# Patient Record
Sex: Male | Born: 1975 | Race: White | Hispanic: No | Marital: Married | Smoking: Current every day smoker
Health system: Southern US, Community
[De-identification: ages and names within clinical notes are randomized; demographics above are authoritative.]

## PROBLEM LIST (undated history)

## (undated) DIAGNOSIS — M7041 Prepatellar bursitis, right knee: Secondary | ICD-10-CM

## (undated) DIAGNOSIS — E119 Type 2 diabetes mellitus without complications: Secondary | ICD-10-CM

## (undated) DIAGNOSIS — D751 Secondary polycythemia: Secondary | ICD-10-CM

## (undated) DIAGNOSIS — K7011 Alcoholic hepatitis with ascites: Secondary | ICD-10-CM

## (undated) DIAGNOSIS — F1020 Alcohol dependence, uncomplicated: Secondary | ICD-10-CM

## (undated) HISTORY — PX: WISDOM TOOTH EXTRACTION: SHX21

## (undated) HISTORY — PX: TONSILLECTOMY AND ADENOIDECTOMY: SHX28

---

## 2001-06-09 ENCOUNTER — Encounter: Payer: Self-pay | Admitting: Emergency Medicine

## 2001-06-09 ENCOUNTER — Emergency Department (HOSPITAL_COMMUNITY): Admission: EM | Admit: 2001-06-09 | Discharge: 2001-06-09 | Payer: Self-pay | Admitting: Emergency Medicine

## 2010-02-20 ENCOUNTER — Encounter (INDEPENDENT_AMBULATORY_CARE_PROVIDER_SITE_OTHER): Payer: Self-pay | Admitting: *Deleted

## 2010-02-20 ENCOUNTER — Emergency Department (HOSPITAL_COMMUNITY)
Admission: EM | Admit: 2010-02-20 | Discharge: 2010-02-21 | Payer: Self-pay | Source: Home / Self Care | Admitting: Emergency Medicine

## 2010-02-22 ENCOUNTER — Encounter (INDEPENDENT_AMBULATORY_CARE_PROVIDER_SITE_OTHER): Payer: Self-pay | Admitting: *Deleted

## 2010-03-28 ENCOUNTER — Encounter (INDEPENDENT_AMBULATORY_CARE_PROVIDER_SITE_OTHER): Payer: Self-pay | Admitting: *Deleted

## 2010-03-31 ENCOUNTER — Encounter: Payer: Self-pay | Admitting: Gastroenterology

## 2010-03-31 ENCOUNTER — Ambulatory Visit
Admission: RE | Admit: 2010-03-31 | Discharge: 2010-03-31 | Payer: Self-pay | Source: Home / Self Care | Attending: Gastroenterology | Admitting: Gastroenterology

## 2010-03-31 DIAGNOSIS — E119 Type 2 diabetes mellitus without complications: Secondary | ICD-10-CM | POA: Insufficient documentation

## 2010-03-31 DIAGNOSIS — K219 Gastro-esophageal reflux disease without esophagitis: Secondary | ICD-10-CM | POA: Insufficient documentation

## 2010-03-31 DIAGNOSIS — R1013 Epigastric pain: Secondary | ICD-10-CM | POA: Insufficient documentation

## 2010-03-31 DIAGNOSIS — Z87442 Personal history of urinary calculi: Secondary | ICD-10-CM | POA: Insufficient documentation

## 2010-04-07 NOTE — Letter (Signed)
Summary: New Patient letter  [facility]  [address]   [place], [place] [address]   Phone: [phone]  Fax: [phone]       02/22/2010 MRN: [id number]  Tyler Clarke [address] [place], [place]  [address]  Dear Tyler Clarke,  Welcome to the Gastroenterology Division at [facility].    You are scheduled to see Dr.  Arlyce Dice on 03/31/2010 at 3:00 on the 3rd floor at [facility], [address]  We ask that you try to arrive at our office 15 minutes prior to your appointment time to allow for check-in.  We would like you to complete the enclosed self-administered evaluation form prior to your visit and bring it with you on the day of your appointment.  We will review it with you.  Also, please bring a complete list of all your medications or, if you prefer, bring the medication bottles and we will list them.  Please bring your insurance card so that we may make a copy of it.  If your insurance requires a referral to see a specialist, please bring your referral form from your primary care physician.  Co-payments are due at the time of your visit and may be paid by cash, check or credit card.     Your office visit will consist of a consult with your physician (includes a physical exam), any laboratory testing he/she may order, scheduling of any necessary diagnostic testing (e.g. x-ray, ultrasound, CT-scan), and scheduling of a procedure (e.g. Endoscopy, Colonoscopy) if required.  Please allow enough time on your schedule to allow for any/all of these possibilities.    If you cannot keep your appointment, please call [phone] to cancel or reschedule prior to your appointment date.  This allows [place] the opportunity to schedule an appointment for another patient in need of care.  If you do not cancel or reschedule by 5 p.m. the business day prior to your appointment date, you will be charged a $50.00 late cancellation/no-show fee.    Thank you for choosing  [facility] for your medical needs.  We appreciate the opportunity to care for you.  Please visit [place] at our website  to learn more about our practice.                     Sincerely,                                                             The Gastroenterology Division

## 2010-04-07 NOTE — Letter (Signed)
Summary: [facility]: Immunoassay Fecal Occult Blood (iFOB) Order [facility]  [address] [place], [place] [address]   Phone: [phone]  Fax: [phone]      [facility]: Immunoassay Fecal Occult Blood (iFOB) Order Form   March 31, 2010 MRN: [id number]   Tyler Clarke 09-Sep-1975   Physicican Name:Draken Farrior Diagnosis Code:789.06     Merri Ray CMA (AAMA)

## 2010-04-07 NOTE — Letter (Signed)
Summary: Results Letter  [facility]  [address] [place], [place] [address]   Phone: [phone]  Fax: [phone]        March 31, 2010 MRN: [id number]    Tyler Clarke [address] [place], [place]  [address]    Dear [name],  It is my pleasure to have treated you recently as a new patient in my office. I appreciate your confidence and the opportunity to participate in your care.  Since I do have a busy inpatient endoscopy schedule and office schedule, my office hours vary weekly. I am, however, available for emergency calls everyday through my office. If I am not available for an urgent office appointment, another one of our gastroenterologist will be able to assist you.  My well-trained staff are prepared to help you at all times. For emergencies after office hours, a physician from our Gastroenterology section is always available through my 24 hour answering service  Once again I welcome you as a new patient and I look forward to a happy and healthy relationship             Sincerely,  Tyler Meckel MD  This letter has been electronically signed by your physician.  Appended Document: Results Letter letter mailed

## 2010-04-13 NOTE — Assessment & Plan Note (Signed)
Summary: ABD PAIN ?ULCER.Tyler Clarke.   History of Present Illness Visit Type: new patient  Primary GI MD: Melvia Heaps MD [facility] Primary Provider: Blair Heys, MD  Requesting Provider: na Chief Complaint: Pt c/o epigastric abd pain, GERD, and vomiting blood  History of Present Illness:   Mr. Tyler Clarke is a pleasant 35 year old white male referred at the request of Dr. Manus Gunning for evaluation of abdominal pain and hematemesis.  Approximately a month ago he was seen at the ER  for moderately severe intermittent upper bowel pain.  Pain was  associated with nausea and vomiting.  He says that he was vomiting blood.  At that time had been taking Aleve at least twice a day.  Aleve was discontinued and he was placed on Prilosec.  Pain and vomiting have subsided.  He is now symptom-free.  He was having pyrosis as well.  Stools never changed in color.  There is no history of melena or hematochezia.   GI Review of Systems    Reports abdominal pain, acid reflux, heartburn, and  vomiting blood.     Location of  Abdominal pain: epigastric area.    Denies belching, bloating, chest pain, dysphagia with liquids, dysphagia with solids, loss of appetite, nausea, vomiting, weight loss, and  weight gain.        Denies anal fissure, black tarry stools, change in bowel habit, constipation, diarrhea, diverticulosis, fecal incontinence, heme positive stool, hemorrhoids, irritable bowel syndrome, jaundice, light color stool, liver problems, rectal bleeding, and  rectal pain.    Current Medications (verified): 1)  Prilosec Otc 20 Mg Tbec (Omeprazole Magnesium) .... 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  Alcohol Use - yes: 6 beers daily  Illicit Drug Use - no Smoking Status:  current Drug Use:  no  Review of Systems       The patient complains of coughing up blood.  The patient denies allergy/sinus, anemia, anxiety-new, arthritis/joint pain, back pain, blood in urine, breast changes/lumps, change in vision, confusion, cough, depression-new, fainting, fatigue, fever, headaches-new, hearing problems, heart murmur, heart rhythm changes, itching, menstrual pain, muscle pains/cramps, night sweats, nosebleeds, pregnancy symptoms, shortness of breath, skin rash, sleeping problems, sore throat, swelling of feet/legs, swollen lymph glands, thirst - excessive , urination - excessive , urination changes/pain, urine leakage, vision changes, and voice change.         All other systems were reviewed and were negative   Vital Signs:  Patient profile:   34 year old male Height:      74 inches Weight:      221 pounds BMI:     28.48 BSA:     2.27 Pulse rate:   92 / minute Pulse rhythm:   regular BP sitting:   136 / 84  (left arm) Cuff size:   regular  Vitals Entered By: Ok Anis CMA (March 31, 2010 3:15 PM)  Physical Exam  Additional Exam:  On physical exam he is a well-developed well-nourished male  skin: anicteric HEENT: normocephalic; PEERLA; no nasal or pharyngeal abnormalities neck: supple nodes: no cervical lymphadenopathy chest: clear to ausculatation and percussion heart: no murmurs, gallops, or rubs abd: soft, nontender; BS normoactive; no abdominal masses, organomegaly; there is minimal tenderness  in the upper midepigastrium rectal: deferred ext: no cynanosis, clubbing, edema skeletal: no deformities neuro: oriented x 3; no focal abnormalities    Impression & Recommendations:  Problem # 1:  EPIGASTRIC PAIN (ICD-789.06) Assessment Improved  Symptoms certainly may have been due to active peptic disease,  especially in view of his NSAID use.  Recommendations #1 complete 6 week  course of Prilosec #2 check stool for H. pylori antigen #3 check stool Hemoccults; if positive I will schedule upper endoscopy  Orders: T-Stool for Helicobacter Pylori ([id number])  Problem # 2:  GERD (ICD-530.81) He is symptom-free  on Prilosec.  After 6 weeks of therapy he will attempt to discontinue this.  Patient Instructions: 1)  Copy sent to : Blair Heys, MD  2)  You will go to the basement for labs 3)  The medication list was reviewed and reconciled.  All changed / newly prescribed medications were explained.  A complete medication list was provided to the patient / caregiver.

## 2010-05-16 LAB — CBC
HCT: 53.8 % — ABNORMAL HIGH (ref 39.0–52.0)
Hemoglobin: 19.6 g/dL — ABNORMAL HIGH (ref 13.0–17.0)
MCH: 36.5 pg — ABNORMAL HIGH (ref 26.0–34.0)
MCHC: 36.4 g/dL — ABNORMAL HIGH (ref 30.0–36.0)
MCV: 100.2 fL — ABNORMAL HIGH (ref 78.0–100.0)
Platelets: 183 10*3/uL (ref 150–400)
RBC: 5.37 MIL/uL (ref 4.22–5.81)
RDW: 11.7 % (ref 11.5–15.5)
WBC: 9.6 10*3/uL (ref 4.0–10.5)

## 2010-05-16 LAB — LIPASE, BLOOD: Lipase: 35 U/L (ref 11–59)

## 2010-05-16 LAB — COMPREHENSIVE METABOLIC PANEL
ALT: 48 U/L (ref 0–53)
AST: 53 U/L — ABNORMAL HIGH (ref 0–37)
Albumin: 3.7 g/dL (ref 3.5–5.2)
Chloride: 108 mEq/L (ref 96–112)
Creatinine, Ser: 0.79 mg/dL (ref 0.4–1.5)
GFR calc Af Amer: >60 mL/min (ref >60)
Sodium: 140 mEq/L (ref 135–145)
Total Bilirubin: 0.7 mg/dL (ref 0.3–1.2)

## 2010-05-16 LAB — DIFFERENTIAL
Basophils Absolute: 0 K/uL (ref 0.0–0.1)
Basophils Relative: 0 % (ref 0–1)
Eosinophils Absolute: 0.1 K/uL (ref 0.0–0.7)
Eosinophils Relative: 1 % (ref 0–5)
Lymphocytes Relative: 26 % (ref 12–46)
Lymphs Abs: 2.4 K/uL (ref 0.7–4.0)
Monocytes Absolute: 0.8 K/uL (ref 0.1–1.0)
Monocytes Relative: 8 % (ref 3–12)
Neutro Abs: 6.2 K/uL (ref 1.7–7.7)
Neutrophils Relative %: 65 % (ref 43–77)

## 2010-05-16 LAB — URINALYSIS, ROUTINE W REFLEX MICROSCOPIC
Bilirubin Urine: NEGATIVE
Glucose, UA: NEGATIVE mg/dL
Hgb urine dipstick: NEGATIVE
Ketones, ur: NEGATIVE mg/dL
Nitrite: NEGATIVE
Protein, ur: NEGATIVE mg/dL
Specific Gravity, Urine: 1.001 — ABNORMAL LOW (ref 1.005–1.030)
Urobilinogen, UA: 0.2 mg/dL (ref 0.0–1.0)
pH: 6.5 (ref 5.0–8.0)

## 2010-05-16 LAB — HEMOCCULT GUIAC POC 1CARD (OFFICE): Fecal Occult Bld: NEGATIVE

## 2010-06-03 ENCOUNTER — Other Ambulatory Visit: Payer: Self-pay

## 2010-06-03 DIAGNOSIS — R1013 Epigastric pain: Secondary | ICD-10-CM

## 2011-11-06 ENCOUNTER — Encounter (HOSPITAL_COMMUNITY): Payer: Self-pay | Admitting: Emergency Medicine

## 2011-11-06 ENCOUNTER — Emergency Department (HOSPITAL_COMMUNITY)
Admission: EM | Admit: 2011-11-06 | Discharge: 2011-11-06 | Disposition: A | Discharge status: Home / Self Care | Payer: Self-pay | Attending: Emergency Medicine | Admitting: Emergency Medicine

## 2011-11-06 ENCOUNTER — Emergency Department (HOSPITAL_COMMUNITY): Payer: Self-pay

## 2011-11-06 DIAGNOSIS — F17200 Nicotine dependence, unspecified, uncomplicated: Secondary | ICD-10-CM | POA: Insufficient documentation

## 2011-11-06 DIAGNOSIS — E119 Type 2 diabetes mellitus without complications: Secondary | ICD-10-CM | POA: Insufficient documentation

## 2011-11-06 DIAGNOSIS — M549 Dorsalgia, unspecified: Secondary | ICD-10-CM | POA: Insufficient documentation

## 2011-11-06 MED ORDER — CYCLOBENZAPRINE HCL 10 MG PO TABS: 10.0000 mg | ORAL_TABLET | Freq: Two times a day (BID) | ORAL | Status: DC | PRN

## 2011-11-06 MED ORDER — OXYCODONE-ACETAMINOPHEN 5-325 MG PO TABS
2.0000 | ORAL_TABLET | Freq: Once | ORAL | Status: AC
  Administered 2011-11-06: 2 via ORAL
  Filled 2011-11-06: qty 2

## 2011-11-06 MED ORDER — IBUPROFEN 400 MG PO TABS
800.0000 mg | ORAL_TABLET | Freq: Once | ORAL | Status: AC
  Administered 2011-11-06: 800 mg via ORAL
  Filled 2011-11-06: qty 2

## 2011-11-06 MED ORDER — OXYCODONE-ACETAMINOPHEN 5-325 MG PO TABS: 2.0000 | ORAL_TABLET | ORAL | Status: DC | PRN

## 2011-11-06 NOTE — ED Notes (Signed)
PT. REPORTS LOW BACK PAIN ONSET TODAY WHILE WORKING AND TWISTED HIS BACK , ALSO REPORTS CHRONIC LOW BACK PAIN DUE TO A FALL IN THE PAST.  STATES PAIN WORSE WITH MOVEMENT AND CERTAIN POSITIONS/ LEGS NUMB.

## 2011-11-06 NOTE — ED Provider Notes (Addendum)
History   This chart was scribed for Tyler Quarry, MD by Gerlean Ren. This patient was seen in room TR05C/TR05C and the patient's care was started at 10:02PM.   CSN: [id number]  Arrival date & time 11/06/11  2006   First MD Initiated Contact with Patient 11/06/11 2129      Chief Complaint  Patient presents with  . Back Pain    (Consider location/radiation/quality/duration/timing/severity/associated sxs/prior treatment) HPI Tyler Clarke is a 36 y.o. male who presents to the Emergency Department complaining of lower back pain that has worsened over past month.  Pt fell down flight of stairs one year ago, but pain has been manageable until past month.  Pain radiates down legs at times, but usually with movement.  Pt reports associated leg numbness associated with radiating pain.  Pain worsened by movement.  Pt takes ibuprofen with mild improvements.  Past Medical History  Diagnosis Date  . Diabetes mellitus     Past Surgical History  Procedure Date  . Tonsillectomy     No family history on file.  History  Substance Use Topics  . Smoking status: Current Everyday Smoker  . Smokeless tobacco: Not on file  . Alcohol Use: Yes      Review of Systems  Allergies  Review of patient's allergies indicates no known allergies.  Home Medications   Current Outpatient Rx  Name Route Sig Dispense Refill  . CYCLOBENZAPRINE HCL 10 MG PO TABS Oral Take 1 tablet (10 mg total) by mouth 2 (two) times daily as needed for muscle spasms. 20 tablet 0  . OXYCODONE-ACETAMINOPHEN 5-325 MG PO TABS Oral Take 2 tablets by mouth every 4 (four) hours as needed for pain. 15 tablet 0    BP 137/92  Pulse 91  Temp 98.3 F (36.8 C) (Oral)  Resp 16  SpO2 95%  Physical Exam  Nursing note and vitals reviewed. Constitutional: He is oriented to person, place, and time. He appears well-developed and well-nourished. No distress.  HENT:  Head: Normocephalic and atraumatic.  Eyes: EOM are normal.    Neck: Neck supple. No tracheal deviation present.  Cardiovascular: Normal rate.   Pulmonary/Chest: Effort normal. No respiratory distress.  Musculoskeletal: Normal range of motion.       Diffuse tenderness in lower lumbar spine.  No redness.  No swelling.  No deformity.  Neurological: He is alert and oriented to person, place, and time. He has normal reflexes. He exhibits normal muscle tone.       Extremity strength 5/5.    Skin: Skin is warm and dry.  Psychiatric: He has a normal mood and affect. His behavior is normal.    ED Course  Procedures (including critical care time) DIAGNOSTIC STUDIES: Oxygen Saturation is 95% on room air, adequate by my interpretation.    COORDINATION OF CARE:    Labs Reviewed - No data to display Dg Lumbar Spine Complete  11/06/2011  *RADIOLOGY REPORT*  Clinical Data: 37 year old male status post fall with back pain. Lower extremity numbness.  LUMBAR SPINE - COMPLETE 4+ VIEW  Comparison: CT abdomen and pelvis 02/20/2010.  Findings: Normal lumbar segmentation.  Stable vertebral height and alignment.  Chronic disc space narrowing at L1-L2 and anterior endplate spurring.  Other disc spaces are relatively preserved.  No pars fracture.  SI joints and sacral ala within normal limits. Bone mineralization is within normal limits.  IMPRESSION: Chronic L1-L2 disc degeneration. No acute osseous abnormality in the lumbar spine.   Original Report Authenticated By: H.LEE  HALL III, M.D.      1. Back pain     I personally performed the services described in this documentation, which was scribed in my presence. The recorded information has been reviewed and considered.   MDM      Patient with history of back injury one year ago with worsening symptoms today.  Patient without any acute focal neuro findings and plan pain management with skeletal muscle relaxants and pain meds.  Patient with history of diet controlled dm and will not add prednisone.  Patient referred to  Dr. Pearletha Forge for further management.   Patient with back pain.  No neurological deficits and normal neuro exam.  Patient can walk but states is painful.  No loss of bowel or bladder control.  No concern for cauda equina.  No fever, night sweats, weight loss, h/o cancer, IVDU.  RICE protocol and pain medicine indicated and discussed with patient.      Tyler Quarry, MD 11/06/11 1610  Tyler Quarry, MD 11/06/11 2257

## 2011-11-08 ENCOUNTER — Encounter: Payer: Self-pay | Admitting: Family Medicine

## 2011-11-08 ENCOUNTER — Ambulatory Visit (INDEPENDENT_AMBULATORY_CARE_PROVIDER_SITE_OTHER): Payer: Self-pay | Admitting: Family Medicine

## 2011-11-08 VITALS — BP 148/96 | HR 103 | Ht 74.0 in | Wt 200.0 lb

## 2011-11-08 DIAGNOSIS — M545 Low back pain: Secondary | ICD-10-CM

## 2011-11-08 MED ORDER — MELOXICAM 15 MG PO TABS: 15.0000 mg | ORAL_TABLET | Freq: Every day | ORAL | Status: DC

## 2011-11-08 MED ORDER — CYCLOBENZAPRINE HCL 10 MG PO TABS: 10.0000 mg | ORAL_TABLET | Freq: Three times a day (TID) | ORAL | Status: DC | PRN

## 2011-11-08 MED ORDER — PREDNISONE (PAK) 10 MG PO TABS: ORAL_TABLET | ORAL | Status: DC

## 2011-11-08 NOTE — Patient Instructions (Addendum)
You have lumbar radiculopathy (a pinched nerve in your low back). A prednisone dose pack is the best option for immediate relief and may be prescribed with transition to an anti-inflammatory like meloxicam daily with food. Percocet as needed for severe pain (no driving on this medicine). Flexeril as needed for muscle spasms (no driving on this medicine). Stay as active as possible. Physical therapy has been shown to be helpful as well. Strengthening of low back muscles, abdominal musculature are key for long term pain relief. If not improving, will consider further imaging (MRI).

## 2011-11-13 ENCOUNTER — Encounter: Payer: Self-pay | Admitting: Family Medicine

## 2011-11-13 DIAGNOSIS — M545 Low back pain, unspecified: Secondary | ICD-10-CM | POA: Insufficient documentation

## 2011-11-13 MED ORDER — CYCLOBENZAPRINE HCL 10 MG PO TABS: 10.0000 mg | ORAL_TABLET | Freq: Three times a day (TID) | ORAL | Status: DC | PRN

## 2011-11-13 NOTE — Assessment & Plan Note (Signed)
2/2 lumbar strain vs lumbar radiculopathy.  Trial prednisone dose pack with transition to mobic.  Flexeril as needed for spasms.  Percocet 7.5 #60 q6h prn severe pain.  PT, home exercise program.  F/u in 4-6 weeks for reevaluation, sooner if not improving as expected.

## 2011-11-13 NOTE — Progress Notes (Addendum)
  Subjective:    Patient ID: Tyler Clarke, male    DOB: 06/18/1975, 36 y.o.   MRN: [id number]  PCP: None listed  HPI 36 yo M here for low back pain.  Patient reports about 1 year ago he fell on wooden steps - hit one very hard with mid-low back. No prior back problems. Has had coming and going pain low back since then and more constant last week. Sometimes feels a knot in low back. Associated numbness in low back as well. Tried flexeril and percocet from ED. Icing. Not tried PT before. No bowel/bladder dysfunction. Some radiation into legs as well.  Past Medical History  Diagnosis Date  . Diabetes mellitus     No current outpatient prescriptions on file prior to visit.    Past Surgical History  Procedure Date  . Tonsillectomy     No Known Allergies  History   Social History  . Marital Status: Single    Spouse Name: N/A    Number of Children: N/A  . Years of Education: N/A   Occupational History  . Not on file.   Social History Main Topics  . Smoking status: Current Everyday Smoker -- 0.8 packs/day    Types: Cigarettes  . Smokeless tobacco: Not on file  . Alcohol Use: Yes  . Drug Use: No  . Sexually Active: Not on file   Other Topics Concern  . Not on file   Social History Narrative  . No narrative on file    Family History  Problem Relation Age of Onset  . Adopted: Yes  . Family history unknown: Yes    BP 148/96  Pulse 103  Ht 6\' 2"  (1.88 m)  Wt 200 lb (90.719 kg)  BMI 25.68 kg/m2  Review of Systems See HPI above.    Objective:   Physical Exam Gen: NAD  Back: No gross deformity, scoliosis. TTP bilateral lumbar paraspinal region.  No midline or bony TTP. FROM with pain on flexion. Strength LEs 5/5 all muscle groups.   2+ MSRs in patellar and achilles tendons, equal bilaterally. Pain in back with SLRs. Sensation intact to light touch bilaterally. Negative logroll bilateral hips Negative fabers and piriformis stretches.      Assessment & Plan:  1. Low back pain - 2/2 lumbar strain vs lumbar radiculopathy.  Trial prednisone dose pack with transition to mobic.  Flexeril as needed for spasms.  Percocet 7.5 #60 q6h prn severe pain.  PT, home exercise program.  F/u in 4-6 weeks for reevaluation, sooner if not improving as expected.  Addendum 10/4:  Called for refill on percocet and flexeril.  Advised to make appointment for follow-up.  Will not refill percocet - tramadol sent instead.  Flexeril as needed also sent.

## 2011-11-13 NOTE — Addendum Note (Signed)
Addended by: Lenda Kelp on: 11/13/2011 03:02 PM   Modules accepted: Orders

## 2011-12-08 MED ORDER — TRAMADOL HCL 50 MG PO TABS: 50.0000 mg | ORAL_TABLET | Freq: Three times a day (TID) | ORAL | Status: DC | PRN

## 2011-12-08 MED ORDER — CYCLOBENZAPRINE HCL 10 MG PO TABS: 10.0000 mg | ORAL_TABLET | Freq: Three times a day (TID) | ORAL | Status: DC | PRN

## 2011-12-08 NOTE — Addendum Note (Signed)
Addended by: Lenda Kelp on: 12/08/2011 12:32 PM   Modules accepted: Orders

## 2011-12-19 ENCOUNTER — Ambulatory Visit: Payer: Self-pay | Admitting: Family Medicine

## 2012-01-04 ENCOUNTER — Ambulatory Visit: Payer: Self-pay | Admitting: Family Medicine

## 2012-08-06 ENCOUNTER — Ambulatory Visit (INDEPENDENT_AMBULATORY_CARE_PROVIDER_SITE_OTHER): Payer: Self-pay | Admitting: Family Medicine

## 2012-08-06 ENCOUNTER — Encounter: Payer: Self-pay | Admitting: Family Medicine

## 2012-08-06 VITALS — BP 144/102 | HR 89 | Ht 75.0 in | Wt 230.0 lb

## 2012-08-06 DIAGNOSIS — M545 Low back pain, unspecified: Secondary | ICD-10-CM

## 2012-08-06 MED ORDER — PREDNISONE (PAK) 10 MG PO TABS: ORAL_TABLET | ORAL | Status: DC

## 2012-08-06 MED ORDER — CYCLOBENZAPRINE HCL 10 MG PO TABS: 10.0000 mg | ORAL_TABLET | Freq: Three times a day (TID) | ORAL | Status: DC | PRN

## 2012-08-06 MED ORDER — MELOXICAM 15 MG PO TABS: 15.0000 mg | ORAL_TABLET | Freq: Every day | ORAL | Status: DC

## 2012-08-06 MED ORDER — OXYCODONE-ACETAMINOPHEN 5-325 MG PO TABS: 1.0000 | ORAL_TABLET | Freq: Four times a day (QID) | ORAL | Status: DC | PRN

## 2012-08-06 NOTE — Patient Instructions (Addendum)
You have lumbar radiculopathy (a pinched nerve in your low back). A prednisone dose pack is the best option for immediate relief and may be prescribed with transition to an anti-inflammatory like meloxicam daily with food. Percocet as needed for severe pain (no driving on this medicine). Flexeril as needed for muscle spasms (no driving on this medicine if it makes you sleepy). Stay as active as possible. Strengthening of low back muscles, abdominal musculature are key for long term pain relief. Do home exercises daily. If not improving, will consider further imaging (MRI) and/or physical therapy - call me if this is the case a couple weeks from now.

## 2012-08-08 ENCOUNTER — Encounter: Payer: Self-pay | Admitting: Family Medicine

## 2012-08-08 NOTE — Progress Notes (Signed)
  Subjective:    Patient ID: Tyler Clarke, male    DOB: [birth year], 37 y.o.   MRN: [id number]  PCP: None listed  HPI  37 yo M here for f/u low back pain.  11/08/11: Patient reports about 1 year ago he fell on wooden steps - hit one very hard with mid-low back. No prior back problems. Has had coming and going pain low back since then and more constant last week. Sometimes feels a knot in low back. Associated numbness in low back as well. Tried flexeril and percocet from ED. Icing. Not tried PT before. No bowel/bladder dysfunction. Some radiation into legs as well.  08/06/12: Patient reports he has overall done well since last visit - took about 1 1/2 months but completely improved. Then over past 3-4 weeks has had worsening low back pain. 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 No actue injury. Has pain in center of low back that goes into right leg with associated numbness. Tried icyhot, ibuprofen, tramadol. No bowel/bladder dysfunction.  Past Medical History  Diagnosis Date  . Diabetes mellitus     Current Outpatient Prescriptions on File Prior to Visit  Medication Sig Dispense Refill  . traMADol (ULTRAM) 50 MG tablet Take 1 tablet (50 mg total) by mouth every 8 (eight) hours as needed for pain.  90 tablet  0   No current facility-administered medications on file prior to visit.    Past Surgical History  Procedure Laterality Date  . Tonsillectomy      No Known Allergies  History   Social History  . Marital Status: Single    Spouse Name: N/A    Number of Children: N/A  . Years of Education: N/A   Occupational History  . Not on file.   Social History Main Topics  . Smoking status: Current Every Day Smoker -- 0.80 packs/day    Types: Cigarettes  . Smokeless tobacco: Not on file  . Alcohol Use: Yes  . Drug Use: No  . Sexually Active: Not on file   Other Topics Concern  . Not on file   Social History Narrative  . No narrative  on file    Family History  Problem Relation Age of Onset  . Adopted: Yes    BP 144/102  Pulse 89  Ht 6\' 3"  (1.905 m)  Wt 230 lb (104.327 kg)  BMI 28.75 kg/m2  Review of Systems  See HPI above.    Objective:   Physical Exam  Gen: NAD  Back: No gross deformity, scoliosis. TTP bilateral lumbar paraspinal region R > L and in midline but no bony TTP. FROM with pain on flexion. Strength LEs 5/5 all muscle groups.   2+ MSRs in patellar and achilles tendons, equal bilaterally. Mild + SLR right, negative left. Sensation intact to light touch bilaterally. Negative logroll bilateral hips Negative fabers and piriformis stretches.    Assessment & Plan:  1. Low back pain - 2/2 lumbar strain vs lumbar radiculopathy.  Now having radiation into right leg with numbness.  Last visit's plan worked well for him so will again rx prednisone with transition to mobic.  Flexeril as needed for spasms.  Percocet 7.5 #60 q6h prn severe pain.  PT, home exercise program.  F/u in 4-6 weeks.

## 2012-08-08 NOTE — Assessment & Plan Note (Signed)
2/2 lumbar strain vs lumbar radiculopathy.  Now having radiation into right leg with numbness.  Last visit's plan worked well for him so will again rx prednisone with transition to mobic.  Flexeril as needed for spasms.  Percocet 7.5 #60 q6h prn severe pain.  PT, home exercise program.  F/u in 4-6 weeks.

## 2012-08-23 ENCOUNTER — Ambulatory Visit (INDEPENDENT_AMBULATORY_CARE_PROVIDER_SITE_OTHER): Payer: Self-pay | Admitting: Family Medicine

## 2012-08-23 ENCOUNTER — Encounter: Payer: Self-pay | Admitting: Family Medicine

## 2012-08-23 VITALS — BP 150/104 | HR 101 | Ht 75.0 in | Wt 220.0 lb

## 2012-08-23 DIAGNOSIS — M545 Low back pain, unspecified: Secondary | ICD-10-CM

## 2012-08-23 MED ORDER — OXYCODONE-ACETAMINOPHEN 5-325 MG PO TABS: 1.0000 | ORAL_TABLET | Freq: Four times a day (QID) | ORAL | Status: DC | PRN

## 2012-08-23 MED ORDER — PREDNISONE (PAK) 10 MG PO TABS: ORAL_TABLET | ORAL | Status: DC

## 2012-08-23 NOTE — Patient Instructions (Signed)
Take a longer course of prednisone for 12 days. Take percocet as needed for severe pain. We will call you the business day following the MRI to go over results and next steps.

## 2012-08-24 ENCOUNTER — Ambulatory Visit (HOSPITAL_BASED_OUTPATIENT_CLINIC_OR_DEPARTMENT_OTHER)
Admission: RE | Admit: 2012-08-24 | Discharge: 2012-08-24 | Disposition: A | Discharge status: Home / Self Care | Payer: Self-pay | Source: Ambulatory Visit | Attending: Family Medicine | Admitting: Family Medicine

## 2012-08-24 DIAGNOSIS — M79609 Pain in unspecified limb: Secondary | ICD-10-CM | POA: Insufficient documentation

## 2012-08-24 DIAGNOSIS — M545 Low back pain, unspecified: Secondary | ICD-10-CM | POA: Insufficient documentation

## 2012-08-24 DIAGNOSIS — M5126 Other intervertebral disc displacement, lumbar region: Secondary | ICD-10-CM | POA: Insufficient documentation

## 2012-08-24 DIAGNOSIS — M51379 Other intervertebral disc degeneration, lumbosacral region without mention of lumbar back pain or lower extremity pain: Secondary | ICD-10-CM | POA: Insufficient documentation

## 2012-08-24 DIAGNOSIS — M5137 Other intervertebral disc degeneration, lumbosacral region: Secondary | ICD-10-CM | POA: Insufficient documentation

## 2012-08-24 DIAGNOSIS — M129 Arthropathy, unspecified: Secondary | ICD-10-CM | POA: Insufficient documentation

## 2012-08-26 ENCOUNTER — Encounter: Payer: Self-pay | Admitting: Family Medicine

## 2012-08-26 NOTE — Addendum Note (Signed)
Addended by: Lenda Kelp on: 08/26/2012 02:38 PM   Modules accepted: Orders

## 2012-08-26 NOTE — Progress Notes (Addendum)
Subjective:    Patient ID: Tyler Clarke, male    DOB: March 21, 1975, 37 y.o.   MRN: [id number]  PCP: None listed  HPI  37 yo M here for f/u low back pain.  11/08/11: Patient reports about 1 year ago he fell on wooden steps - hit one very hard with mid-low back. No prior back problems. Has had coming and going pain low back since then and more constant last week. Sometimes feels a knot in low back. Associated numbness in low back as well. Tried flexeril and percocet from ED. Icing. Not tried PT before. No bowel/bladder dysfunction. Some radiation into legs as well.  08/06/12: Patient reports he has overall done well since last visit - took about 1 1/2 months but completely improved. Then over past 3-4 weeks has had worsening low back pain. 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 No actue injury. Has pain in center of low back that goes into right leg with associated numbness. Tried icyhot, ibuprofen, tramadol. No bowel/bladder dysfunction.  6/20: Patient reports despite medications his pain has persisted. States prednisone did help with his symptoms but they came back just as bad. Doing home exercises. Still taking percocet, meloxicam. Icing and heat. Has numbness going into right leg. No bowel/bladder dysfunction.  Past Medical History  Diagnosis Date  . Diabetes mellitus     Current Outpatient Prescriptions on File Prior to Visit  Medication Sig Dispense Refill  . meloxicam (MOBIC) 15 MG tablet Take 1 tablet (15 mg total) by mouth daily. With food.  Start AFTER finishing prednisone.  30 tablet  1  . traMADol (ULTRAM) 50 MG tablet Take 1 tablet (50 mg total) by mouth every 8 (eight) hours as needed for pain.  90 tablet  0   No current facility-administered medications on file prior to visit.    Past Surgical History  Procedure Laterality Date  . Tonsillectomy      No Known Allergies  History   Social History  . Marital Status:  Single    Spouse Name: N/A    Number of Children: N/A  . Years of Education: N/A   Occupational History  . Not on file.   Social History Main Topics  . Smoking status: Current Every Day Smoker -- 0.80 packs/day    Types: Cigarettes  . Smokeless tobacco: Not on file  . Alcohol Use: Yes  . Drug Use: No  . Sexually Active: Not on file   Other Topics Concern  . Not on file   Social History Narrative  . No narrative on file    Family History  Problem Relation Age of Onset  . Adopted: Yes    BP 150/104  Pulse 101  Ht 6\' 3"  (1.905 m)  Wt 220 lb (99.791 kg)  BMI 27.5 kg/m2  Review of Systems  See HPI above.    Objective:   Physical Exam  Gen: NAD  Back: No gross deformity, scoliosis. Mild TTP bilateral lumbar paraspinal region R > L and in midline but no bony TTP. FROM with pain on flexion and extension. Strength LEs 5/5 all muscle groups.   2+ MSRs in patellar and achilles tendons, equal bilaterally. Mild + SLR right, negative left. Sensation intact to light touch bilaterally. Negative logroll bilateral hips     Assessment & Plan:  1. Low back pain - consistent with lumbar radiculopathy.  Will burst longer course of prednisone.  Order MRI and consider ESIs if not improving  with longer prednisone course.  See instructions for further.  Addendum 6/23:  MRI results reviewed and discussed with patient - he does have a disc protrusion at L4-5 that appears to displace the right L4 nerve root - based on his exam feel this is most likely cause of his pain.  Will move forward with ESIs at this level.

## 2012-08-26 NOTE — Assessment & Plan Note (Signed)
consistent with lumbar radiculopathy.  Will burst longer course of prednisone.  Order MRI and consider ESIs if not improving with longer prednisone course.  See instructions for further.

## 2012-08-27 ENCOUNTER — Other Ambulatory Visit: Payer: Self-pay | Admitting: Family Medicine

## 2012-08-27 DIAGNOSIS — M545 Low back pain: Secondary | ICD-10-CM

## 2012-08-29 ENCOUNTER — Ambulatory Visit
Admission: RE | Admit: 2012-08-29 | Discharge: 2012-08-29 | Disposition: A | Discharge status: Home / Self Care | Payer: No Typology Code available for payment source | Source: Ambulatory Visit | Attending: Family Medicine | Admitting: Family Medicine

## 2012-08-29 VITALS — BP 143/87 | HR 90

## 2012-08-29 DIAGNOSIS — M545 Low back pain: Secondary | ICD-10-CM

## 2012-08-29 MED ORDER — IOHEXOL 180 MG/ML  SOLN
1.0000 mL | Freq: Once | INTRAMUSCULAR | Status: AC | PRN
  Administered 2012-08-29: 1 mL via EPIDURAL

## 2012-08-29 MED ORDER — METHYLPREDNISOLONE ACETATE 40 MG/ML INJ SUSP (RADIOLOG
120.0000 mg | Freq: Once | INTRAMUSCULAR | Status: AC
  Administered 2012-08-29: 120 mg via EPIDURAL

## 2012-09-16 ENCOUNTER — Ambulatory Visit (INDEPENDENT_AMBULATORY_CARE_PROVIDER_SITE_OTHER): Payer: Self-pay | Admitting: Family Medicine

## 2012-09-16 ENCOUNTER — Encounter: Payer: Self-pay | Admitting: Family Medicine

## 2012-09-16 VITALS — BP 159/112 | HR 114 | Ht 75.0 in | Wt 225.0 lb

## 2012-09-16 DIAGNOSIS — M545 Low back pain, unspecified: Secondary | ICD-10-CM

## 2012-09-16 MED ORDER — OXYCODONE-ACETAMINOPHEN 5-325 MG PO TABS: 1.0000 | ORAL_TABLET | Freq: Four times a day (QID) | ORAL | Status: AC | PRN

## 2012-09-16 MED ORDER — CYCLOBENZAPRINE HCL 10 MG PO TABS: 10.0000 mg | ORAL_TABLET | Freq: Three times a day (TID) | ORAL | Status: DC | PRN

## 2012-09-16 NOTE — Patient Instructions (Addendum)
Call us when you know you're coming back and we will set up the shot to be done the day afterwards. You can also ask them about the third and final shot of the series while you're there (you may not need [place] to send a referral again for a third shot). Flexeril and oxycodone as needed. Follow up will depend on how you do with the shots.

## 2012-09-16 NOTE — Assessment & Plan Note (Signed)
consistent with lumbar radiculopathy, confirmed by MRI at L4-5 level displacing right L4 nerve root.  Did well with ESI but did not last more than a week.  Will repeat this when he returns to town - advised to call us when he knows a return date and we'll set this up.  Refilled flexeril and percocet.

## 2012-09-16 NOTE — Progress Notes (Signed)
Subjective:    Patient ID: Tyler Clarke, male    DOB: 02-07-1976, 37 y.o.   MRN: [id number]  PCP: None listed  HPI  37 yo M here for f/u low back pain.  11/08/11: Patient reports about 1 year ago he fell on wooden steps - hit one very hard with mid-low back. No prior back problems. Has had coming and going pain low back since then and more constant last week. Sometimes feels a knot in low back. Associated numbness in low back as well. Tried flexeril and percocet from ED. Icing. Not tried PT before. No bowel/bladder dysfunction. Some radiation into legs as well.  08/06/12: Patient reports he has overall done well since last visit - took about 1 1/2 months but completely improved. Then over past 3-4 weeks has had worsening low back pain. 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 No actue injury. Has pain in center of low back that goes into right leg with associated numbness. Tried icyhot, ibuprofen, tramadol. No bowel/bladder dysfunction.  6/20: Patient reports despite medications his pain has persisted. States prednisone did help with his symptoms but they came back just as bad. Doing home exercises. Still taking percocet, meloxicam. Icing and heat. Has numbness going into right leg. No bowel/bladder dysfunction.  7/14: Patient reports ESI helped him significantly but only for 1 week. Feels like he's back to where he was initially. Numbness and radiation into right leg. Taking percocet, meloxicam.  Not much lasting relief with the two prednisone dose packs. Is going out of town for 2 weeks to work in [place] but will then be back. Has applied for and gotten [employer] that will kick in within a couple weeks. No bowel/bladder dysfunction.  Past Medical History  Diagnosis Date  . Diabetes mellitus     Current Outpatient Prescriptions on File Prior to Visit  Medication Sig Dispense Refill  . meloxicam (MOBIC) 15 MG tablet Take 1 tablet  (15 mg total) by mouth daily. With food.  Start AFTER finishing prednisone.  30 tablet  1  . predniSONE (STERAPRED UNI-PAK) 10 MG tablet 6 tabs po day 1-2, 5 tabs po day 3-4, 4 tabs po day 5-6, 3 tabs po day 7-8, 2 tabs po day 9-10, 1 tab po day 11-12  42 tablet  0   No current facility-administered medications on file prior to visit.    Past Surgical History  Procedure Laterality Date  . Tonsillectomy      No Known Allergies  History   Social History  . Marital Status: Single    Spouse Name: N/A    Number of Children: N/A  . Years of Education: N/A   Occupational History  . Not on file.   Social History Main Topics  . Smoking status: Current Every Day Smoker -- 0.50 packs/day    Types: Cigarettes  . Smokeless tobacco: Not on file  . Alcohol Use: Yes  . Drug Use: No  . Sexually Active: Not on file   Other Topics Concern  . Not on file   Social History Narrative  . No narrative on file    Family History  Problem Relation Age of Onset  . Adopted: Yes    BP 159/112  Pulse 114  Ht 6\' 3"  (1.905 m)  Wt 225 lb (102.059 kg)  BMI 28.12 kg/m2  Review of Systems  See HPI above.    Objective:   Physical Exam  Gen: NAD  Back: No  gross deformity, scoliosis. Mild TTP bilateral lumbar paraspinal region R > L and in midline but no bony TTP. FROM with pain on flexion and extension. Strength LEs 5/5 all muscle groups.   2+ MSRs in patellar and achilles tendons, equal bilaterally. Mild + SLR right, negative left. Sensation intact to light touch bilaterally. Negative logroll bilateral hips    Assessment & Plan:  1. Low back pain - consistent with lumbar radiculopathy, confirmed by MRI at L4-5 level displacing right L4 nerve root.  Did well with ESI but did not last more than a week.  Will repeat this when he returns to town - advised to call us when he knows a return date and we'll set this up.  Refilled flexeril and percocet.

## 2012-10-18 ENCOUNTER — Other Ambulatory Visit: Payer: Self-pay | Admitting: *Deleted

## 2012-10-18 ENCOUNTER — Telehealth: Payer: Self-pay | Admitting: Family Medicine

## 2012-10-18 ENCOUNTER — Other Ambulatory Visit: Payer: Self-pay | Admitting: Family Medicine

## 2012-10-18 DIAGNOSIS — M549 Dorsalgia, unspecified: Secondary | ICD-10-CM

## 2012-10-18 DIAGNOSIS — M545 Low back pain: Secondary | ICD-10-CM

## 2012-10-18 MED ORDER — HYDROCODONE-ACETAMINOPHEN 5-325 MG PO TABS: 1.0000 | ORAL_TABLET | Freq: Four times a day (QID) | ORAL | Status: DC | PRN

## 2012-10-18 NOTE — Progress Notes (Signed)
Pt referred for repeat ESI, vicodin sent to [facility] pharmacy per Dr. Pearletha Forge.  Pt notified that [facility] will call to set him up for ESI.

## 2012-10-25 ENCOUNTER — Inpatient Hospital Stay: Admission: RE | Admit: 2012-10-25 | Payer: Self-pay | Source: Ambulatory Visit

## 2012-10-31 ENCOUNTER — Ambulatory Visit
Admission: RE | Admit: 2012-10-31 | Discharge: 2012-10-31 | Disposition: A | Discharge status: Home / Self Care | Payer: No Typology Code available for payment source | Source: Ambulatory Visit | Attending: Family Medicine | Admitting: Family Medicine

## 2012-10-31 ENCOUNTER — Other Ambulatory Visit: Payer: Self-pay | Admitting: Family Medicine

## 2012-10-31 VITALS — BP 133/84 | HR 92

## 2012-10-31 DIAGNOSIS — M549 Dorsalgia, unspecified: Secondary | ICD-10-CM

## 2012-10-31 MED ORDER — METHYLPREDNISOLONE ACETATE 40 MG/ML INJ SUSP (RADIOLOG
120.0000 mg | Freq: Once | INTRAMUSCULAR | Status: AC
  Administered 2012-10-31: 120 mg via EPIDURAL

## 2012-10-31 MED ORDER — IOHEXOL 180 MG/ML  SOLN
1.0000 mL | Freq: Once | INTRAMUSCULAR | Status: AC | PRN
  Administered 2012-10-31: 1 mL via EPIDURAL

## 2012-11-14 ENCOUNTER — Other Ambulatory Visit: Payer: Self-pay | Admitting: *Deleted

## 2012-11-14 ENCOUNTER — Telehealth: Payer: Self-pay | Admitting: Family Medicine

## 2012-11-14 MED ORDER — TRAMADOL HCL 50 MG PO TABS: 50.0000 mg | ORAL_TABLET | Freq: Three times a day (TID) | ORAL | Status: DC | PRN

## 2012-11-14 NOTE — Telephone Encounter (Signed)
Would have to step down to tramadol from this - 1 tab every 8 hours as needed for severe pain, #90 with no refills.  Cannot refill after this one.

## 2013-02-04 ENCOUNTER — Ambulatory Visit (INDEPENDENT_AMBULATORY_CARE_PROVIDER_SITE_OTHER): Payer: 59 | Admitting: Family Medicine

## 2013-02-04 ENCOUNTER — Encounter: Payer: Self-pay | Admitting: Family Medicine

## 2013-02-04 VITALS — BP 144/81 | HR 108 | Ht 74.0 in | Wt 240.0 lb

## 2013-02-04 DIAGNOSIS — M545 Low back pain: Secondary | ICD-10-CM

## 2013-02-04 MED ORDER — CYCLOBENZAPRINE HCL 10 MG PO TABS: 10.0000 mg | ORAL_TABLET | Freq: Three times a day (TID) | ORAL | Status: AC | PRN

## 2013-02-04 MED ORDER — HYDROCODONE-ACETAMINOPHEN 5-325 MG PO TABS: 1.0000 | ORAL_TABLET | Freq: Four times a day (QID) | ORAL | Status: DC | PRN

## 2013-02-04 NOTE — Patient Instructions (Signed)
We will refer you to neurosurgery at this point.

## 2013-02-06 ENCOUNTER — Encounter: Payer: Self-pay | Admitting: Family Medicine

## 2013-02-06 NOTE — Assessment & Plan Note (Signed)
consistent with lumbar radiculopathy, confirmed by MRI at L4-5 level displacing right L4 nerve root.  Unfortunately continues to struggle with pain despite conservative measures - prednisone, exercise program, ESIs, tramadol, norco.  Advised at this point we go ahead with referral to neurosurgery to discuss possible microdiscectomy.  Refilled norco and flexeril.

## 2013-02-06 NOTE — Progress Notes (Signed)
Patient ID: RAYSHUN KANDLER, male   DOB: 02/01/1976, 37 y.o.   MRN: [id number]  Subjective:    Patient ID: MACEO HERNAN, male    DOB: [birth year], 37 y.o.   MRN: [id number]  PCP: None listed  Back Pain   37 yo M here for f/u low back pain.  11/08/11: Patient reports about 1 year ago he fell on wooden steps - hit one very hard with mid-low back. No prior back problems. Has had coming and going pain low back since then and more constant last week. Sometimes feels a knot in low back. Associated numbness in low back as well. Tried flexeril and percocet from ED. Icing. Not tried PT before. No bowel/bladder dysfunction. Some radiation into legs as well.  08/06/12: Patient reports he has overall done well since last visit - took about 1 1/2 months but completely improved. Then over past 3-4 weeks has had worsening low back pain. 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 No actue injury. Has pain in center of low back that goes into right leg with associated numbness. Tried icyhot, ibuprofen, tramadol. No bowel/bladder dysfunction.  6/20: Patient reports despite medications his pain has persisted. States prednisone did help with his symptoms but they came back just as bad. Doing home exercises. Still taking percocet, meloxicam. Icing and heat. Has numbness going into right leg. No bowel/bladder dysfunction.  7/14: Patient reports ESI helped him significantly but only for 1 week. Feels like he's back to where he was initially. Numbness and radiation into right leg. Taking percocet, meloxicam.  Not much lasting relief with the two prednisone dose packs. Is going out of town for 2 weeks to work in [place] but will then be back. Has applied for and gotten [employer] that will kick in within a couple weeks. No bowel/bladder dysfunction.  12/2: Patient reports second ESI also only worked for about 1 week. Pain worsening with the cold weather. Less  radiation into right leg than before however. Pain level is an 8/10. Taking ibuprofen and still doing HEP. No bowel/bladder dysfunction. Pain only in lower back currently.  Past Medical History  Diagnosis Date  . Diabetes mellitus     Current Outpatient Prescriptions on File Prior to Visit  Medication Sig Dispense Refill  . meloxicam (MOBIC) 15 MG tablet Take 1 tablet (15 mg total) by mouth daily. With food.  Start AFTER finishing prednisone.  30 tablet  1   No current facility-administered medications on file prior to visit.    Past Surgical History  Procedure Laterality Date  . Tonsillectomy      No Known Allergies  History   Social History  . Marital Status: Single    Spouse Name: N/A    Number of Children: N/A  . Years of Education: N/A   Occupational History  . Not on file.   Social History Main Topics  . Smoking status: Current Every Day Smoker -- 0.50 packs/day    Types: Cigarettes  . Smokeless tobacco: Not on file     Comment: 3-4 cigarettes per day  . Alcohol Use: Yes  . Drug Use: No  . Sexual Activity: Not on file   Other Topics Concern  . Not on file   Social History Narrative  . No narrative on file    Family History  Problem Relation Age of Onset  . Adopted: Yes    BP 144/81  Pulse 108  Ht 6\' 2"  (1.88 m)  Wt 240 lb (108.863 kg)  BMI 30.80 kg/m2  Review of Systems  Musculoskeletal: Positive for back pain.   See HPI above.    Objective:   Physical Exam Gen: NAD  Back: No gross deformity, scoliosis. Minimal TTP bilateral lumbar paraspinal region R > L and in midline but no bony TTP. FROM with pain on flexion and extension. Strength LEs 5/5 all muscle groups.   2+ MSRs in patellar and achilles tendons, equal bilaterally. Negative SLRs. Sensation intact to light touch bilaterally. Negative logroll bilateral hips    Assessment & Plan:  1. Low back pain - consistent with lumbar radiculopathy, confirmed by MRI at L4-5 level  displacing right L4 nerve root.  Unfortunately continues to struggle with pain despite conservative measures - prednisone, exercise program, ESIs, tramadol, norco.  Advised at this point we go ahead with referral to neurosurgery to discuss possible microdiscectomy.  Refilled norco and flexeril.

## 2013-03-17 ENCOUNTER — Ambulatory Visit: Payer: 59

## 2013-03-17 ENCOUNTER — Ambulatory Visit (INDEPENDENT_AMBULATORY_CARE_PROVIDER_SITE_OTHER): Payer: 59 | Admitting: Emergency Medicine

## 2013-03-17 VITALS — BP 130/90 | HR 92 | Temp 99.7°F | Resp 16 | Ht 75.0 in | Wt 229.0 lb

## 2013-03-17 DIAGNOSIS — S20212A Contusion of left front wall of thorax, initial encounter: Secondary | ICD-10-CM

## 2013-03-17 DIAGNOSIS — Z23 Encounter for immunization: Secondary | ICD-10-CM

## 2013-03-17 DIAGNOSIS — R0781 Pleurodynia: Secondary | ICD-10-CM

## 2013-03-17 DIAGNOSIS — S20219A Contusion of unspecified front wall of thorax, initial encounter: Secondary | ICD-10-CM

## 2013-03-17 DIAGNOSIS — R079 Chest pain, unspecified: Secondary | ICD-10-CM

## 2013-03-17 MED ORDER — HYDROCODONE-ACETAMINOPHEN 5-325 MG PO TABS: 1.0000 | ORAL_TABLET | Freq: Four times a day (QID) | ORAL | Status: DC | PRN

## 2013-03-17 NOTE — Patient Instructions (Signed)
Rib Contusion A rib contusion (bruise) can occur by a blow to the chest or by a fall against a hard object. Usually these will be much better in a couple weeks. If X-rays were taken today and there are no broken bones (fractures), the diagnosis of bruising is made. However, broken ribs may not show up for several days, or may be discovered later on a routine X-ray when signs of healing show up. If this happens to you, it does not mean that something was missed on the X-ray, but simply that it did not show up on the first X-rays. Earlier diagnosis will not usually change the treatment. HOME CARE INSTRUCTIONS   Avoid strenuous activity. Be careful during activities and avoid bumping the injured ribs. Activities that pull on the injured ribs and cause pain should be avoided, if possible.  For the first day or two, an ice pack used every 20 minutes while awake may be helpful. Put ice in a plastic bag and put a towel between the bag and the skin.  Eat a normal, well-balanced diet. Drink plenty of fluids to avoid constipation.  Take deep breaths several times a day to keep lungs free of infection. Try to cough several times a day. Splint the injured area with a pillow while coughing to ease pain. Coughing can help prevent pneumonia.  Wear a rib belt or binder only if told to do so by your caregiver. If you are wearing a rib belt or binder, you must do the breathing exercises as directed by your caregiver. If not used properly, rib belts or binders restrict breathing which can lead to pneumonia.  Only take over-the-counter or prescription medicines for pain, discomfort, or fever as directed by your caregiver. SEEK MEDICAL CARE IF:   You or your child has an oral temperature above 102 F (38.9 C).  Your baby is older than 3 months with a rectal temperature of 100.5 F (38.1 C) or higher for more than 1 day.  You develop a cough, with thick or bloody sputum. SEEK IMMEDIATE MEDICAL CARE IF:   You  have difficulty breathing.  You feel sick to your stomach (nausea), have vomiting or belly (abdominal) pain.  You have worsening pain, not controlled with medications, or there is a change in the location of the pain.  You develop sweating or radiation of the pain into the arms, jaw or shoulders, or become light headed or faint.  You or your child has an oral temperature above 102 F (38.9 C), not controlled by medicine.  Your or your baby is older than 3 months with a rectal temperature of 102 F (38.9 C) or higher.  Your baby is 3 months old or younger with a rectal temperature of 100.4 F (38 C) or higher. MAKE SURE YOU:   Understand these instructions.  Will watch your condition.  Will get help right away if you are not doing well or get worse. Document Released: 11/15/2000 Document Revised: 06/17/2012 Document Reviewed: 10/09/2007 [employer] Patient Information 2014 [employer].  

## 2013-03-17 NOTE — Progress Notes (Signed)
   Subjective:    Patient ID: Tyler Clarke, male    DOB: [birth year], 38 y.o.   MRN: [id number]  HPI Slipped and fell on stairs last night. Landed on LEFT side. Hurts worse when lying down flat, breathing in deeply, coughing and laughing. No pain when sitting still.  Any kind of movement (bending over, turning to side) causes pain.  Unable to sleep last night, could not get comfortable.  Has not noticed any bruising or swelling.  Taken ibuprofen 4-200 mg last night and 4-200 mg this morning with no improvement in pain.    Review of Systems     Objective:   Physical Exam  Constitutional: He is oriented to person, place, and time. Vital signs are normal. He appears well-developed and well-nourished.  Cardiovascular: Normal rate, regular rhythm and normal heart sounds.   Pulmonary/Chest: Effort normal and breath sounds normal. He exhibits tenderness. He exhibits no edema, no deformity and no swelling.    Neurological: He is alert and oriented to person, place, and time.  Psychiatric: He has a normal mood and affect.    Rib Films:  [facility] (Primary) Reading by Dr. Cleta Albertsaub: normal chest film. No pneumothorax. No pulmonary contusion or pleural effusion. No rib fractures.      Assessment & Plan:  1. Rib pain on left side - DG Ribs Unilateral W/Chest Left; Future - HYDROcodone-acetaminophen (NORCO) 5-325 MG per tablet; Take 1 tablet by mouth every 6 (six) hours as needed.  Dispense: 30 tablet; Refill: 0  2. Rib contusion, left, initial encounter  3. Need for Tdap vaccination - Tdap vaccine greater than or equal to 7yo IM

## 2013-03-17 NOTE — Progress Notes (Signed)
I have examined this patient along with the student and agree. Medications, allergies, past medical history, surgical history, family history, social history and problem list reviewed and updated.

## 2013-07-18 ENCOUNTER — Encounter (INDEPENDENT_AMBULATORY_CARE_PROVIDER_SITE_OTHER): Payer: Self-pay

## 2013-07-18 ENCOUNTER — Ambulatory Visit (INDEPENDENT_AMBULATORY_CARE_PROVIDER_SITE_OTHER): Payer: 59 | Admitting: Family Medicine

## 2013-07-18 ENCOUNTER — Encounter: Payer: Self-pay | Admitting: Family Medicine

## 2013-07-18 VITALS — BP 145/102 | HR 103 | Ht 74.0 in | Wt 230.0 lb

## 2013-07-18 DIAGNOSIS — M545 Low back pain, unspecified: Secondary | ICD-10-CM

## 2013-07-18 MED ORDER — TRAMADOL HCL 50 MG PO TABS: 50.0000 mg | ORAL_TABLET | Freq: Three times a day (TID) | ORAL | Status: DC | PRN

## 2013-07-18 MED ORDER — CYCLOBENZAPRINE HCL 10 MG PO TABS: 10.0000 mg | ORAL_TABLET | Freq: Three times a day (TID) | ORAL | Status: DC | PRN

## 2013-07-18 NOTE — Patient Instructions (Signed)
Take tramadol and flexeril as needed. Start physical therapy and home exercises. We will get the records from the surgeon to review. Call me if you haven't heard anything from us in a week.

## 2013-07-21 ENCOUNTER — Encounter: Payer: Self-pay | Admitting: Family Medicine

## 2013-07-21 DIAGNOSIS — M545 Low back pain, unspecified: Secondary | ICD-10-CM | POA: Insufficient documentation

## 2013-07-21 NOTE — Assessment & Plan Note (Signed)
consistent with lumbar radiculopathy though advised by neurosurgery pain unlikely from the L4-5 protrusion to the right.  Will obtain their records for review.  Tramadol, flexeril as needed.  Start physical therapy in light of their recommendations.

## 2013-07-21 NOTE — Progress Notes (Signed)
Patient ID: Tyler MtDavid E Clarke, male   DOB: [birth year], 38 y.o.   MRN: [id number]  Subjective:    Patient ID: Tyler Clarke, male    DOB: [birth year], 38 y.o.   MRN: [id number]  PCP: None listed  Back Pain   38 yo M here for f/u low back pain.  11/08/11: Patient reports about 1 year ago he fell on wooden steps - hit one very hard with mid-low back. No prior back problems. Has had coming and going pain low back since then and more constant last week. Sometimes feels a knot in low back. Associated numbness in low back as well. Tried flexeril and percocet from ED. Icing. Not tried PT before. No bowel/bladder dysfunction. Some radiation into legs as well.  08/06/12: Patient reports he has overall done well since last visit - took about 1 1/2 months but completely improved. Then over past 3-4 weeks has had worsening low back pain. 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 No actue injury. Has pain in center of low back that goes into right leg with associated numbness. Tried icyhot, ibuprofen, tramadol. No bowel/bladder dysfunction.  6/20: Patient reports despite medications his pain has persisted. States prednisone did help with his symptoms but they came back just as bad. Doing home exercises. Still taking percocet, meloxicam. Icing and heat. Has numbness going into right leg. No bowel/bladder dysfunction.  7/14: Patient reports ESI helped him significantly but only for 1 week. Feels like he's back to where he was initially. Numbness and radiation into right leg. Taking percocet, meloxicam.  Not much lasting relief with the two prednisone dose packs. Is going out of town for 2 weeks to work in [place] but will then be back. Has applied for and gotten [employer] insurance that will kick in within a couple weeks. No bowel/bladder dysfunction.  12/2: Patient reports second ESI also only worked for about 1 week. Pain worsening with the cold weather. Less  radiation into right leg than before however. Pain level is an 8/10. Taking ibuprofen and still doing HEP. No bowel/bladder dysfunction. Pain only in lower back currently.  07/18/13: Patient reports he has seen neurosurgery and advised surgery would not be beneficial in his case - they do not feel the L4-5 pathology is the cause of his symptoms. He had SI joint and ESI injections without improvement. No new injury or trauma. Had some mild improvement just in general but worse up to 9/10 past few weeks again. Radiation into right leg. No bowel/bladder dysfunction.  Past Medical History  Diagnosis Date  . Diabetes mellitus     Has not taken medications in 7-8 years   . Low back pain 11/13/2011  . GERD 03/31/2010    Qualifier: Diagnosis of  By: Katrinka BlazingSmith CMA, Tresa EndoKelly      No current outpatient prescriptions on file prior to visit.   No current facility-administered medications on file prior to visit.    Past Surgical History  Procedure Laterality Date  . Tonsillectomy      No Known Allergies  History   Social History  . Marital Status: Single    Spouse Name: N/A    Number of Children: N/A  . Years of Education: N/A   Occupational History  . Not on file.   Social History Main Topics  . Smoking status: Current Every Day Smoker -- 0.25 packs/day    Types: Cigarettes  . Smokeless tobacco: Never Used  . Alcohol Use: 4.8 oz/week  8 Cans of beer per week  . Drug Use: No  . Sexual Activity: Not on file   Other Topics Concern  . Not on file   Social History Narrative  . No narrative on file    Family History  Problem Relation Age of Onset  . Adopted: Yes    BP 145/102  Pulse 103  Ht 6\' 2"  (1.88 m)  Wt 230 lb (104.327 kg)  BMI 29.52 kg/m2  Review of Systems  Musculoskeletal: Positive for back pain.   See HPI above.    Objective:   Physical Exam Gen: NAD  Back: No gross deformity, scoliosis. Minimal TTP bilateral lumbar paraspinal region R > L and in  midline but no bony TTP. FROM with pain on flexion and extension. Strength LEs 5/5 all muscle groups.   2+ MSRs in patellar and achilles tendons, equal bilaterally. Negative SLRs. Sensation intact to light touch bilaterally. Negative logroll bilateral hips    Assessment & Plan:  1. Low back pain - consistent with lumbar radiculopathy though advised by neurosurgery pain unlikely from the L4-5 protrusion to the right.  Will obtain their records for review.  Tramadol, flexeril as needed.  Start physical therapy in light of their recommendations.

## 2013-07-22 ENCOUNTER — Other Ambulatory Visit: Payer: Self-pay | Admitting: *Deleted

## 2013-07-22 DIAGNOSIS — M545 Low back pain, unspecified: Secondary | ICD-10-CM

## 2013-07-23 ENCOUNTER — Encounter: Payer: Self-pay | Admitting: Family Medicine

## 2013-08-06 ENCOUNTER — Ambulatory Visit: Payer: 59 | Attending: Family Medicine

## 2013-12-27 ENCOUNTER — Emergency Department (HOSPITAL_COMMUNITY)
Admission: EM | Admit: 2013-12-27 | Discharge: 2013-12-28 | Disposition: A | Discharge status: Home / Self Care | Payer: 59 | Attending: Emergency Medicine | Admitting: Emergency Medicine

## 2013-12-27 DIAGNOSIS — Z720 Tobacco use: Secondary | ICD-10-CM | POA: Diagnosis not present

## 2013-12-27 DIAGNOSIS — Z8719 Personal history of other diseases of the digestive system: Secondary | ICD-10-CM | POA: Insufficient documentation

## 2013-12-27 DIAGNOSIS — R079 Chest pain, unspecified: Secondary | ICD-10-CM | POA: Diagnosis not present

## 2013-12-27 DIAGNOSIS — E119 Type 2 diabetes mellitus without complications: Secondary | ICD-10-CM | POA: Insufficient documentation

## 2013-12-27 DIAGNOSIS — R110 Nausea: Secondary | ICD-10-CM | POA: Insufficient documentation

## 2013-12-28 ENCOUNTER — Emergency Department (HOSPITAL_COMMUNITY): Payer: 59

## 2013-12-28 ENCOUNTER — Encounter (HOSPITAL_COMMUNITY): Payer: Self-pay | Admitting: Emergency Medicine

## 2013-12-28 LAB — COMPREHENSIVE METABOLIC PANEL
ALT: 46 U/L (ref 0–53)
AST: 39 U/L — ABNORMAL HIGH (ref 0–37)
Albumin: 3.5 g/dL (ref 3.5–5.2)
Alkaline Phosphatase: 63 U/L (ref 39–117)
Anion gap: 17 — ABNORMAL HIGH (ref 5–15)
BUN: 4 mg/dL — ABNORMAL LOW (ref 6–23)
CALCIUM: 9.1 mg/dL (ref 8.4–10.5)
CO2: 21 mEq/L (ref 19–32)
Chloride: 98 mEq/L (ref 96–112)
Creatinine, Ser: 0.55 mg/dL (ref 0.50–1.35)
GFR calc non Af Amer: >90 mL/min (ref >90)
GLUCOSE: 163 mg/dL — AB (ref 70–99)
Potassium: 3.6 mEq/L — ABNORMAL LOW (ref 3.7–5.3)
Sodium: 136 mEq/L — ABNORMAL LOW (ref 137–147)
TOTAL PROTEIN: 6.7 g/dL (ref 6.0–8.3)
Total Bilirubin: 0.6 mg/dL (ref 0.3–1.2)

## 2013-12-28 LAB — CBC
HCT: 52.2 % — ABNORMAL HIGH (ref 39.0–52.0)
HEMOGLOBIN: 19.1 g/dL — AB (ref 13.0–17.0)
MCH: 36 pg — AB (ref 26.0–34.0)
MCHC: 36.6 g/dL — AB (ref 30.0–36.0)
MCV: 98.3 fL (ref 78.0–100.0)
PLATELETS: 134 10*3/uL — AB (ref 150–400)
RBC: 5.31 MIL/uL (ref 4.22–5.81)
RDW: 11.7 % (ref 11.5–15.5)
WBC: 8.2 10*3/uL (ref 4.0–10.5)

## 2013-12-28 LAB — MAGNESIUM: MAGNESIUM: 2 mg/dL (ref 1.5–2.5)

## 2013-12-28 LAB — PROTIME-INR
INR: 1.05 (ref 0.00–1.49)
Prothrombin Time: 13.9 seconds (ref 11.6–15.2)

## 2013-12-28 LAB — TROPONIN I: Troponin I: <0.3 ng/mL (ref <0.30)

## 2013-12-28 LAB — PRO B NATRIURETIC PEPTIDE: PRO B NATRI PEPTIDE: 26 pg/mL (ref 0–125)

## 2013-12-28 MED ORDER — HYDROCODONE-ACETAMINOPHEN 5-325 MG PO TABS
1.0000 | ORAL_TABLET | Freq: Once | ORAL | Status: AC
  Administered 2013-12-28: 1 via ORAL
  Filled 2013-12-28: qty 1

## 2013-12-28 MED ORDER — OMEPRAZOLE 20 MG PO CPDR: 20.0000 mg | DELAYED_RELEASE_CAPSULE | Freq: Every day | ORAL | Status: DC

## 2013-12-28 MED ORDER — GI COCKTAIL ~~LOC~~
30.0000 mL | Freq: Once | ORAL | Status: AC
  Administered 2013-12-28: 30 mL via ORAL
  Filled 2013-12-28: qty 30

## 2013-12-28 MED ORDER — SUCRALFATE 1 G PO TABS: 1.0000 g | ORAL_TABLET | Freq: Four times a day (QID) | ORAL | Status: DC

## 2013-12-28 MED ORDER — MORPHINE SULFATE 4 MG/ML IJ SOLN
4.0000 mg | Freq: Once | INTRAMUSCULAR | Status: AC
  Administered 2013-12-28: 4 mg via INTRAVENOUS
  Filled 2013-12-28: qty 1

## 2013-12-28 MED ORDER — ASPIRIN 81 MG PO CHEW: 324.0000 mg | CHEWABLE_TABLET | Freq: Once | ORAL | Status: DC

## 2013-12-28 NOTE — ED Provider Notes (Signed)
CSN: [id number]     Arrival date & time 12/27/13  2359 History   First MD Initiated Contact with Patient 12/28/13 0015     Chief Complaint  Patient presents with  . Chest Pain     (Consider location/radiation/quality/duration/timing/severity/associated sxs/prior Treatment) HPI  Patient presents with acute onset left-sided chest pain.  The pain began approximately 1.5 hours ago.  Patient was eating when pain began.  There was associated nausea, which has improved with Zofran.  However, the pain has not changed substantially in spite of aspirin, nitroglycerin.  Pain is focally in the left lower chest, epigastrium, nonradiating.  Pain is not pleuritic or exertional.  There is no lightheadedness, syncope, other abdominal pain, other chest pain. Patient smokes, drinks.   Patient was counseled on smoking cessation.   Past Medical History  Diagnosis Date  . Diabetes mellitus     Has not taken medications in 7-8 years   . Low back pain 11/13/2011  . GERD 03/31/2010    Qualifier: Diagnosis of  By: Katrinka BlazingSmith CMA, Tresa EndoKelly     Past Surgical History  Procedure Laterality Date  . Tonsillectomy    . Tonsillectomy    . Adenoidectomy     Family History  Problem Relation Age of Onset  . Adopted: Yes   History  Substance Use Topics  . Smoking status: Current Every Day Smoker -- 0.25 packs/day    Types: Cigarettes  . Smokeless tobacco: Never Used  . Alcohol Use: 4.8 oz/week    8 Cans of beer per week    Review of Systems  Constitutional:       Per HPI, otherwise negative  HENT:       Per HPI, otherwise negative  Respiratory:       Per HPI, otherwise negative  Cardiovascular:       Per HPI, otherwise negative  Gastrointestinal: Negative for vomiting.  Endocrine:       Negative aside from HPI  Genitourinary:       Neg aside from HPI   Musculoskeletal:       Per HPI, otherwise negative  Skin: Negative.   Neurological: Negative for syncope.      Allergies  Review of patient's  allergies indicates no known allergies.  Home Medications   Prior to Admission medications   Medication Sig Start Date End Date Taking? Authorizing Provider  cyclobenzaprine (FLEXERIL) 10 MG tablet Take 1 tablet (10 mg total) by mouth every 8 (eight) hours as needed for muscle spasms. 07/18/13   Lenda KelpShane R Hudnall, MD  traMADol (ULTRAM) 50 MG tablet Take 1 tablet (50 mg total) by mouth every 8 (eight) hours as needed. 07/18/13   Lenda KelpShane R Hudnall, MD   There were no vitals taken for this visit. Physical Exam  Nursing note and vitals reviewed. Constitutional: He is oriented to person, place, and time. He appears well-developed. No distress.  HENT:  Head: Normocephalic and atraumatic.  Eyes: Conjunctivae and EOM are normal.  Cardiovascular: Normal rate and regular rhythm.   Pulmonary/Chest: Effort normal. No stridor. No respiratory distress.  Abdominal: He exhibits no distension.  Musculoskeletal: He exhibits no edema.  Neurological: He is alert and oriented to person, place, and time.  Skin: Skin is warm and dry.  Psychiatric: He has a normal mood and affect.    ED Course  Procedures (including critical care time) Labs Review Labs Reviewed  CBC - Abnormal; Notable for the following:    Hemoglobin 19.1 (*)    HCT 52.2 (*)  MCH 36.0 (*)    MCHC 36.6 (*)    Platelets 134 (*)    All other components within normal limits  COMPREHENSIVE METABOLIC PANEL - Abnormal; Notable for the following:    Sodium 136 (*)    Potassium 3.6 (*)    Glucose, Bld 163 (*)    BUN 4 (*)    AST 39 (*)    Anion gap 17 (*)    All other components within normal limits  PRO B NATRIURETIC PEPTIDE  MAGNESIUM  PROTIME-INR  TROPONIN I    Imaging Review Dg Chest 2 View  12/28/2013   CLINICAL DATA:  Epigastric pain and pressure radiating to left chest. No early with antacids and nitroglycerin. Diaphoretic, tachycardic, hypertensive.  EXAM: CHEST  2 VIEW  COMPARISON:  03/17/2013  FINDINGS: The heart size and  mediastinal contours are within normal limits. Both lungs are clear. The visualized skeletal structures are unremarkable.  IMPRESSION: No active cardiopulmonary disease.   Electronically Signed   By: Charlett NoseKevin  Dover M.D.   On: 12/28/2013 01:28     EKG Interpretation   Date/Time:  Sunday December 28 2013 00:17:25 EDT Ventricular Rate:  107 PR Interval:  163 QRS Duration: 90 QT Interval:  356 QTC Calculation: 475 R Axis:   62 Text Interpretation:  Sinus tachycardia Probable lateral infarct, old  Sinus tachycardia Artifact Abnormal ekg Confirmed by Gerhard MunchLOCKWOOD, Isabellah Sobocinski  MD  ([id number]) on 12/28/2013 12:19:40 AM     1:53 AM Patient comfortable on re-check. Pain diminished.   Trop #2 in one hour  4:36 AM Patient states that the pain has improved substantially. Second troponin is normal. On monitor the patient has a sinus rhythm, rate 70, unremarkable Pulse ox symmetry is 100% with room air normal  Patient has primary care visit scheduled in less than 36 hours MDM  Patient presents after an episode of left-sided abdominal and epigastric pain. Patient is awake, alert, in no distress.  Patient has a reassuring evaluation, with serial negative troponins, nonischemic EKG, and improved substantially here. Given his youth, his low risk profile and his reassuring findings, he was discharged in stable condition to follow-up with primary care in less than 2 days, as scheduled.    Gerhard Munchobert Westlee Devita, MD 12/28/13 [phone]

## 2013-12-28 NOTE — ED Notes (Signed)
Pt to xray

## 2013-12-28 NOTE — Discharge Instructions (Signed)
As discussed, your evaluation was largely reassuring tonight.  It is very important to follow up with your primary care provider in 2 days.  Your pain is likely due to inflammation of the stomach and/or esophagus.  However, it is important that you complete the evaluation for possible heart effects.  He should discuss this with your physician.  Return here for concerning changes in her condition.   Chest Pain (Nonspecific) It is often hard to give a specific diagnosis for the cause of chest pain. There is always a chance that your pain could be related to something serious, such as a heart attack or a blood clot in the lungs. You need to follow up with your health care provider for further evaluation. CAUSES   Heartburn.  Pneumonia or bronchitis.  Anxiety or stress.  Inflammation around your heart (pericarditis) or lung (pleuritis or pleurisy).  A blood clot in the lung.  A collapsed lung (pneumothorax). It can develop suddenly on its own (spontaneous pneumothorax) or from trauma to the chest.  Shingles infection (herpes zoster virus). The chest wall is composed of bones, muscles, and cartilage. Any of these can be the source of the pain.  The bones can be bruised by injury.  The muscles or cartilage can be strained by coughing or overwork.  The cartilage can be affected by inflammation and become sore (costochondritis). DIAGNOSIS  Lab tests or other studies may be needed to find the cause of your pain. Your health care provider may have you take a test called an ambulatory electrocardiogram (ECG). An ECG records your heartbeat patterns over a 24-hour period. You may also have other tests, such as:  Transthoracic echocardiogram (TTE). During echocardiography, sound waves are used to evaluate how blood flows through your heart.  Transesophageal echocardiogram (TEE).  Cardiac monitoring. This allows your health care provider to monitor your heart rate and rhythm in real  time.  Holter monitor. This is a portable device that records your heartbeat and can help diagnose heart arrhythmias. It allows your health care provider to track your heart activity for several days, if needed.  Stress tests by exercise or by giving medicine that makes the heart beat faster. TREATMENT   Treatment depends on what may be causing your chest pain. Treatment may include:  Acid blockers for heartburn.  Anti-inflammatory medicine.  Pain medicine for inflammatory conditions.  Antibiotics if an infection is present.  You may be advised to change lifestyle habits. This includes stopping smoking and avoiding alcohol, caffeine, and chocolate.  You may be advised to keep your head raised (elevated) when sleeping. This reduces the chance of acid going backward from your stomach into your esophagus. Most of the time, nonspecific chest pain will improve within 2-3 days with rest and mild pain medicine.  HOME CARE INSTRUCTIONS   If antibiotics were prescribed, take them as directed. Finish them even if you start to feel better.  For the next few days, avoid physical activities that bring on chest pain. Continue physical activities as directed.  Do not use any tobacco products, including cigarettes, chewing tobacco, or electronic cigarettes.  Avoid drinking alcohol.  Only take medicine as directed by your health care provider.  Follow your health care provider's suggestions for further testing if your chest pain does not go away.  Keep any follow-up appointments you made. If you do not go to an appointment, you could develop lasting (chronic) problems with pain. If there is any problem keeping an appointment, call to reschedule.  SEEK MEDICAL CARE IF:   Your chest pain does not go away, even after treatment.  You have a rash with blisters on your chest.  You have a fever. SEEK IMMEDIATE MEDICAL CARE IF:   You have increased chest pain or pain that spreads to your arm,  neck, jaw, back, or abdomen.  You have shortness of breath.  You have an increasing cough, or you cough up blood.  You have severe back or abdominal pain.  You feel nauseous or vomit.  You have severe weakness.  You faint.  You have chills. This is an emergency. Do not wait to see if the pain will go away. Get medical help at once. Call your local emergency services (911 in [place] Do not drive yourself to the hospital. MAKE SURE YOU:   Understand these instructions.  Will watch your condition.  Will get help right away if you are not doing well or get worse. Document Released: 11/30/2004 Document Revised: 02/25/2013 Document Reviewed: 09/26/2007 [facility] Patient Information 2015 [place], [place]. This information is not intended to replace advice given to you by your health care provider. Make sure you discuss any questions you have with your health care provider.

## 2013-12-28 NOTE — ED Notes (Signed)
Pt is from home. Pt states he was eating around 2300, when he started having epigastric pressure that radiated to his left chest, rating the pain 10/10. Pt took Tums with no relief. Pt received 3 NTG with no relief, and 4mg  of Zofran with relief of nausea. Pt also 324 ASA en route. Pt was diaphoretic, tachycardic and hypertensive upon EMS arrival.

## 2014-01-21 NOTE — Telephone Encounter (Signed)
Completed.

## 2014-02-06 ENCOUNTER — Telehealth: Payer: Self-pay | Admitting: Hematology and Oncology

## 2014-02-06 NOTE — Telephone Encounter (Signed)
S/W PATIENT AND GAVE NP APPT FOR 12/08 @ 10:30 W/DR. SHADAD.  REFERRING DR. Jeannetta NapELKINS DX- POLYCYTHEMIA

## 2014-02-10 ENCOUNTER — Encounter: Payer: Self-pay | Admitting: Oncology

## 2014-02-10 ENCOUNTER — Ambulatory Visit (HOSPITAL_BASED_OUTPATIENT_CLINIC_OR_DEPARTMENT_OTHER): Payer: 59 | Admitting: Oncology

## 2014-02-10 ENCOUNTER — Telehealth: Payer: Self-pay | Admitting: Oncology

## 2014-02-10 ENCOUNTER — Other Ambulatory Visit: Payer: 59

## 2014-02-10 ENCOUNTER — Ambulatory Visit: Payer: 59

## 2014-02-10 VITALS — BP 137/92 | HR 83 | Temp 98.2°F | Resp 20 | Ht 75.0 in | Wt 203.4 lb

## 2014-02-10 DIAGNOSIS — D751 Secondary polycythemia: Secondary | ICD-10-CM | POA: Diagnosis not present

## 2014-02-10 NOTE — Telephone Encounter (Signed)
Gave avs & cal for May. °

## 2014-02-10 NOTE — Consult Note (Signed)
Reason for Referral: Polycythemia.   HPI: 38 year old gentleman native of [place] where he lived the majority of his life. He is a gentleman with few comorbid conditions include obesity and diabetes. He also had low back pain with workup has been unrevealing. He was evaluated in the emergency department on 12/28/2013 for symptoms of chest pain. It was felt this probably related to GERD and reflux symptoms. His laboratory testing revealed that his hemoglobin was 19.1 with a normal white cell count of 8.2 and platelet count of 134. He was evaluated by his primary care physician and repeat testing confirmed elevated hemoglobin with low erythropoietin level. He was referred to me for evaluation of polycythemia. He does not report any similar episodes of elevated hemoglobin in the past. His hemoglobin on December 2011 was 19.6. He does report a history of obesity and of lost weight intentionally. He does report significant snoring but never been diagnosed with sleep apnea. 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 He is not taking any diuretics at this time. He does smoke and drink heavily at this time.  Clinically, he is currently asymptomatic. He does not report any headaches, blurry vision, double vision, syncope or seizures. He does not report any fevers, chills, sweats, weight loss or any constitutional symptoms. He is no longer reporting any chest pain and does not report any palpitation, orthopnea or leg edema. He does not report any cough or hemoptysis or hematemesis. He does not report any wheezing or difficulty breathing. He does not report any nausea, vomiting, constipation or diarrhea. He does not report any hematochezia or melena. He does not report any frequency urgency or hesitancy. He does report intermittent back pain but no skeletal complaints. He does not report any arthralgias or myalgias. Rest of his review of systems unremarkable.   Past Medical History  Diagnosis Date  .  Diabetes mellitus     Has not taken medications in 7-8 years   . Low back pain 11/13/2011  . GERD 03/31/2010    Qualifier: Diagnosis of  By: Katrinka BlazingSmith CMA, Tresa EndoKelly    :  Past Surgical History  Procedure Laterality Date  . Tonsillectomy    . Tonsillectomy    . Adenoidectomy    :  Current outpatient prescriptions: omeprazole (PRILOSEC) 20 MG capsule, Take 1 capsule (20 mg total) by mouth daily., Disp: 20 capsule, Rfl: 0:  No Known Allergies:  Family History  Problem Relation Age of Onset  . Adopted: Yes  :  History   Social History  . Marital Status: Single    Spouse Name: N/A    Number of Children: N/A  . Years of Education: N/A   Occupational History  . Not on file.   Social History Main Topics  . Smoking status: Current Every Day Smoker -- 0.25 packs/day    Types: Cigarettes  . Smokeless tobacco: Never Used  . Alcohol Use: 4.8 oz/week    8 Cans of beer per week  . Drug Use: No  . Sexual Activity: Not on file   Other Topics Concern  . Not on file   Social History Narrative  . No narrative on file  :  Pertinent items are noted in HPI.  Exam: Blood pressure 137/92, pulse 83, temperature 98.2 F (36.8 C), temperature source Oral, resp. rate 20, height 6\' 3"  (1.905 m), weight 203 lb 6.4 oz (92.262 kg). General appearance: alert and cooperative Head: Normocephalic, without obvious abnormality Throat: lips, mucosa, and tongue normal; teeth and  gums normal Neck: no adenopathy Back: negative Resp: clear to auscultation bilaterally Chest wall: no tenderness Cardio: regular rate and rhythm, S1, S2 normal, no murmur, click, rub or gallop GI: soft, non-tender; bowel sounds normal; no masses,  no organomegaly and The tip of his spleen was palpable today. Extremities: extremities normal, atraumatic, no cyanosis or edema Pulses: 2+ and symmetric Skin: Skin color, texture, turgor normal. No rashes or lesions Lymph nodes: Cervical, supraclavicular, and axillary nodes  normal.  CBC    Component Value Date/Time   WBC 8.2 12/28/2013 0021   RBC 5.31 12/28/2013 0021   HGB 19.1* 12/28/2013 0021   HCT 52.2* 12/28/2013 0021   PLT 134* 12/28/2013 0021   MCV 98.3 12/28/2013 0021   MCH 36.0* 12/28/2013 0021   MCHC 36.6* 12/28/2013 0021   RDW 11.7 12/28/2013 0021   LYMPHSABS 2.4 02/20/2010 1850   MONOABS 0.8 02/20/2010 1850   EOSABS 0.1 02/20/2010 1850   BASOSABS 0.0 02/20/2010 1850      Chemistry      Component Value Date/Time   NA 136* 12/28/2013 0021   K 3.6* 12/28/2013 0021   CL 98 12/28/2013 0021   CO2 21 12/28/2013 0021   BUN 4* 12/28/2013 0021   CREATININE 0.55 12/28/2013 0021      Component Value Date/Time   CALCIUM 9.1 12/28/2013 0021   ALKPHOS 63 12/28/2013 0021   AST 39* 12/28/2013 0021   ALT 46 12/28/2013 0021   BILITOT 0.6 12/28/2013 0021       Assessment and Plan:   38 year old gentleman with the following issues:  1. Polycythemia with elevated hemoglobin up to 19 that have been noted in October 2015 and previously in December 2011. The differential diagnosis was discussed with the patient today. Secondary causes that include smoking, dehydration, sleep apnea among others are the most likely etiologies. He is certainly at risk of polycythemia given these factors. Primary causes such as polycythemia vera should be considered given the chronicity of his symptoms also he might have a palpable spleen on examination. Direct this up completely, I will repeat his CBC and liver function tests. I will also obtain a JAK 2 mutation and ultrasound of the spleen to evaluate for splenomegaly.  From a management standpoint, he is asymptomatic but certainly could consider therapeutic phlebotomy in the future if he develops any symptoms.  2. Thrombosis risk: I think he will be a good candidate for low-dose aspirin 81 mg daily for thrombosis prophylaxis.  3. Polysubstance abuse: I believe a lot of that is contributing to his erythrocytosis and  potentially his liver disease that could be causing splenomegaly. I he counseled him about smoking and alcohol cessation.  Follow-up will be in 5 months after repeat testing as well as an ultrasound.

## 2014-02-10 NOTE — Progress Notes (Signed)
Checked in new pt with no financial concerns at this time.  Pt is here for a hematology concern so financial assistance may not be needed but he has my card for any questions or concerns. ° °

## 2014-02-10 NOTE — Progress Notes (Signed)
Please see consult note.  

## 2014-06-05 DIAGNOSIS — M7041 Prepatellar bursitis, right knee: Secondary | ICD-10-CM

## 2014-06-05 HISTORY — DX: Prepatellar bursitis, right knee: M70.41

## 2014-06-19 ENCOUNTER — Encounter (HOSPITAL_BASED_OUTPATIENT_CLINIC_OR_DEPARTMENT_OTHER): Payer: Self-pay | Admitting: *Deleted

## 2014-06-19 ENCOUNTER — Other Ambulatory Visit: Payer: Self-pay | Admitting: Physician Assistant

## 2014-06-23 NOTE — H&P (Signed)
Tyler Clarke/[facility] [address]   [address] [place], [place] [phone] A Division of [facility]  Loreta Aveaniel F. Azyriah Nevins, M.D.   Robert A. Thurston HoleWainer, M.D.   Burnell BlanksW. Dan Caffrey, M.D.   Eulas PostJoshua P. Landau, M.D.   Lunette StandsAnna Voytek, M.D Jewel Baizeimothy D. Eulah PontMurphy, M.D.  Buford DresserWesley R. Ibazebo, M.D.  Estell HarpinJames S. Kramer, M.D.    Melina Fiddlerebecca S. Bassett, M.D. Janalee DaneBrittney Kelly, PA- C  Mary L. Dub MikesStanbery, PA-C  Kirstin A. Shepperson, PA-C  Josh Twin Rivershadwell, PA-C  [place]andon Parry, [place]A-C   RE: Birdena JubileeWatts, Sharon                                [phone]      DOB: 05/23/1975 INITIAL EVALUATION:  05-15-14 Onalee HuaDavid comes in for an issue with his right knee.  New patient to the office.  He was trying to move a refrigerator awkwardly.  Using his right knee as a lever.  With a flexed twisting position he tried to move the refrigerator.  He felt something tear and pop on the medial side.  With manipulation he was able to pop this back in place.  Swelling, soreness and pain since.  Still limping.  Very uncomfortable.  Marked mechanical event which by his description sounds like a meniscus tear that displaced.  No previous significant issues with this knee.  He comes in for evaluation and treatment recommendation.  He works as a Scientist, physiologicalgolf mechanic.   His history and general exam is outlined and included in the chart.    EXAMINATION: Specifically, healthy appearing 39 year-old.  Height: 6?1.  Weight: 204 pounds.  Markedly antalgic gait on the right.  I can get the right knee through full motion.  It feels like his ACL, MCL, LCL and PCL are intact.  Markedly tender medial joint line.  Positive McMurray's.  Does not like going into flexion or complete extension.  2+ effusion.  Extensor mechanism is intact.  Neurovascularly intact distally.  Opposite left knee has no swelling, full motion, good stability and no tenderness.    X-RAYS: Four view standing x-ray shows no acute fractures.  No degenerative change.     DISPOSITION:  Based on the marked event and mechanical symptoms I think he has a meniscus tear.  In that regard we are going to get an MRI to look at his structures.  He is going to call me when that is complete.  If in fact this shows what we expect we are going to proceed with arthroscopic assessment and debridement.  That procedure, risks, benefits and complications all thoroughly discussed with him.  Anticipated rehab and recovery also thoroughly outlined.  Again, final decision after we see his scan.  Cautious activity in the interim.    Loreta Aveaniel F. Llana Deshazo, M.D.   Electronically verified by Loreta Aveaniel F. Leverne Amrhein, M.D. DFM:jjh Cc: Dr. Windle GuardWilson Elkins Fax: [phone] D 05-16-14 T 05-17-14  Legacie Dillingham/[facility] [address]   [address] [place], [place] [phone] A Division of [facility]  Loreta Aveaniel F. Mandel Seiden, M.D.   Robert A. Thurston HoleWainer, M.D.   Burnell BlanksW. Dan Caffrey, M.D.   Eulas PostJoshua P. Landau, M.D.   Lunette StandsAnna Voytek, M.D Jewel Baizeimothy D. Eulah PontMurphy, M.D.  Buford DresserWesley R. Ibazebo, M.D.  Estell HarpinJames S. Kramer, M.D.    Melina Fiddlerebecca S. Bassett, M.D. Janalee DaneBrittney Kelly, PA- C  Mary L. Dub MikesStanbery, PA-C  Kirstin A. Shepperson, PA-C  Josh [place],  PA-C  Janace Litten, OPA-C   RE: [place], [place]                                [phone]      DOB: 22-Jun-1975 PROGRESS NOTE: 06-16-14 Tyler Clarke is a pleasant 39 year-old gentleman who presents to our clinic today for continued right knee traumatic prepatellar bursitis.  We initially had obtained an MRI for a probable meniscus tear, however it was only significant for the prepatellar bursitis.  We saw Tyler Clarke last week where this was aspirated and injected.  Since that time Tyler Clarke has had decreased swelling, but continued pain to the prepatellar bursa.  He does a lot of squatting and kneeling on his knees and this has continued to be quite bothersome.  The injection we did last week really only gave him relief with Marcaine in place.   Past medical,  social and family history reviewed in detail on the patient questionnaire and signed.  Review of systems: As detailed in HPI.  All others reviewed and are negative.   EXAMINATION: Well-developed, well-nourished male in no acute distress.  Alert and oriented x 3.  Examination of his right knee reveals range of motion from 0-125 degrees.  No joint line tenderness.  He does have minimal swelling over the prepatellar bursa.  No erythema or signs of infection.    IMPRESSION: Traumatic prepatellar bursitis, right knee.    PLAN: At this point in time we feel it is appropriate to proceed with surgical incision of the prepatellar bursa, right knee.  Risks, benefits and possible complications of surgery have been reviewed.  Rehab and recovery time discussed. All questions answered.  Paperwork complete.  We will see Tyler Clarke at the time of operative intervention.    Loreta Ave, M.D.   Dictated by: Tessa Lerner, PA-C Electronically verified by Loreta Ave, M.D. DFM(LS):jjh D 06-17-14 T 06-18-14

## 2014-06-25 ENCOUNTER — Ambulatory Visit (HOSPITAL_BASED_OUTPATIENT_CLINIC_OR_DEPARTMENT_OTHER)
Admission: RE | Admit: 2014-06-25 | Discharge: 2014-06-25 | Disposition: A | Discharge status: Home / Self Care | Payer: 59 | Source: Ambulatory Visit | Attending: Orthopedic Surgery | Admitting: Orthopedic Surgery

## 2014-06-25 ENCOUNTER — Encounter (HOSPITAL_BASED_OUTPATIENT_CLINIC_OR_DEPARTMENT_OTHER): Payer: Self-pay | Admitting: Anesthesiology

## 2014-06-25 ENCOUNTER — Ambulatory Visit (HOSPITAL_BASED_OUTPATIENT_CLINIC_OR_DEPARTMENT_OTHER): Payer: 59 | Admitting: Anesthesiology

## 2014-06-25 ENCOUNTER — Encounter (HOSPITAL_BASED_OUTPATIENT_CLINIC_OR_DEPARTMENT_OTHER)
Admission: RE | Disposition: A | Discharge status: Home / Self Care | Payer: Self-pay | Source: Ambulatory Visit | Attending: Orthopedic Surgery

## 2014-06-25 DIAGNOSIS — M7041 Prepatellar bursitis, right knee: Secondary | ICD-10-CM | POA: Insufficient documentation

## 2014-06-25 DIAGNOSIS — F17200 Nicotine dependence, unspecified, uncomplicated: Secondary | ICD-10-CM | POA: Insufficient documentation

## 2014-06-25 DIAGNOSIS — E119 Type 2 diabetes mellitus without complications: Secondary | ICD-10-CM | POA: Diagnosis not present

## 2014-06-25 HISTORY — DX: Prepatellar bursitis, right knee: M70.41

## 2014-06-25 HISTORY — DX: Secondary polycythemia: D75.1

## 2014-06-25 HISTORY — PX: KNEE BURSECTOMY: SHX5882

## 2014-06-25 HISTORY — DX: Type 2 diabetes mellitus without complications: E11.9

## 2014-06-25 LAB — POCT HEMOGLOBIN-HEMACUE: Hemoglobin: 17.3 g/dL — ABNORMAL HIGH (ref 13.0–17.0)

## 2014-06-25 SURGERY — BURSECTOMY, KNEE
Anesthesia: General | Site: Knee | Laterality: Right

## 2014-06-25 MED ORDER — METOCLOPRAMIDE HCL 5 MG PO TABS: 5.0000 mg | ORAL_TABLET | Freq: Three times a day (TID) | ORAL | Status: DC | PRN

## 2014-06-25 MED ORDER — ONDANSETRON HCL 4 MG PO TABS: 4.0000 mg | ORAL_TABLET | Freq: Three times a day (TID) | ORAL | Status: DC | PRN

## 2014-06-25 MED ORDER — BUPIVACAINE HCL (PF) 0.5 % IJ SOLN
INTRAMUSCULAR | Status: DC | PRN
  Administered 2014-06-25: 10 mL

## 2014-06-25 MED ORDER — LIDOCAINE HCL (CARDIAC) 20 MG/ML IV SOLN
INTRAVENOUS | Status: DC | PRN
  Administered 2014-06-25: 100 mg via INTRAVENOUS

## 2014-06-25 MED ORDER — MIDAZOLAM HCL 2 MG/2ML IJ SOLN
INTRAMUSCULAR | Status: AC
  Filled 2014-06-25: qty 2

## 2014-06-25 MED ORDER — OXYCODONE-ACETAMINOPHEN 5-325 MG PO TABS: 1.0000 | ORAL_TABLET | ORAL | Status: DC | PRN

## 2014-06-25 MED ORDER — LACTATED RINGERS IV SOLN
INTRAVENOUS | Status: DC
  Administered 2014-06-25 (×2): via INTRAVENOUS

## 2014-06-25 MED ORDER — LACTATED RINGERS IV SOLN: INTRAVENOUS | Status: DC

## 2014-06-25 MED ORDER — PROPOFOL 10 MG/ML IV BOLUS
INTRAVENOUS | Status: AC
  Filled 2014-06-25: qty 20

## 2014-06-25 MED ORDER — HYDROMORPHONE HCL 1 MG/ML IJ SOLN
0.5000 mg | INTRAMUSCULAR | Status: DC | PRN
  Administered 2014-06-25 (×2): 0.5 mg via INTRAVENOUS

## 2014-06-25 MED ORDER — MIDAZOLAM HCL 2 MG/2ML IJ SOLN
1.0000 mg | INTRAMUSCULAR | Status: DC | PRN
  Administered 2014-06-25: 2 mg via INTRAVENOUS

## 2014-06-25 MED ORDER — CHLORHEXIDINE GLUCONATE 4 % EX LIQD: 60.0000 mL | Freq: Once | CUTANEOUS | Status: DC

## 2014-06-25 MED ORDER — FENTANYL CITRATE (PF) 100 MCG/2ML IJ SOLN
INTRAMUSCULAR | Status: AC
  Filled 2014-06-25: qty 6

## 2014-06-25 MED ORDER — FENTANYL CITRATE (PF) 100 MCG/2ML IJ SOLN
INTRAMUSCULAR | Status: DC | PRN
  Administered 2014-06-25: 50 ug via INTRAVENOUS
  Administered 2014-06-25: 100 ug via INTRAVENOUS
  Administered 2014-06-25: 50 ug via INTRAVENOUS

## 2014-06-25 MED ORDER — ONDANSETRON HCL 4 MG/2ML IJ SOLN
INTRAMUSCULAR | Status: DC | PRN
  Administered 2014-06-25: 4 mg via INTRAVENOUS

## 2014-06-25 MED ORDER — ONDANSETRON HCL 4 MG/2ML IJ SOLN: 4.0000 mg | Freq: Four times a day (QID) | INTRAMUSCULAR | Status: DC | PRN

## 2014-06-25 MED ORDER — CEFAZOLIN SODIUM-DEXTROSE 2-3 GM-% IV SOLR
INTRAVENOUS | Status: AC
Start: 2014-06-25 — End: 2014-06-25
  Filled 2014-06-25: qty 50

## 2014-06-25 MED ORDER — METOCLOPRAMIDE HCL 5 MG/ML IJ SOLN: 5.0000 mg | Freq: Three times a day (TID) | INTRAMUSCULAR | Status: DC | PRN

## 2014-06-25 MED ORDER — FENTANYL CITRATE (PF) 100 MCG/2ML IJ SOLN
INTRAMUSCULAR | Status: AC
  Filled 2014-06-25: qty 4

## 2014-06-25 MED ORDER — PROPOFOL 10 MG/ML IV BOLUS
INTRAVENOUS | Status: DC | PRN
  Administered 2014-06-25: 200 mg via INTRAVENOUS

## 2014-06-25 MED ORDER — CEFAZOLIN SODIUM-DEXTROSE 2-3 GM-% IV SOLR
2.0000 g | INTRAVENOUS | Status: AC
  Administered 2014-06-25: 2 g via INTRAVENOUS

## 2014-06-25 MED ORDER — ONDANSETRON HCL 4 MG PO TABS: 4.0000 mg | ORAL_TABLET | Freq: Four times a day (QID) | ORAL | Status: DC | PRN

## 2014-06-25 MED ORDER — DEXAMETHASONE SODIUM PHOSPHATE 10 MG/ML IJ SOLN
INTRAMUSCULAR | Status: DC | PRN
  Administered 2014-06-25: 10 mg via INTRAVENOUS

## 2014-06-25 MED ORDER — HYDROMORPHONE HCL 1 MG/ML IJ SOLN
INTRAMUSCULAR | Status: AC
  Filled 2014-06-25: qty 1

## 2014-06-25 SURGICAL SUPPLY — 59 items
APL SKNCLS STERI-STRIP NONHPOA (GAUZE/BANDAGES/DRESSINGS) ×2
BANDAGE ELASTIC 3 VELCRO ST LF (GAUZE/BANDAGES/DRESSINGS) IMPLANT
BANDAGE ELASTIC 6 VELCRO ST LF (GAUZE/BANDAGES/DRESSINGS) ×3 IMPLANT
BENZOIN TINCTURE PRP APPL 2/3 (GAUZE/BANDAGES/DRESSINGS) ×3 IMPLANT
BLADE MINI RND TIP GREEN BEAV (BLADE) IMPLANT
BLADE SURG 15 STRL LF DISP TIS (BLADE) ×2 IMPLANT
BLADE SURG 15 STRL SS (BLADE) ×4
BNDG CMPR 9X4 STRL LF SNTH (GAUZE/BANDAGES/DRESSINGS)
BNDG CMPR MD 5X2 ELC HKLP STRL (GAUZE/BANDAGES/DRESSINGS)
BNDG COHESIVE 4X5 TAN STRL (GAUZE/BANDAGES/DRESSINGS) ×1 IMPLANT
BNDG ELASTIC 2 VLCR STRL LF (GAUZE/BANDAGES/DRESSINGS) IMPLANT
BNDG ESMARK 4X9 LF (GAUZE/BANDAGES/DRESSINGS) IMPLANT
CLOSURE WOUND 1/2 X4 (GAUZE/BANDAGES/DRESSINGS) ×1
CORDS BIPOLAR (ELECTRODE) ×1 IMPLANT
COVER BACK TABLE 60X90IN (DRAPES) ×4 IMPLANT
COVER MAYO STAND STRL (DRAPES) ×1 IMPLANT
CUFF TOURNIQUET SINGLE 18IN (TOURNIQUET CUFF) IMPLANT
CUFF TOURNIQUET SINGLE 34IN LL (TOURNIQUET CUFF) ×3 IMPLANT
DECANTER SPIKE VIAL GLASS SM (MISCELLANEOUS) ×1 IMPLANT
DRAPE EXTREMITY T 121X128X90 (DRAPE) ×4 IMPLANT
DRSG PAD ABDOMINAL 8X10 ST (GAUZE/BANDAGES/DRESSINGS) IMPLANT
DURAPREP 26ML APPLICATOR (WOUND CARE) ×4 IMPLANT
ELECT REM PT RETURN 9FT ADLT (ELECTROSURGICAL) ×4
ELECTRODE REM PT RTRN 9FT ADLT (ELECTROSURGICAL) ×1 IMPLANT
GAUZE SPONGE 4X4 12PLY STRL (GAUZE/BANDAGES/DRESSINGS) ×4 IMPLANT
GAUZE XEROFORM 1X8 LF (GAUZE/BANDAGES/DRESSINGS) ×4 IMPLANT
GLOVE BIOGEL PI IND STRL 7.0 (GLOVE) ×2 IMPLANT
GLOVE BIOGEL PI INDICATOR 7.0 (GLOVE) ×2
GLOVE ECLIPSE 7.0 STRL STRAW (GLOVE) ×4 IMPLANT
GLOVE ORTHO TXT STRL SZ7.5 (GLOVE) ×5 IMPLANT
GLOVE SURG ORTHO 8.0 STRL STRW (GLOVE) ×4 IMPLANT
GLOVE SURG SS PI 7.0 STRL IVOR (GLOVE) ×3 IMPLANT
GOWN STRL REUS W/ TWL LRG LVL3 (GOWN DISPOSABLE) ×4 IMPLANT
GOWN STRL REUS W/ TWL XL LVL3 (GOWN DISPOSABLE) ×2 IMPLANT
GOWN STRL REUS W/TWL LRG LVL3 (GOWN DISPOSABLE) ×8
GOWN STRL REUS W/TWL XL LVL3 (GOWN DISPOSABLE) ×8 IMPLANT
KNEE WRAP E Z 3 GEL PACK (MISCELLANEOUS) ×3 IMPLANT
NDL HYPO 25X1 1.5 SAFETY (NEEDLE) ×1 IMPLANT
NEEDLE HYPO 25X1 1.5 SAFETY (NEEDLE) ×4 IMPLANT
NS IRRIG 1000ML POUR BTL (IV SOLUTION) ×4 IMPLANT
PACK BASIN DAY SURGERY FS (CUSTOM PROCEDURE TRAY) ×4 IMPLANT
PAD CAST 3X4 CTTN HI CHSV (CAST SUPPLIES) IMPLANT
PADDING CAST ABS 4INX4YD NS (CAST SUPPLIES) ×2
PADDING CAST ABS COTTON 4X4 ST (CAST SUPPLIES) ×2 IMPLANT
PADDING CAST COTTON 3X4 STRL (CAST SUPPLIES)
PADDING UNDERCAST 2 STRL (CAST SUPPLIES)
PADDING UNDERCAST 2X4 STRL (CAST SUPPLIES) IMPLANT
PENCIL BUTTON HOLSTER BLD 10FT (ELECTRODE) ×3 IMPLANT
STOCKINETTE 4X48 STRL (DRAPES) ×1 IMPLANT
STRIP CLOSURE SKIN 1/2X4 (GAUZE/BANDAGES/DRESSINGS) ×2 IMPLANT
SUT ETHILON 3 0 PS 1 (SUTURE) ×4 IMPLANT
SUT VIC AB 0 CT1 27 (SUTURE) ×4
SUT VIC AB 0 CT1 27XBRD ANBCTR (SUTURE) ×1 IMPLANT
SUT VIC AB 3-0 SH 27 (SUTURE)
SUT VIC AB 3-0 SH 27X BRD (SUTURE) IMPLANT
SYR BULB 3OZ (MISCELLANEOUS) ×4 IMPLANT
SYR CONTROL 10ML LL (SYRINGE) ×4 IMPLANT
TOWEL OR 17X24 6PK STRL BLUE (TOWEL DISPOSABLE) ×4 IMPLANT
UNDERPAD 30X30 INCONTINENT (UNDERPADS AND DIAPERS) ×4 IMPLANT

## 2014-06-25 NOTE — Anesthesia Postprocedure Evaluation (Signed)
Anesthesia Post Note  Patient: Tyler Clarke  Procedure(s) Performed: Procedure(s) (LRB): RIGHT KNEE PRE-PATELLA BURSECTOMY (Right)  Anesthesia type: general  Patient location: PACU  Post pain: Pain level controlled  Post assessment: Patient's Cardiovascular Status Stable  Last Vitals:  Filed Vitals:   06/25/14 1300  BP: 127/86  Pulse: 89  Temp:   Resp: 12    Post vital signs: Reviewed and stable  Level of consciousness: sedated  Complications: No apparent anesthesia complications

## 2014-06-25 NOTE — Anesthesia Preprocedure Evaluation (Addendum)
Anesthesia Evaluation  Patient identified by MRN, date of birth, ID band Patient awake    Reviewed: Allergy & Precautions, H&P , NPO status , Patient's Chart, lab work & pertinent test results  Airway Mallampati: II  TM Distance: >3 FB Neck ROM: Full    Dental  (+) Teeth Intact, Dental Advisory Given   Pulmonary Current Smoker,  breath sounds clear to auscultation        Cardiovascular negative cardio ROS  Rhythm:regular Rate:Normal     Neuro/Psych negative neurological ROS  negative psych ROS   GI/Hepatic negative GI ROS, Neg liver ROS,   Endo/Other  diabetes  Renal/GU negative Renal ROS     Musculoskeletal   Abdominal   Peds  Hematology   Anesthesia Other Findings   Reproductive/Obstetrics negative OB ROS                            Anesthesia Physical Anesthesia Plan  ASA: II  Anesthesia Plan: General LMA   Post-op Pain Management:    Induction:   Airway Management Planned: Simple Face Mask  Additional Equipment:   Intra-op Plan:   Post-operative Plan:   Informed Consent: I have reviewed the patients History and Physical, chart, labs and discussed the procedure including the risks, benefits and alternatives for the proposed anesthesia with the patient or authorized representative who has indicated his/her understanding and acceptance.   Dental Advisory Given  Plan Discussed with: Anesthesiologist, CRNA and Surgeon  Anesthesia Plan Comments:        Anesthesia Quick Evaluation

## 2014-06-25 NOTE — Discharge Instructions (Signed)
Weight bearing as tolerated.  Do not remove bandages.  May shower in 3 days, but do not soak incision.  May apply ice for up to 20 minutes at a time for pain and swelling.  Follow up appointment in one week.  SEEK IMMEDIATE MEDICAL CARE IF:   You develop increased redness, swelling, or pain around your incision sites.  You have increased pain with movement of your knee.  You have a marked increase in swelling around your knee.  You have a lot of pain in your leg when you move your foot up and down at your ankle.  There is pus or any unusual drainage coming from your incision sites.  You develop a fever.  You notice a bad smell coming from your incision sites.  Any of your incisions break open (edges do not stay together) after sutures or staples have been removed. Document Released: 09/09/2004 Document Revised: 07/07/2013 Document Reviewed: 07/16/2011 [facility] Patient Information 2015 [employer], [place]. This information is not intended to replace advice given to you by your health care provider. Make sure you discuss any questions you have with your health care provider.    Post Anesthesia Home Care Instructions  Activity: Get plenty of rest for the remainder of the day. A responsible adult should stay with you for 24 hours following the procedure.  For the next 24 hours, DO NOT: -Drive a car -Advertising copywriterperate machinery -Drink alcoholic beverages -Take any medication unless instructed by your physician -Make any legal decisions or sign important papers.  Meals: Start with liquid foods such as gelatin or soup. Progress to regular foods as tolerated. Avoid greasy, spicy, heavy foods. If nausea and/or vomiting occur, drink only clear liquids until the nausea and/or vomiting subsides. Call your physician if vomiting continues.  Special Instructions/Symptoms: Your throat may feel dry or sore from the anesthesia or the breathing tube placed in your throat during surgery. If this causes  discomfort, gargle with warm salt water. The discomfort should disappear within 24 hours.  If you had a scopolamine patch placed behind your ear for the management of post- operative nausea and/or vomiting:  1. The medication in the patch is effective for 72 hours, after which it should be removed.  Wrap patch in a tissue and discard in the trash. Wash hands thoroughly with soap and water. 2. You may remove the patch earlier than 72 hours if you experience unpleasant side effects which may include dry mouth, dizziness or visual disturbances. 3. Avoid touching the patch. Wash your hands with soap and water after contact with the patch.   Call your surgeon if you experience:   1.  Fever over 101.0. 2.  Inability to urinate. 3.  Nausea and/or vomiting. 4.  Extreme swelling or bruising at the surgical site. 5.  Continued bleeding from the incision. 6.  Increased pain, redness or drainage from the incision. 7.  Problems related to your pain medication. 8. Any change in color, movement and/or sensation 9. Any problems and/or concerns

## 2014-06-25 NOTE — Transfer of Care (Signed)
Immediate Anesthesia Transfer of Care Note  Patient: Tyler Clarke  Procedure(s) Performed: Procedure(s): RIGHT KNEE PRE-PATELLA BURSECTOMY (Right)  Patient Location: PACU  Anesthesia Type:General  Level of Consciousness: awake, alert , oriented and patient cooperative  Airway & Oxygen Therapy: Patient Spontanous Breathing and Patient connected to face mask oxygen  Post-op Assessment: Report given to RN, Post -op Vital signs reviewed and stable and Patient moving all extremities  Post vital signs: Reviewed and stable  Last Vitals:  Filed Vitals:   06/25/14 0953  BP: 134/82  Pulse: 104  Temp: 36.9 C  Resp: 20    Complications: No apparent anesthesia complications

## 2014-06-25 NOTE — Interval H&P Note (Signed)
History and Physical Interval Note:  06/25/2014 7:29 AM  Tyler Clarke  has presented today for surgery, with the diagnosis of Prepatellar bursitis, right knee  M70.41  The various methods of treatment have been discussed with the patient and family. After consideration of risks, benefits and other options for treatment, the patient has consented to  Procedure(s): RIGHT KNEE PRE-PATELLA BURSECTOMY (Right) as a surgical intervention .  The patient's history has been reviewed, patient examined, no change in status, stable for surgery.  I have reviewed the patient's chart and labs.  Questions were answered to the patient's satisfaction.     Kaelen Brennan F

## 2014-06-26 ENCOUNTER — Encounter (HOSPITAL_BASED_OUTPATIENT_CLINIC_OR_DEPARTMENT_OTHER): Payer: Self-pay | Admitting: Orthopedic Surgery

## 2014-06-26 NOTE — Op Note (Signed)
NAME:  Tyler Clarke, Tyler Clarke                 ACCOUNT NO.:  [id number]  MEDICAL RECORD NO.:  [id number]  LOCATION:                                FACILITY:  [facility]  PHYSICIAN:  Loreta Aveaniel F. Murphy, M.D. DATE OF BIRTH:  03-16-1975  DATE OF PROCEDURE:  06/25/2014 DATE OF DISCHARGE:  06/25/2014                              OPERATIVE REPORT   PREOPERATIVE DIAGNOSIS:  Posttraumatic prepatellar bursitis, right knee.  POSTOPERATIVE DIAGNOSIS:  Posttraumatic prepatellar bursitis, right knee.  PROCEDURE:  Excision of prepatellar bursa, right knee.  SURGEON:  Loreta Aveaniel F. Murphy, M.D.  ASSISTANT:  Mikey KirschnerLindsey Stanberry, PA.  ANESTHESIA:  General.  BLOOD LOSS:  Minimal.  SPECIMENS:  None.  CULTURES:  None.  COMPLICATIONS:  None.  DRESSINGS:  Soft compressive.  TOURNIQUET TIME:  45 minutes.  DESCRIPTION OF PROCEDURE:  The patient was brought to the operating room and placed on the operating table in supine position.  After adequate anesthesia had been obtained, tourniquet applied, prepped and draped in usual sterile fashion.  Exsanguinated with elevation of Esmarch. Tourniquet was inflated to 350 mmHg.  A longitudinal incision above the patella down to the tibial tubercle just short of that.  Skin and subcutaneous tissue divided.  The markedly thickened prepatellar bursa was excised in its entirety all way down to the fascia below.  All surrounding structures were normal.  Wound was irrigated.  Closed in a layered manner, subcutaneous and subcuticular.  Margins were injected with Marcaine.  Sterile compressive dressing applied.  Tourniquet was deflated and removed.  Anesthesia was reversed.  Brought to the recovery room.  Tolerated the surgery well. No complications.     Loreta Aveaniel F. Murphy, M.D.     DFM/MEDQ  D:  06/25/2014  T:  06/26/2014  Job:  [id number]

## 2014-07-14 ENCOUNTER — Other Ambulatory Visit: Payer: Self-pay | Admitting: *Deleted

## 2014-07-14 ENCOUNTER — Other Ambulatory Visit: Payer: 59

## 2014-07-14 ENCOUNTER — Ambulatory Visit (HOSPITAL_COMMUNITY): Admission: RE | Admit: 2014-07-14 | Payer: 59 | Source: Ambulatory Visit

## 2014-07-15 ENCOUNTER — Telehealth: Payer: Self-pay | Admitting: Oncology

## 2014-07-15 NOTE — Telephone Encounter (Signed)
Left message to confirm lab added for 05/17

## 2014-07-21 ENCOUNTER — Ambulatory Visit: Payer: 59 | Admitting: Oncology

## 2014-07-21 ENCOUNTER — Other Ambulatory Visit: Payer: 59

## 2015-12-21 ENCOUNTER — Other Ambulatory Visit: Payer: Self-pay | Admitting: Family Medicine

## 2015-12-21 DIAGNOSIS — N5089 Other specified disorders of the male genital organs: Secondary | ICD-10-CM

## 2015-12-29 ENCOUNTER — Ambulatory Visit
Admission: RE | Admit: 2015-12-29 | Discharge: 2015-12-29 | Disposition: A | Discharge status: Home / Self Care | Payer: 59 | Source: Ambulatory Visit | Attending: Family Medicine | Admitting: Family Medicine

## 2015-12-29 DIAGNOSIS — N5089 Other specified disorders of the male genital organs: Secondary | ICD-10-CM

## 2016-07-26 ENCOUNTER — Encounter: Payer: Self-pay | Admitting: *Deleted

## 2016-07-27 ENCOUNTER — Emergency Department (HOSPITAL_COMMUNITY)
Admission: EM | Admit: 2016-07-27 | Discharge: 2016-07-27 | Disposition: A | Discharge status: Home / Self Care | Payer: Medicaid Other | Attending: Emergency Medicine | Admitting: Emergency Medicine

## 2016-07-27 ENCOUNTER — Encounter (HOSPITAL_COMMUNITY): Payer: Self-pay | Admitting: Emergency Medicine

## 2016-07-27 ENCOUNTER — Emergency Department (HOSPITAL_COMMUNITY): Payer: Medicaid Other

## 2016-07-27 DIAGNOSIS — Z79891 Long term (current) use of opiate analgesic: Secondary | ICD-10-CM | POA: Insufficient documentation

## 2016-07-27 DIAGNOSIS — F17210 Nicotine dependence, cigarettes, uncomplicated: Secondary | ICD-10-CM | POA: Diagnosis not present

## 2016-07-27 DIAGNOSIS — R945 Abnormal results of liver function studies: Secondary | ICD-10-CM | POA: Diagnosis not present

## 2016-07-27 DIAGNOSIS — E876 Hypokalemia: Secondary | ICD-10-CM | POA: Diagnosis not present

## 2016-07-27 DIAGNOSIS — E1165 Type 2 diabetes mellitus with hyperglycemia: Secondary | ICD-10-CM | POA: Insufficient documentation

## 2016-07-27 DIAGNOSIS — R739 Hyperglycemia, unspecified: Secondary | ICD-10-CM

## 2016-07-27 DIAGNOSIS — R7989 Other specified abnormal findings of blood chemistry: Secondary | ICD-10-CM

## 2016-07-27 LAB — HEPATIC FUNCTION PANEL
ALBUMIN: 3.5 g/dL (ref 3.5–5.0)
ALT: 113 U/L — ABNORMAL HIGH (ref 17–63)
AST: 237 U/L — ABNORMAL HIGH (ref 15–41)
Alkaline Phosphatase: 198 U/L — ABNORMAL HIGH (ref 38–126)
Bilirubin, Direct: 4.5 mg/dL — ABNORMAL HIGH (ref 0.1–0.5)
Indirect Bilirubin: 2.7 mg/dL — ABNORMAL HIGH (ref 0.3–0.9)
Total Bilirubin: 7.2 mg/dL — ABNORMAL HIGH (ref 0.3–1.2)
Total Protein: 6.9 g/dL (ref 6.5–8.1)

## 2016-07-27 LAB — CBG MONITORING, ED: Glucose-Capillary: 367 mg/dL — ABNORMAL HIGH (ref 65–99)

## 2016-07-27 LAB — URINALYSIS, ROUTINE W REFLEX MICROSCOPIC
BACTERIA UA: NONE SEEN
BILIRUBIN URINE: NEGATIVE
Glucose, UA: >=500 mg/dL — AB
Hgb urine dipstick: NEGATIVE
Ketones, ur: NEGATIVE mg/dL
Leukocytes, UA: NEGATIVE
Nitrite: NEGATIVE
Protein, ur: NEGATIVE mg/dL
Specific Gravity, Urine: 1.028 (ref 1.005–1.030)
pH: 6 (ref 5.0–8.0)

## 2016-07-27 LAB — CBC
HCT: 42.1 % (ref 39.0–52.0)
HEMOGLOBIN: 15.5 g/dL (ref 13.0–17.0)
MCH: 38.3 pg — ABNORMAL HIGH (ref 26.0–34.0)
MCHC: 36.8 g/dL — ABNORMAL HIGH (ref 30.0–36.0)
MCV: 104 fL — ABNORMAL HIGH (ref 78.0–100.0)
PLATELETS: 159 10*3/uL (ref 150–400)
RBC: 4.05 MIL/uL — AB (ref 4.22–5.81)
RDW: 12.4 % (ref 11.5–15.5)
WBC: 8.7 10*3/uL (ref 4.0–10.5)

## 2016-07-27 LAB — BASIC METABOLIC PANEL
Anion gap: 17 — ABNORMAL HIGH (ref 5–15)
BUN: <5 mg/dL — ABNORMAL LOW (ref 6–20)
CALCIUM: 8.7 mg/dL — AB (ref 8.9–10.3)
CO2: 24 mmol/L (ref 22–32)
CREATININE: 0.4 mg/dL — AB (ref 0.61–1.24)
Chloride: 94 mmol/L — ABNORMAL LOW (ref 101–111)
GFR calc Af Amer: >60 mL/min (ref >60)
GFR calc non Af Amer: >60 mL/min (ref >60)
Glucose, Bld: 408 mg/dL — ABNORMAL HIGH (ref 65–99)
Potassium: 2.6 mmol/L — CL (ref 3.5–5.1)
SODIUM: 135 mmol/L (ref 135–145)

## 2016-07-27 LAB — MAGNESIUM: Magnesium: 1.5 mg/dL — ABNORMAL LOW (ref 1.7–2.4)

## 2016-07-27 MED ORDER — MAGNESIUM OXIDE 400 MG PO TABS: 400.0000 mg | ORAL_TABLET | Freq: Every day | ORAL | 0 refills | Status: DC

## 2016-07-27 MED ORDER — MAGNESIUM SULFATE 50 % IJ SOLN: 2.0000 g | Freq: Once | INTRAMUSCULAR | Status: DC

## 2016-07-27 MED ORDER — SODIUM CHLORIDE 0.9 % IV BOLUS (SEPSIS)
1000.0000 mL | Freq: Once | INTRAVENOUS | Status: AC
Start: 2016-07-27 — End: 2016-07-27
  Administered 2016-07-27: 1000 mL via INTRAVENOUS

## 2016-07-27 MED ORDER — POTASSIUM CHLORIDE 10 MEQ/100ML IV SOLN
10.0000 meq | INTRAVENOUS | Status: AC
Start: 2016-07-27 — End: 2016-07-27
  Administered 2016-07-27 (×3): 10 meq via INTRAVENOUS
  Filled 2016-07-27 (×3): qty 100

## 2016-07-27 MED ORDER — SODIUM CHLORIDE 0.9 % IV BOLUS (SEPSIS)
1000.0000 mL | Freq: Once | INTRAVENOUS | Status: AC
  Administered 2016-07-27: 1000 mL via INTRAVENOUS

## 2016-07-27 MED ORDER — POTASSIUM CHLORIDE CRYS ER 20 MEQ PO TBCR
40.0000 meq | EXTENDED_RELEASE_TABLET | Freq: Once | ORAL | Status: AC
  Administered 2016-07-27: 40 meq via ORAL
  Filled 2016-07-27: qty 2

## 2016-07-27 MED ORDER — MAGNESIUM SULFATE 2 GM/50ML IV SOLN
2.0000 g | Freq: Once | INTRAVENOUS | Status: AC
Start: 2016-07-27 — End: 2016-07-27
  Administered 2016-07-27: 2 g via INTRAVENOUS
  Filled 2016-07-27: qty 50

## 2016-07-27 MED ORDER — POTASSIUM CHLORIDE CRYS ER 20 MEQ PO TBCR: 20.0000 meq | EXTENDED_RELEASE_TABLET | Freq: Two times a day (BID) | ORAL | 0 refills | Status: DC

## 2016-07-27 NOTE — ED Notes (Signed)
Pt aware a urine sample is needed but is unable to provide one at this time. 

## 2016-07-27 NOTE — ED Triage Notes (Signed)
Pt sent by PMD for abnormal labs: elevated liver function panel, hyperglycemia, hypokalemia. Pt states he has had severe pain in bilateral feet and lower back. Pt started hydrocodone yesterday for pain and patient states relief with pain meds.

## 2016-07-27 NOTE — Discharge Instructions (Signed)
Follow up with your Doctor tomorrow for your abnormal liver tests and abnormal electrolytes (magnesium, potassium)

## 2016-07-27 NOTE — ED Provider Notes (Signed)
[facility]-EMERGENCY DEPT Provider Note   CSN: [id number] Arrival date & time: 07/27/16  1219     History   Chief Complaint Chief Complaint  Patient presents with  . Abnormal Lab  . Hyperglycemia  . Elevated Hepatic Enzymes    HPI Tyler Clarke is a 41 y.o. male.  HPI  41 year old male presents after being called by his doctor to come in for abnormal electrolytes. He was following up with his doctor yesterday because he has been having one month of low midline back pain as well as 2 months of bilateral plantar foot pain. He states it feels like needles on the bottom of his feet. The back pain radiates down both legs on the outside. There is no weakness or numbness besides in the bottom of his feet. No injuries to either. He saw his doctor and was referred here because of low potassium and abnormal liver tests. His glucose was also elevated. He used to have a history of diabetes but states that he was able to get off of medicines by weight loss. However he has not had any new or unexplained weight loss. Patient denies any abdominal pain, vomiting, or diarrhea. He is not on any chronic medicines. He was placed on hydrocodone yesterday for this back pain, that is his only medicine. He endorses drinking alcohol but states he only drinks a couple times a week, however he changes the amount of alcohol in each sitting when I ask him in multiple different ways. Last drink last night.  His PCP, Dr. Hal Hopeichter, spoke to my colleague in the ED and asked for replacement of potassium and referral back to them for further outpatient management.   Past Medical History:  Diagnosis Date  . Diet-controlled diabetes mellitus (HCC)   . Polycythemia   . Prepatellar bursitis of right knee 06/2014    Patient Active Problem List   Diagnosis Date Noted  . Low back pain 07/21/2013  . DIABETES MELLITUS 03/31/2010  . RENAL CALCULUS, HX OF 03/31/2010    Past Surgical History:  Procedure Laterality Date  . KNEE  BURSECTOMY Right 06/25/2014   Procedure: RIGHT KNEE PRE-PATELLA BURSECTOMY;  Surgeon: Mckinley Jewelaniel Murphy, MD;  Location: [facility];  Service: Orthopedics;  Laterality: Right;  . TONSILLECTOMY AND ADENOIDECTOMY    . WISDOM TOOTH EXTRACTION         Home Medications    Prior to Admission medications   Medication Sig Start Date End Date Taking? Authorizing Provider  HYDROcodone-acetaminophen (NORCO/VICODIN) 5-325 MG tablet Take 1 tablet by mouth every 4 (four) hours as needed for moderate pain.   Yes [provider]  magnesium oxide (MAG-OX) 400 MG tablet Take 1 tablet (400 mg total) by mouth daily. 07/27/16   Pricilla LovelessGoldston, Anjali Manzella, MD  potassium chloride SA (K-DUR,KLOR-CON) 20 MEQ tablet Take 1 tablet (20 mEq total) by mouth 2 (two) times daily. 07/27/16   Pricilla LovelessGoldston, Travus Oren, MD    Family History Family History  Problem Relation Age of Onset  . Adopted: Yes    Social History Social History  Substance Use Topics  . Smoking status: Current Every Day Smoker    Packs/day: 0.50    Years: 10.00    Types: Cigarettes  . Smokeless tobacco: Never Used  . Alcohol use Yes     Comment: "few drinks/day"     Allergies   Patient has no known allergies.   Review of Systems Review of Systems  Constitutional: Negative for fever.  Respiratory: Negative for shortness of  breath.   Cardiovascular: Negative for chest pain.  Gastrointestinal: Negative for abdominal pain, diarrhea and vomiting.  Musculoskeletal: Positive for back pain.  Neurological: Positive for numbness. Negative for weakness and headaches.  All other systems reviewed and are negative.    Physical Exam Updated Vital Signs BP (!) 129/95 (BP Location: Left Arm)   Pulse (!) 114   Temp 97.8 F (36.6 C) (Oral)   Resp 14   Ht 6\' 2"  (1.88 m)   Wt 83.9 kg (185 lb)   SpO2 97%   BMI 23.75 kg/m   Physical Exam  Constitutional: He is oriented to person, place, and time. He appears well-developed and  well-nourished.  HENT:  Head: Normocephalic and atraumatic.  Right Ear: External ear normal.  Left Ear: External ear normal.  Nose: Nose normal.  Eyes: EOM are normal. Pupils are equal, round, and reactive to light. Right eye exhibits no discharge. Left eye exhibits no discharge.  Neck: Neck supple.  Cardiovascular: Regular rhythm and normal heart sounds.  Tachycardia present.   Pulmonary/Chest: Effort normal and breath sounds normal.  Abdominal: Soft. There is no tenderness.  Musculoskeletal: He exhibits no edema.  Neurological: He is alert and oriented to person, place, and time.  CN 3-12 grossly intact. 5/5 strength in all 4 extremities. Grossly normal sensation. Normal finger to nose.  Fine tremor in upper extremities noted  Skin: Skin is warm and dry.  Nursing note and vitals reviewed.    ED Treatments / Results  Labs (all labs ordered are listed, but only abnormal results are displayed) Labs Reviewed  BASIC METABOLIC PANEL - Abnormal; Notable for the following:       Result Value   Potassium 2.6 (*)    Chloride 94 (*)    Glucose, Bld 408 (*)    BUN <5 (*)    Creatinine, Ser 0.40 (*)    Calcium 8.7 (*)    Anion gap 17 (*)    All other components within normal limits  CBC - Abnormal; Notable for the following:    RBC 4.05 (*)    MCV 104.0 (*)    MCH 38.3 (*)    MCHC 36.8 (*)    All other components within normal limits  URINALYSIS, ROUTINE W REFLEX MICROSCOPIC - Abnormal; Notable for the following:    Color, Urine AMBER (*)    Glucose, UA >=500 (*)    Squamous Epithelial / LPF 0-5 (*)    All other components within normal limits  HEPATIC FUNCTION PANEL - Abnormal; Notable for the following:    AST 237 (*)    ALT 113 (*)    Alkaline Phosphatase 198 (*)    Total Bilirubin 7.2 (*)    Bilirubin, Direct 4.5 (*)    Indirect Bilirubin 2.7 (*)    All other components within normal limits  MAGNESIUM - Abnormal; Notable for the following:    Magnesium 1.5 (*)    All  other components within normal limits  CBG MONITORING, ED - Abnormal; Notable for the following:    Glucose-Capillary 367 (*)    All other components within normal limits  CBG MONITORING, ED    EKG  EKG Interpretation  Date/Time:  Thursday Jul 27 2016 13:45:43 EDT Ventricular Rate:  113 PR Interval:    QRS Duration: 91 QT Interval:  362 QTC Calculation: 497 R Axis:   17 Text Interpretation:  Sinus tachycardia Abnormal R-wave progression, early transition Borderline ST depression, anterolateral leads Borderline prolonged QT interval no significant  change since 2015 Confirmed by Pricilla Loveless [phone]) on 07/27/2016 1:54:42 PM       Radiology Dg Lumbar Spine Complete  Result Date: 07/27/2016 CLINICAL DATA:  Low back pain for 1 month EXAM: LUMBAR SPINE - COMPLETE 4+ VIEW COMPARISON:  None. FINDINGS: Lumbar alignment within normal limits. Mild degenerative changes at L1-L2, L2-L3 and L4-L5. No fracture or malalignment IMPRESSION: Mild degenerative changes.  No acute osseous abnormality. Electronically Signed   By: Jasmine Pang M.D.   On: 07/27/2016 14:07   US Abdomen Limited Ruq  Result Date: 07/27/2016 CLINICAL DATA:  Abnormal liver function tests. EXAM: US ABDOMEN LIMITED - RIGHT UPPER QUADRANT COMPARISON:  None. FINDINGS: Gallbladder: No gallstones or wall thickening visualized. No sonographic Murphy sign noted by sonographer. Small amount of sludge is seen within the gallbladder. Common bile duct: Diameter: 4.5 mm which is within normal limits. Liver: Increased echogenicity of hepatic parenchyma is noted suggesting fatty infiltration. 1.6 cm left hepatic cyst is noted. IMPRESSION: Gallbladder sludge is noted. Increased echogenicity of hepatic parenchyma suggesting fatty infiltration or other diffuse hepatocellular disease. Small left hepatic cyst is noted. Electronically Signed   By: Lupita Raider, M.D.   On: 07/27/2016 15:14    Procedures Procedures (including critical care  time)  Medications Ordered in ED Medications  potassium chloride 10 mEq in 100 mL IVPB (10 mEq Intravenous New Bag/Given 07/27/16 1642)  sodium chloride 0.9 % bolus 1,000 mL (1,000 mLs Intravenous New Bag/Given 07/27/16 1415)  potassium chloride SA (K-DUR,KLOR-CON) CR tablet 40 mEq (40 mEq Oral Given 07/27/16 1415)  magnesium sulfate IVPB 2 g 50 mL (0 g Intravenous Stopped 07/27/16 1620)     Initial Impression / Assessment and Plan / ED Course  I have reviewed the triage vital signs and the nursing notes.  Pertinent labs & imaging results that were available during my care of the patient were reviewed by me and considered in my medical decision making (see chart for details).     Patient's workup shows low potassium, low magnesium (mild) and hyperglycemia. AG is mildly elevated, but this is probably from low chloride and his Bicarb is normal (24) so this does not appear to represent DKA (no ketones in urine either). Given fluids, started on oral potassium, IV potassium, and IV magnesium. D/c with magnesium and potassium. ETOH abuse is possible cause of electrolyte disturbances from poor nutrition. Tachycardia has improved. Bili is elevated, RUQ u/s without acute emergent pathology. F/u with PCP closely for further workup. Encouraged to cut back on ETOH given these findings. Care to Dr. Clarene Duke with K replacement pending.  Final Clinical Impressions(s) / ED Diagnoses   Final diagnoses:  Abnormal LFTs  Hypokalemia  Hyperglycemia  Hypomagnesemia    New Prescriptions New Prescriptions   MAGNESIUM OXIDE (MAG-OX) 400 MG TABLET    Take 1 tablet (400 mg total) by mouth daily.   POTASSIUM CHLORIDE SA (K-DUR,KLOR-CON) 20 MEQ TABLET    Take 1 tablet (20 mEq total) by mouth 2 (two) times daily.     Pricilla Loveless, MD 07/27/16 [phone]

## 2016-08-03 ENCOUNTER — Encounter: Payer: Self-pay | Admitting: Physician Assistant

## 2016-08-09 ENCOUNTER — Ambulatory Visit: Payer: 59 | Admitting: Physician Assistant

## 2016-08-22 ENCOUNTER — Emergency Department (HOSPITAL_COMMUNITY): Payer: Medicaid Other

## 2016-08-22 ENCOUNTER — Encounter (HOSPITAL_COMMUNITY): Payer: Self-pay

## 2016-08-22 ENCOUNTER — Inpatient Hospital Stay (HOSPITAL_COMMUNITY)
Admission: EM | Admit: 2016-08-22 | Discharge: 2016-09-06 | DRG: 432 | Disposition: A | Discharge status: Hospice | Payer: Medicaid Other | Attending: Internal Medicine | Admitting: Internal Medicine

## 2016-08-22 DIAGNOSIS — I255 Ischemic cardiomyopathy: Secondary | ICD-10-CM | POA: Diagnosis present

## 2016-08-22 DIAGNOSIS — E871 Hypo-osmolality and hyponatremia: Secondary | ICD-10-CM | POA: Diagnosis present

## 2016-08-22 DIAGNOSIS — K767 Hepatorenal syndrome: Secondary | ICD-10-CM | POA: Diagnosis present

## 2016-08-22 DIAGNOSIS — F10121 Alcohol abuse with intoxication delirium: Secondary | ICD-10-CM | POA: Diagnosis not present

## 2016-08-22 DIAGNOSIS — K652 Spontaneous bacterial peritonitis: Secondary | ICD-10-CM | POA: Diagnosis present

## 2016-08-22 DIAGNOSIS — L8990 Pressure ulcer of unspecified site, unspecified stage: Secondary | ICD-10-CM | POA: Diagnosis present

## 2016-08-22 DIAGNOSIS — I4581 Long QT syndrome: Secondary | ICD-10-CM | POA: Diagnosis present

## 2016-08-22 DIAGNOSIS — Z6824 Body mass index (BMI) 24.0-24.9, adult: Secondary | ICD-10-CM

## 2016-08-22 DIAGNOSIS — K7011 Alcoholic hepatitis with ascites: Secondary | ICD-10-CM

## 2016-08-22 DIAGNOSIS — E1165 Type 2 diabetes mellitus with hyperglycemia: Secondary | ICD-10-CM | POA: Diagnosis present

## 2016-08-22 DIAGNOSIS — G9340 Encephalopathy, unspecified: Secondary | ICD-10-CM | POA: Diagnosis not present

## 2016-08-22 DIAGNOSIS — N179 Acute kidney failure, unspecified: Secondary | ICD-10-CM | POA: Diagnosis not present

## 2016-08-22 DIAGNOSIS — E43 Unspecified severe protein-calorie malnutrition: Secondary | ICD-10-CM | POA: Diagnosis not present

## 2016-08-22 DIAGNOSIS — Z6823 Body mass index (BMI) 23.0-23.9, adult: Secondary | ICD-10-CM

## 2016-08-22 DIAGNOSIS — R4781 Slurred speech: Secondary | ICD-10-CM | POA: Diagnosis present

## 2016-08-22 DIAGNOSIS — D684 Acquired coagulation factor deficiency: Secondary | ICD-10-CM | POA: Diagnosis present

## 2016-08-22 DIAGNOSIS — D72828 Other elevated white blood cell count: Secondary | ICD-10-CM | POA: Diagnosis not present

## 2016-08-22 DIAGNOSIS — F1010 Alcohol abuse, uncomplicated: Secondary | ICD-10-CM | POA: Diagnosis not present

## 2016-08-22 DIAGNOSIS — G2581 Restless legs syndrome: Secondary | ICD-10-CM | POA: Diagnosis present

## 2016-08-22 DIAGNOSIS — K7031 Alcoholic cirrhosis of liver with ascites: Secondary | ICD-10-CM | POA: Diagnosis not present

## 2016-08-22 DIAGNOSIS — Z7984 Long term (current) use of oral hypoglycemic drugs: Secondary | ICD-10-CM

## 2016-08-22 DIAGNOSIS — R296 Repeated falls: Secondary | ICD-10-CM | POA: Diagnosis present

## 2016-08-22 DIAGNOSIS — K7460 Unspecified cirrhosis of liver: Secondary | ICD-10-CM

## 2016-08-22 DIAGNOSIS — Z66 Do not resuscitate: Secondary | ICD-10-CM | POA: Diagnosis present

## 2016-08-22 DIAGNOSIS — E870 Hyperosmolality and hypernatremia: Secondary | ICD-10-CM | POA: Diagnosis not present

## 2016-08-22 DIAGNOSIS — R0682 Tachypnea, not elsewhere classified: Secondary | ICD-10-CM | POA: Diagnosis not present

## 2016-08-22 DIAGNOSIS — D539 Nutritional anemia, unspecified: Secondary | ICD-10-CM | POA: Diagnosis present

## 2016-08-22 DIAGNOSIS — E118 Type 2 diabetes mellitus with unspecified complications: Secondary | ICD-10-CM | POA: Diagnosis not present

## 2016-08-22 DIAGNOSIS — K7040 Alcoholic hepatic failure without coma: Principal | ICD-10-CM | POA: Diagnosis present

## 2016-08-22 DIAGNOSIS — R339 Retention of urine, unspecified: Secondary | ICD-10-CM | POA: Diagnosis present

## 2016-08-22 DIAGNOSIS — K7201 Acute and subacute hepatic failure with coma: Secondary | ICD-10-CM

## 2016-08-22 DIAGNOSIS — Z7189 Other specified counseling: Secondary | ICD-10-CM | POA: Diagnosis not present

## 2016-08-22 DIAGNOSIS — K7290 Hepatic failure, unspecified without coma: Secondary | ICD-10-CM | POA: Diagnosis present

## 2016-08-22 DIAGNOSIS — R9431 Abnormal electrocardiogram [ECG] [EKG]: Secondary | ICD-10-CM | POA: Diagnosis not present

## 2016-08-22 DIAGNOSIS — E876 Hypokalemia: Secondary | ICD-10-CM | POA: Diagnosis present

## 2016-08-22 DIAGNOSIS — Z720 Tobacco use: Secondary | ICD-10-CM | POA: Diagnosis not present

## 2016-08-22 DIAGNOSIS — R17 Unspecified jaundice: Secondary | ICD-10-CM | POA: Diagnosis not present

## 2016-08-22 DIAGNOSIS — L8940 Pressure ulcer of contiguous site of back, buttock and hip, unspecified stage: Secondary | ICD-10-CM | POA: Diagnosis not present

## 2016-08-22 DIAGNOSIS — R4182 Altered mental status, unspecified: Secondary | ICD-10-CM | POA: Diagnosis not present

## 2016-08-22 DIAGNOSIS — I959 Hypotension, unspecified: Secondary | ICD-10-CM | POA: Diagnosis present

## 2016-08-22 DIAGNOSIS — K759 Inflammatory liver disease, unspecified: Secondary | ICD-10-CM | POA: Diagnosis not present

## 2016-08-22 DIAGNOSIS — D751 Secondary polycythemia: Secondary | ICD-10-CM | POA: Diagnosis present

## 2016-08-22 DIAGNOSIS — Z515 Encounter for palliative care: Secondary | ICD-10-CM

## 2016-08-22 DIAGNOSIS — R0600 Dyspnea, unspecified: Secondary | ICD-10-CM | POA: Diagnosis not present

## 2016-08-22 DIAGNOSIS — F10239 Alcohol dependence with withdrawal, unspecified: Secondary | ICD-10-CM | POA: Diagnosis present

## 2016-08-22 DIAGNOSIS — E872 Acidosis: Secondary | ICD-10-CM | POA: Diagnosis present

## 2016-08-22 DIAGNOSIS — K7682 Hepatic encephalopathy: Secondary | ICD-10-CM

## 2016-08-22 DIAGNOSIS — F17210 Nicotine dependence, cigarettes, uncomplicated: Secondary | ICD-10-CM | POA: Diagnosis present

## 2016-08-22 DIAGNOSIS — Z79899 Other long term (current) drug therapy: Secondary | ICD-10-CM

## 2016-08-22 DIAGNOSIS — K7030 Alcoholic cirrhosis of liver without ascites: Secondary | ICD-10-CM

## 2016-08-22 DIAGNOSIS — A419 Sepsis, unspecified organism: Secondary | ICD-10-CM | POA: Diagnosis present

## 2016-08-22 DIAGNOSIS — N170 Acute kidney failure with tubular necrosis: Secondary | ICD-10-CM | POA: Diagnosis present

## 2016-08-22 DIAGNOSIS — N390 Urinary tract infection, site not specified: Secondary | ICD-10-CM | POA: Diagnosis present

## 2016-08-22 DIAGNOSIS — R6521 Severe sepsis with septic shock: Secondary | ICD-10-CM | POA: Diagnosis present

## 2016-08-22 DIAGNOSIS — R188 Other ascites: Secondary | ICD-10-CM

## 2016-08-22 DIAGNOSIS — D696 Thrombocytopenia, unspecified: Secondary | ICD-10-CM | POA: Diagnosis not present

## 2016-08-22 DIAGNOSIS — K7010 Alcoholic hepatitis without ascites: Secondary | ICD-10-CM | POA: Diagnosis not present

## 2016-08-22 DIAGNOSIS — K713 Toxic liver disease with chronic persistent hepatitis: Secondary | ICD-10-CM | POA: Diagnosis present

## 2016-08-22 DIAGNOSIS — K7200 Acute and subacute hepatic failure without coma: Secondary | ICD-10-CM | POA: Diagnosis not present

## 2016-08-22 DIAGNOSIS — R791 Abnormal coagulation profile: Secondary | ICD-10-CM | POA: Diagnosis not present

## 2016-08-22 DIAGNOSIS — K7041 Alcoholic hepatic failure with coma: Secondary | ICD-10-CM | POA: Diagnosis not present

## 2016-08-22 DIAGNOSIS — E860 Dehydration: Secondary | ICD-10-CM | POA: Diagnosis not present

## 2016-08-22 HISTORY — DX: Alcohol dependence, uncomplicated: F10.20

## 2016-08-22 HISTORY — DX: Alcoholic hepatitis with ascites: K70.11

## 2016-08-22 LAB — BASIC METABOLIC PANEL
ANION GAP: 15 (ref 5–15)
ANION GAP: 17 — AB (ref 5–15)
Anion gap: 21 — ABNORMAL HIGH (ref 5–15)
BUN: 53 mg/dL — AB (ref 6–20)
BUN: 53 mg/dL — ABNORMAL HIGH (ref 6–20)
BUN: 54 mg/dL — ABNORMAL HIGH (ref 6–20)
CALCIUM: 7.9 mg/dL — AB (ref 8.9–10.3)
CALCIUM: 7.8 mg/dL — AB (ref 8.9–10.3)
CO2: 20 mmol/L — ABNORMAL LOW (ref 22–32)
CO2: 21 mmol/L — AB (ref 22–32)
CO2: 18 mmol/L — ABNORMAL LOW (ref 22–32)
Calcium: 8.7 mg/dL — ABNORMAL LOW (ref 8.9–10.3)
Chloride: 73 mmol/L — ABNORMAL LOW (ref 101–111)
Chloride: 79 mmol/L — ABNORMAL LOW (ref 101–111)
Chloride: 79 mmol/L — ABNORMAL LOW (ref 101–111)
Creatinine, Ser: 3.15 mg/dL — ABNORMAL HIGH (ref 0.61–1.24)
Creatinine, Ser: 3.13 mg/dL — ABNORMAL HIGH (ref 0.61–1.24)
Creatinine, Ser: 3.18 mg/dL — ABNORMAL HIGH (ref 0.61–1.24)
GFR calc Af Amer: 27 mL/min — ABNORMAL LOW (ref >60)
GFR calc Af Amer: 27 mL/min — ABNORMAL LOW (ref >60)
GFR calc Af Amer: 27 mL/min — ABNORMAL LOW (ref >60)
GFR calc non Af Amer: 23 mL/min — ABNORMAL LOW (ref >60)
GFR calc non Af Amer: 23 mL/min — ABNORMAL LOW (ref >60)
GFR, EST NON AFRICAN AMERICAN: 23 mL/min — AB (ref >60)
GLUCOSE: 269 mg/dL — AB (ref 65–99)
GLUCOSE: 252 mg/dL — AB (ref 65–99)
Glucose, Bld: 304 mg/dL — ABNORMAL HIGH (ref 65–99)
POTASSIUM: 2.1 mmol/L — AB (ref 3.5–5.1)
Potassium: 2.1 mmol/L — CL (ref 3.5–5.1)
SODIUM: 114 mmol/L — AB (ref 135–145)
Sodium: 115 mmol/L — CL (ref 135–145)
Sodium: 114 mmol/L — CL (ref 135–145)

## 2016-08-22 LAB — AMMONIA
AMMONIA: 100 umol/L — AB (ref 9–35)
Ammonia: 72 umol/L — ABNORMAL HIGH (ref 9–35)

## 2016-08-22 LAB — CORTISOL: CORTISOL PLASMA: 39.6 ug/dL

## 2016-08-22 LAB — I-STAT CHEM 8, ED
BUN: 47 mg/dL — ABNORMAL HIGH (ref 6–20)
CREATININE: 3.1 mg/dL — AB (ref 0.61–1.24)
Calcium, Ion: 0.96 mmol/L — ABNORMAL LOW (ref 1.15–1.40)
Chloride: 76 mmol/L — ABNORMAL LOW (ref 101–111)
Glucose, Bld: 271 mg/dL — ABNORMAL HIGH (ref 65–99)
HEMATOCRIT: 32 % — AB (ref 39.0–52.0)
HEMOGLOBIN: 10.9 g/dL — AB (ref 13.0–17.0)
Potassium: 2.2 mmol/L — CL (ref 3.5–5.1)
SODIUM: 115 mmol/L — AB (ref 135–145)
TCO2: 23 mmol/L (ref 0–100)

## 2016-08-22 LAB — HEPATIC FUNCTION PANEL
ALT: 50 U/L (ref 17–63)
AST: 123 U/L — AB (ref 15–41)
Albumin: 1.9 g/dL — ABNORMAL LOW (ref 3.5–5.0)
Alkaline Phosphatase: 209 U/L — ABNORMAL HIGH (ref 38–126)
BILIRUBIN DIRECT: 23.2 mg/dL — AB (ref 0.1–0.5)
BILIRUBIN INDIRECT: 13.4 mg/dL — AB (ref 0.3–0.9)
BILIRUBIN TOTAL: 36.6 mg/dL — AB (ref 0.3–1.2)
Total Protein: 5.3 g/dL — ABNORMAL LOW (ref 6.5–8.1)

## 2016-08-22 LAB — TYPE AND SCREEN
ABO/RH(D): A POS
ANTIBODY SCREEN: NEGATIVE

## 2016-08-22 LAB — LACTIC ACID, PLASMA
LACTIC ACID, VENOUS: 3.4 mmol/L — AB (ref 0.5–1.9)
Lactic Acid, Venous: 4.2 mmol/L (ref 0.5–1.9)

## 2016-08-22 LAB — CBC
HCT: 30.8 % — ABNORMAL LOW (ref 39.0–52.0)
Hemoglobin: 11.3 g/dL — ABNORMAL LOW (ref 13.0–17.0)
MCH: 37.7 pg — ABNORMAL HIGH (ref 26.0–34.0)
MCHC: 36.7 g/dL — AB (ref 30.0–36.0)
MCV: 102.7 fL — ABNORMAL HIGH (ref 78.0–100.0)
Platelets: 147 10*3/uL — ABNORMAL LOW (ref 150–400)
RBC: 3 MIL/uL — ABNORMAL LOW (ref 4.22–5.81)
RDW: 12.8 % (ref 11.5–15.5)
WBC: 39.2 10*3/uL — AB (ref 4.0–10.5)

## 2016-08-22 LAB — MAGNESIUM: Magnesium: 2.3 mg/dL (ref 1.7–2.4)

## 2016-08-22 LAB — OSMOLALITY: OSMOLALITY: 270 mosm/kg — AB (ref 275–295)

## 2016-08-22 LAB — ETHANOL: Alcohol, Ethyl (B): <5 mg/dL (ref <5)

## 2016-08-22 LAB — CBG MONITORING, ED: Glucose-Capillary: 292 mg/dL — ABNORMAL HIGH (ref 65–99)

## 2016-08-22 LAB — HEMOGLOBIN AND HEMATOCRIT, BLOOD
HEMATOCRIT: 29.8 % — AB (ref 39.0–52.0)
HEMOGLOBIN: 10.8 g/dL — AB (ref 13.0–17.0)

## 2016-08-22 LAB — PROCALCITONIN: Procalcitonin: 5.07 ng/mL

## 2016-08-22 LAB — PROTIME-INR
INR: 1.61
Prothrombin Time: 19.3 seconds — ABNORMAL HIGH (ref 11.4–15.2)

## 2016-08-22 LAB — GLUCOSE, CAPILLARY: GLUCOSE-CAPILLARY: 220 mg/dL — AB (ref 65–99)

## 2016-08-22 LAB — ABO/RH: ABO/RH(D): A POS

## 2016-08-22 MED ORDER — POTASSIUM CHLORIDE 2 MEQ/ML IV SOLN
INTRAVENOUS | Status: DC
  Administered 2016-08-22: 18:00:00 via INTRAVENOUS
  Filled 2016-08-22 (×2): qty 1000

## 2016-08-22 MED ORDER — LIDOCAINE HCL 2 % EX GEL
1.0000 "application " | Freq: Once | CUTANEOUS | Status: AC
  Administered 2016-08-23: 1 via URETHRAL
  Filled 2016-08-22 (×2): qty 5

## 2016-08-22 MED ORDER — POTASSIUM CHLORIDE 10 MEQ/100ML IV SOLN
10.0000 meq | INTRAVENOUS | Status: AC
  Administered 2016-08-22 (×2): 10 meq via INTRAVENOUS
  Filled 2016-08-22 (×2): qty 100

## 2016-08-22 MED ORDER — SODIUM CHLORIDE 0.9 % IV SOLN
8.0000 mg/h | INTRAVENOUS | Status: DC
  Administered 2016-08-22: 8 mg/h via INTRAVENOUS
  Filled 2016-08-22 (×2): qty 80

## 2016-08-22 MED ORDER — MAGNESIUM SULFATE 2 GM/50ML IV SOLN
2.0000 g | Freq: Once | INTRAVENOUS | Status: AC
Start: 2016-08-22 — End: 2016-08-22
  Administered 2016-08-22: 2 g via INTRAVENOUS
  Filled 2016-08-22: qty 50

## 2016-08-22 MED ORDER — INSULIN ASPART 100 UNIT/ML ~~LOC~~ SOLN
0.0000 [IU] | SUBCUTANEOUS | Status: DC
Start: 2016-08-22 — End: 2016-08-23
  Administered 2016-08-22: 5 [IU] via SUBCUTANEOUS
  Administered 2016-08-23: 2 [IU] via SUBCUTANEOUS
  Administered 2016-08-23 (×2): 3 [IU] via SUBCUTANEOUS
  Filled 2016-08-22: qty 1

## 2016-08-22 MED ORDER — SODIUM CHLORIDE 0.9 % IV SOLN
50.0000 ug/h | INTRAVENOUS | Status: DC
  Administered 2016-08-22: 50 ug/h via INTRAVENOUS
  Filled 2016-08-22 (×2): qty 1

## 2016-08-22 MED ORDER — THIAMINE HCL 100 MG/ML IJ SOLN
Freq: Once | INTRAVENOUS | Status: AC
  Administered 2016-08-22: 21:00:00 via INTRAVENOUS
  Filled 2016-08-22: qty 1000

## 2016-08-22 MED ORDER — LACTULOSE 10 GM/15ML PO SOLN
30.0000 g | Freq: Three times a day (TID) | ORAL | Status: DC
  Administered 2016-08-23 (×2): 30 g via ORAL
  Filled 2016-08-22 (×5): qty 45

## 2016-08-22 MED ORDER — ALBUMIN HUMAN 5 % IV SOLN
12.5000 g | Freq: Four times a day (QID) | INTRAVENOUS | Status: DC
  Administered 2016-08-22 – 2016-08-23 (×3): 12.5 g via INTRAVENOUS
  Filled 2016-08-22 (×5): qty 250

## 2016-08-22 MED ORDER — SODIUM CHLORIDE 0.9 % IV SOLN
80.0000 mg | Freq: Once | INTRAVENOUS | Status: AC
  Administered 2016-08-22: 19:00:00 80 mg via INTRAVENOUS
  Filled 2016-08-22: qty 80

## 2016-08-22 MED ORDER — DEXTROSE 5 % IV SOLN
1.0000 g | INTRAVENOUS | Status: DC
  Administered 2016-08-22: 1 g via INTRAVENOUS
  Filled 2016-08-22: qty 10

## 2016-08-22 MED ORDER — SODIUM CHLORIDE 0.9 % IV SOLN
INTRAVENOUS | Status: DC
Start: 2016-08-22 — End: 2016-08-24
  Administered 2016-08-22 – 2016-08-24 (×3): via INTRAVENOUS
  Administered 2016-08-24: 75 mL/h via INTRAVENOUS

## 2016-08-22 MED ORDER — OCTREOTIDE LOAD VIA INFUSION
50.0000 ug | Freq: Once | INTRAVENOUS | Status: AC
  Administered 2016-08-22: 50 ug via INTRAVENOUS
  Filled 2016-08-22: qty 25

## 2016-08-22 MED ORDER — METHYLPREDNISOLONE SODIUM SUCC 125 MG IJ SOLR
60.0000 mg | Freq: Two times a day (BID) | INTRAMUSCULAR | Status: DC
  Administered 2016-08-22 – 2016-08-25 (×6): 60 mg via INTRAVENOUS
  Filled 2016-08-22: qty 0.96
  Filled 2016-08-22: qty 2
  Filled 2016-08-22: qty 0.96
  Filled 2016-08-22 (×2): qty 2
  Filled 2016-08-22: qty 0.96
  Filled 2016-08-22: qty 2

## 2016-08-22 MED ORDER — PANTOPRAZOLE SODIUM 40 MG IV SOLR
40.0000 mg | Freq: Two times a day (BID) | INTRAVENOUS | Status: DC
  Administered 2016-08-23 – 2016-08-24 (×5): 40 mg via INTRAVENOUS
  Filled 2016-08-22 (×6): qty 40

## 2016-08-22 MED ORDER — LACTATED RINGERS IV BOLUS (SEPSIS)
1000.0000 mL | Freq: Once | INTRAVENOUS | Status: DC
  Administered 2016-08-22: 1000 mL via INTRAVENOUS

## 2016-08-22 NOTE — Consult Note (Addendum)
Consultation  Referring Provider:     Critical care service, Dr. Tyson AliasFeinstein Primary Care Physician:  Kaleen MaskElkins, Wilson Oliver, MD Primary Gastroenterologist:     None, new now Dr. Leone PayorGessner    Reason for Consultation:     Jaundice liver failure     Impression / Plan:   41 year old man with severe alcoholic hepatitis with small amount of ascites and a nodular liver suggesting possible cirrhosis on ultrasound.   He has a  heme positive stool with mild anemia.  Marked leukocytosis likely from alcoholic hepatitis  Hepatic encephalopathy  Marked hyponatremia and renal insufficiency suggestive of hepatorenal syndrome  Severe protein calorie malnutrition  Type 2 diabetes mellitus uncontrolled  His discriminant function is 52 which portends a very poor prognosis as does the hepatorenal syndrome  He will need aggressive supportive care with good nutrition, a diagnostic paracentesis if possible though the small volume ascites might not be possible.  Agree with Solu-Medrol but think 40 mg daily would suffice. Once he can take adequate by mouth would put on prednisolone 40 mg daily. That might be as early as tomorrow.  We will follow-up again tomorrow. At some point he could need an upper endoscopy but I'm not overtly concerned about bleeding. At least based upon what we see. I don't think there is any good reason to do an endoscopy at this current time but he needs aggressive supportive care protection from withdrawal problems as he is getting a little tremulous as well.   Cautious careful increase in sodium. Would have nephrology see the patient by tomorrow.  Start lactulose   Will need dietitian input .  Reasonable to cover with ceftriaxone at this point. I would not continue the octreotide. Twice a day IV PPI makes sense.    I appreciate the opportunity to care for this patient. Iva Booparl E. Gessner, MD, [facility] [phone] (pager) [phone] after 5 PM,  weekends and holidays  08/22/2016 11:30 PM   HPI:   Tyler Clarke is a 41 y.o. male with a long history of drinking alcohol, up to 24 cans of beer and a daily tells me he's been cutting back a little bit. He's gradually been deteriorating and getting jaundiced. He had slurred speech and was seen by primary care earlier in the week. His had a recent fall with trauma to the left lower extremity. He says he quit drinking in the past few days none in 48 hours. Ethanol level less than 5 he was progressively weaker and presented to the emergency department for profound medical derangements and jaundice were noted. He is admitted by critical care service and I was asked to consult.  He has been taking some Advil and some narcotic pain medication after he felt  Past Medical History:  Diagnosis Date  . Alcoholic hepatitis with ascites   . Alcoholism (HCC)   . Diet-controlled diabetes mellitus (HCC)   . Polycythemia   . Prepatellar bursitis of right knee 06/2014    Past Surgical History:  Procedure Laterality Date  . KNEE BURSECTOMY Right 06/25/2014   Procedure: RIGHT KNEE PRE-PATELLA BURSECTOMY;  Surgeon: Mckinley Jewelaniel Murphy, MD;  Location: [facility];  Service: Orthopedics;  Laterality: Right;  . TONSILLECTOMY AND ADENOIDECTOMY    . WISDOM TOOTH EXTRACTION      Family History  Problem Relation Age of Onset  . Adopted: Yes    Social History  Substance Use Topics  . Smoking status: Current Every Day Smoker  Packs/day: 0.50    Years: 10.00    Types: Cigarettes  . Smokeless tobacco: Never Used  . Alcohol use Yes     Comment: "few drinks/day"    Prior to Admission medications   Medication Sig Start Date End Date Taking? Authorizing Provider  amitriptyline (ELAVIL) 10 MG tablet Take 10 mg by mouth at bedtime. 08/18/16  Yes [provider]  fexofenadine (ALLEGRA) 30 MG tablet Take 30 mg by mouth as needed (allergies).   Yes [provider]  ibuprofen  (ADVIL,MOTRIN) 200 MG tablet Take 400 mg by mouth every 6 (six) hours as needed for headache or mild pain.   Yes [provider]  metFORMIN (GLUCOPHAGE) 1000 MG tablet Take 500 mg by mouth daily. Takes half tablet 500 mg 08/18/16  Yes [provider]  potassium chloride SA (K-DUR,KLOR-CON) 20 MEQ tablet Take 1 tablet (20 mEq total) by mouth 2 (two) times daily. 07/27/16  Yes Pricilla Loveless, MD  magnesium oxide (MAG-OX) 400 MG tablet Take 1 tablet (400 mg total) by mouth daily. Patient not taking: Reported on 08/22/2016 07/27/16   Pricilla Loveless, MD    Current Facility-Administered Medications  Medication Dose Route Frequency Provider Last Rate Last Dose  . 0.9 %  sodium chloride infusion   Intravenous Continuous Duayne Cal, NP 75 mL/hr at 08/22/16 1928    . albumin human 5 % solution 12.5 g  12.5 g Intravenous Q6H Duayne Cal, NP 120 mL/hr at 08/22/16 2140 12.5 g at 08/22/16 2140  . cefTRIAXone (ROCEPHIN) 1 g in dextrose 5 % 50 mL IVPB  1 g Intravenous Q24H Rolland Porter, MD   Stopped at 08/22/16 2110  . insulin aspart (novoLOG) injection 0-9 Units  0-9 Units Subcutaneous Q4H Selmer Dominion B, NP   5 Units at 08/22/16 2036  . methylPREDNISolone sodium succinate (SOLU-MEDROL) 125 mg/2 mL injection 60 mg  60 mg Intravenous Q12H Nelda Bucks, MD   60 mg at 08/22/16 2132  . pantoprazole (PROTONIX) injection 40 mg  40 mg Intravenous Q12H Selmer Dominion B, NP        Allergies as of 08/22/2016  . (No Known Allergies)     Review of Systems:    This is positive for those things mentioned in the HPI All other review of systems are negative Limited by mental status and inability to completely collapse right with a history       Physical Exam:  Vital signs in last 24 hours: Temp:  [97.3 F (36.3 C)-97.6 F (36.4 C)] 97.6 F (36.4 C) (06/19 2316) Pulse Rate:  [86-96] 87 (06/19 2216) Resp:  [15-28] 17 (06/19 2216) BP: (72-111)/(47-84) 93/53 (06/19 2216) SpO2:   [94 %-100 %] 100 % (06/19 2216) Weight:  [185 lb (83.9 kg)] 185 lb (83.9 kg) (06/19 1557)    General:  Chronically ill deeply jaundiced Eyes:  icteric. ENT:   Mouth and posterior pharynx dry mucous membranes Neck:   supple w/o thyromegaly or mass.  Lungs: Clear to auscultation bilaterally. Heart:  S1S2, no rubs, murmurs, gallops. Abdomen:  Distended soft, non-tender, no hepatosplenomegaly, hernia, or mass and BS+.  Lymph:  no cervical or supraclavicular adenopathy. Extremities:   No edema there is bruising on the left leg Skin  a few small spider angiomata, upper trunk area, deeply jaundiced Neuro:  He is awake he knew he was in the hospital but that was about it as far as orientation.. I do not detect asterixis Psych:  Tremulous   Data  Reviewed:   LAB RESULTS:  Recent Labs  08/22/16 1558 08/22/16 1946 08/22/16 2138  WBC 39.2*  --   --   HGB 11.3* 10.9* 10.8*  HCT 30.8* 32.0* 29.8*  PLT 147*  --   --    BMET  Recent Labs  08/22/16 1558 08/22/16 1931 08/22/16 1946 08/22/16 2138  NA 114* 115* 115* 114*  K <2.0* 2.1* 2.2* 2.1*  CL 73* 79* 76* 79*  CO2 20* 21*  --  18*  GLUCOSE 304* 269* 271* 252*  BUN 53* 53* 47* 54*  CREATININE 3.15* 3.13* 3.10* 3.18*  CALCIUM 8.7* 7.9*  --  7.8*   LFT  Recent Labs  08/22/16 1558  PROT 5.3*  ALBUMIN 1.9*  AST 123*  ALT 50  ALKPHOS 209*  BILITOT 36.6*  BILIDIR 23.2*  IBILI 13.4*   PT/INR  Recent Labs  08/22/16 1749  LABPROT 19.3*  INR 1.61    STUDIES: Ct Head Wo Contrast  Result Date: 08/22/2016 CLINICAL DATA:  Multiple falls at home. Dizziness with standing. Diabetes. EXAM: CT HEAD WITHOUT CONTRAST TECHNIQUE: Contiguous axial images were obtained from the base of the skull through the vertex without intravenous contrast. COMPARISON:  None. FINDINGS: Brain: Mild cerebral and cerebellar volume loss for age. No mass lesion, hemorrhage, hydrocephalus, acute infarct, intra-axial, or extra-axial fluid collection. No  mass lesion, hemorrhage, hydrocephalus, acute infarct, intra-axial, or extra-axial fluid collection. Vascular: No hyperdense vessel or unexpected calcification. Skull: No significant soft tissue swelling.  No skull fracture. Sinuses/Orbits: Normal imaged portions of the orbits and globes. Hypoplastic right frontal sinus. Other paranasal sinuses and mastoid air cells clear. Other: None. IMPRESSION: No acute intracranial abnormality. Electronically Signed   By: Jeronimo Greaves M.D.   On: 08/22/2016 17:25   US Abdomen Complete  Result Date: 08/22/2016 CLINICAL DATA:  Acute onset of jaundice. Assess for cirrhosis. Initial encounter. EXAM: ABDOMEN ULTRASOUND COMPLETE COMPARISON:  Right upper quadrant ultrasound performed 07/27/2016 FINDINGS: Gallbladder: Gallbladder wall thickening is noted. This is nonspecific in the presence of ascites. Underlying sludge is noted within the gallbladder. No definite stones are seen. No ultrasonographic Murphy's sign is elicited. Common bile duct: Diameter: 0.5 cm, within normal limits in caliber. Liver: A 1.4 cm cyst is noted at the left hepatic lobe. The nodular contour of the liver is compatible with hepatic cirrhosis. Small volume ascites is seen tracking about the liver. IVC: Not well characterized. Pancreas: Not well seen. Spleen: Size and appearance within normal limits. Right Kidney: Length: 12.8 cm. Echogenicity within normal limits. No mass or hydronephrosis visualized. Left Kidney: Length: 11.6 cm. Echogenicity within normal limits. No mass or hydronephrosis visualized. Abdominal aorta: Not characterized due to the patient's habitus. Other findings: None. IMPRESSION: 1. Small volume ascites noted about the liver. Nodular contour of the liver is compatible with hepatic cirrhosis. 2. 1.4 cm cyst at the left hepatic lobe. 3. Gallbladder wall thickening is nonspecific in the presence of ascites. Underlying gallbladder sludge noted. No definite stones seen. Electronically Signed    By: Roanna Raider M.D.   On: 08/22/2016 22:33   Dg Chest Port 1 View  Result Date: 08/22/2016 CLINICAL DATA:  Hepatitis. EXAM: PORTABLE CHEST 1 VIEW COMPARISON:  Radiograph of December 28, 2013. FINDINGS: The heart size and mediastinal contours are within normal limits. Both lungs are clear. No pneumothorax or pleural effusion is noted. Old right rib fracture is noted. IMPRESSION: No acute cardiopulmonary abnormality seen. Electronically Signed   By: Lupita Raider, M.D.  On: 08/22/2016 21:22       Thanks   LOS: 0 days   @Carl  Sena Slate, MD, [facility] @  08/22/2016, 11:20 PM

## 2016-08-22 NOTE — ED Provider Notes (Signed)
[facility]-EMERGENCY DEPT Provider Note   CSN: [id number] Arrival date & time: 08/22/16  1543     History   Chief Complaint Chief Complaint  Patient presents with  . Weakness    HPI Tyler Clarke is a 41 y.o. male.  The history is provided by the patient, medical records and a relative (mother). No language interpreter was used.  Weakness  Primary symptoms comment: Diffuse weakness. This is a recurrent problem. The current episode started more than 1 week ago. The problem has not changed since onset.There was no focality noted. There has been no fever. Pertinent negatives include no shortness of breath, no chest pain and no vomiting. There were no medications administered prior to arrival.    Past Medical History:  Diagnosis Date  . Alcoholic hepatitis with ascites   . Alcoholism (HCC)   . Diet-controlled diabetes mellitus (HCC)   . Polycythemia   . Prepatellar bursitis of right knee 06/2014    Patient Active Problem List   Diagnosis Date Noted  . Liver failure (HCC) 08/22/2016  . Hepatitis   . Alcoholic hepatitis with ascites   . Alcoholic cirrhosis of liver without ascites (HCC)   . Low back pain 07/21/2013  . DIABETES MELLITUS 03/31/2010  . RENAL CALCULUS, HX OF 03/31/2010    Past Surgical History:  Procedure Laterality Date  . KNEE BURSECTOMY Right 06/25/2014   Procedure: RIGHT KNEE PRE-PATELLA BURSECTOMY;  Surgeon: Mckinley Jewel, MD;  Location: [facility];  Service: Orthopedics;  Laterality: Right;  . TONSILLECTOMY AND ADENOIDECTOMY    . WISDOM TOOTH EXTRACTION         Home Medications    Prior to Admission medications   Medication Sig Start Date End Date Taking? Authorizing Provider  amitriptyline (ELAVIL) 10 MG tablet Take 10 mg by mouth at bedtime. 08/18/16  Yes [provider]  fexofenadine (ALLEGRA) 30 MG tablet Take 30 mg by mouth as needed (allergies).   Yes [provider]  ibuprofen (ADVIL,MOTRIN) 200 MG tablet Take 400  mg by mouth every 6 (six) hours as needed for headache or mild pain.   Yes [provider]  metFORMIN (GLUCOPHAGE) 1000 MG tablet Take 500 mg by mouth daily. Takes half tablet 500 mg 08/18/16  Yes [provider]  potassium chloride SA (K-DUR,KLOR-CON) 20 MEQ tablet Take 1 tablet (20 mEq total) by mouth 2 (two) times daily. 07/27/16  Yes Pricilla Loveless, MD  magnesium oxide (MAG-OX) 400 MG tablet Take 1 tablet (400 mg total) by mouth daily. Patient not taking: Reported on 08/22/2016 07/27/16   Pricilla Loveless, MD    Family History Family History  Problem Relation Age of Onset  . Adopted: Yes    Social History Social History  Substance Use Topics  . Smoking status: Current Every Day Smoker    Packs/day: 0.50    Years: 10.00    Types: Cigarettes  . Smokeless tobacco: Never Used  . Alcohol use Yes     Comment: "few drinks/day"     Allergies   Patient has no known allergies.   Review of Systems Review of Systems  Constitutional: Positive for appetite change and fatigue. Negative for chills and fever.  HENT: Negative for ear pain and sore throat.   Eyes: Negative for pain and visual disturbance.  Respiratory: Negative for cough and shortness of breath.   Cardiovascular: Negative for chest pain and palpitations.  Gastrointestinal: Positive for blood in stool. Negative for abdominal pain and vomiting.  Genitourinary: Negative for  dysuria and hematuria.  Musculoskeletal: Negative for arthralgias and back pain.  Skin: Positive for color change (jaundice). Negative for rash.  Neurological: Positive for weakness. Negative for seizures and syncope.  All other systems reviewed and are negative.    Physical Exam Updated Vital Signs BP 103/66   Pulse 85   Temp 97.6 F (36.4 C) (Oral)   Resp (!) 24   Ht 6\' 2"  (1.88 m)   Wt 83.9 kg (185 lb)   SpO2 95%   BMI 23.75 kg/m   Physical Exam  Constitutional: He appears well-developed. He has a sickly appearance. He  appears ill. No distress.  HENT:  Head: Normocephalic and atraumatic.  Eyes: Conjunctivae are normal. Scleral icterus is present.  Neck: Neck supple.  Cardiovascular: Normal rate and regular rhythm.   No murmur heard. Pulmonary/Chest: Effort normal and breath sounds normal. No respiratory distress.  Abdominal: Soft. He exhibits no distension. There is no tenderness. There is no guarding.  Musculoskeletal: He exhibits no edema.  Neurological: He is alert. No cranial nerve deficit.  5/5 motor strength and intact sensation in all extremities. Intact bilateral finger-to-nose coordination  Skin: Skin is warm and dry.  Diffuse jaundiced appearance  Nursing note and vitals reviewed.    ED Treatments / Results  Labs (all labs ordered are listed, but only abnormal results are displayed) Labs Reviewed  BASIC METABOLIC PANEL - Abnormal; Notable for the following:       Result Value   Sodium 114 (*)    Potassium <2.0 (*)    Chloride 73 (*)    CO2 20 (*)    Glucose, Bld 304 (*)    BUN 53 (*)    Creatinine, Ser 3.15 (*)    Calcium 8.7 (*)    GFR calc non Af Amer 23 (*)    GFR calc Af Amer 27 (*)    Anion gap 21 (*)    All other components within normal limits  CBC - Abnormal; Notable for the following:    WBC 39.2 (*)    RBC 3.00 (*)    Hemoglobin 11.3 (*)    HCT 30.8 (*)    MCV 102.7 (*)    MCH 37.7 (*)    MCHC 36.7 (*)    Platelets 147 (*)    All other components within normal limits  HEPATIC FUNCTION PANEL - Abnormal; Notable for the following:    Total Protein 5.3 (*)    Albumin 1.9 (*)    AST 123 (*)    Alkaline Phosphatase 209 (*)    Total Bilirubin 36.6 (*)    Bilirubin, Direct 23.2 (*)    Indirect Bilirubin 13.4 (*)    All other components within normal limits  PROTIME-INR - Abnormal; Notable for the following:    Prothrombin Time 19.3 (*)    All other components within normal limits  AMMONIA - Abnormal; Notable for the following:    Ammonia 72 (*)    All other  components within normal limits  LACTIC ACID, PLASMA - Abnormal; Notable for the following:    Lactic Acid, Venous 3.4 (*)    All other components within normal limits  LACTIC ACID, PLASMA - Abnormal; Notable for the following:    Lactic Acid, Venous 4.2 (*)    All other components within normal limits  AMMONIA - Abnormal; Notable for the following:    Ammonia 100 (*)    All other components within normal limits  BASIC METABOLIC PANEL - Abnormal; Notable for  the following:    Sodium 115 (*)    Potassium 2.1 (*)    Chloride 79 (*)    CO2 21 (*)    Glucose, Bld 269 (*)    BUN 53 (*)    Creatinine, Ser 3.13 (*)    Calcium 7.9 (*)    GFR calc non Af Amer 23 (*)    GFR calc Af Amer 27 (*)    All other components within normal limits  BASIC METABOLIC PANEL - Abnormal; Notable for the following:    Sodium 114 (*)    Potassium 2.1 (*)    Chloride 79 (*)    CO2 18 (*)    Glucose, Bld 252 (*)    BUN 54 (*)    Creatinine, Ser 3.18 (*)    Calcium 7.8 (*)    GFR calc non Af Amer 23 (*)    GFR calc Af Amer 27 (*)    Anion gap 17 (*)    All other components within normal limits  BASIC METABOLIC PANEL - Abnormal; Notable for the following:    Sodium 116 (*)    Potassium 2.3 (*)    Chloride 80 (*)    CO2 19 (*)    Glucose, Bld 202 (*)    BUN 55 (*)    Creatinine, Ser 3.24 (*)    Calcium 7.7 (*)    GFR calc non Af Amer 22 (*)    GFR calc Af Amer 26 (*)    Anion gap 17 (*)    All other components within normal limits  HEMOGLOBIN AND HEMATOCRIT, BLOOD - Abnormal; Notable for the following:    Hemoglobin 10.8 (*)    HCT 29.8 (*)    All other components within normal limits  OSMOLALITY - Abnormal; Notable for the following:    Osmolality 270 (*)    All other components within normal limits  GLUCOSE, CAPILLARY - Abnormal; Notable for the following:    Glucose-Capillary 220 (*)    All other components within normal limits  CBG MONITORING, ED - Abnormal; Notable for the following:      Glucose-Capillary 292 (*)    All other components within normal limits  I-STAT CHEM 8, ED - Abnormal; Notable for the following:    Sodium 115 (*)    Potassium 2.2 (*)    Chloride 76 (*)    BUN 47 (*)    Creatinine, Ser 3.10 (*)    Glucose, Bld 271 (*)    Calcium, Ion 0.96 (*)    Hemoglobin 10.9 (*)    HCT 32.0 (*)    All other components within normal limits  CULTURE, BLOOD (ROUTINE X 2)  CULTURE, BLOOD (ROUTINE X 2)  MRSA PCR SCREENING  MAGNESIUM  PROCALCITONIN  ETHANOL  HCG, QUANTITATIVE, PREGNANCY  CORTISOL  URINALYSIS, ROUTINE W REFLEX MICROSCOPIC  PROCALCITONIN  HEPATITIS PANEL, ACUTE  BASIC METABOLIC PANEL  BASIC METABOLIC PANEL  BASIC METABOLIC PANEL  RAPID URINE DRUG SCREEN, HOSP PERFORMED  HEMOGLOBIN AND HEMATOCRIT, BLOOD  AFP TUMOR MARKER  SODIUM, URINE, RANDOM  OSMOLALITY, URINE  APTT  PROTIME-INR  HEMOGLOBIN AND HEMATOCRIT, BLOOD  HEMOGLOBIN AND HEMATOCRIT, BLOOD  HEMOGLOBIN AND HEMATOCRIT, BLOOD  POC OCCULT BLOOD, ED  TYPE AND SCREEN  ABO/RH    EKG  EKG Interpretation None       Radiology Ct Head Wo Contrast  Result Date: 08/22/2016 CLINICAL DATA:  Multiple falls at home. Dizziness with standing. Diabetes. EXAM: CT HEAD WITHOUT CONTRAST TECHNIQUE: Contiguous axial  images were obtained from the base of the skull through the vertex without intravenous contrast. COMPARISON:  None. FINDINGS: Brain: Mild cerebral and cerebellar volume loss for age. No mass lesion, hemorrhage, hydrocephalus, acute infarct, intra-axial, or extra-axial fluid collection. No mass lesion, hemorrhage, hydrocephalus, acute infarct, intra-axial, or extra-axial fluid collection. Vascular: No hyperdense vessel or unexpected calcification. Skull: No significant soft tissue swelling.  No skull fracture. Sinuses/Orbits: Normal imaged portions of the orbits and globes. Hypoplastic right frontal sinus. Other paranasal sinuses and mastoid air cells clear. Other: None. IMPRESSION: No  acute intracranial abnormality. Electronically Signed   By: Jeronimo Greaves M.D.   On: 08/22/2016 17:25   US Abdomen Complete  Result Date: 08/22/2016 CLINICAL DATA:  Acute onset of jaundice. Assess for cirrhosis. Initial encounter. EXAM: ABDOMEN ULTRASOUND COMPLETE COMPARISON:  Right upper quadrant ultrasound performed 07/27/2016 FINDINGS: Gallbladder: Gallbladder wall thickening is noted. This is nonspecific in the presence of ascites. Underlying sludge is noted within the gallbladder. No definite stones are seen. No ultrasonographic Murphy's sign is elicited. Common bile duct: Diameter: 0.5 cm, within normal limits in caliber. Liver: A 1.4 cm cyst is noted at the left hepatic lobe. The nodular contour of the liver is compatible with hepatic cirrhosis. Small volume ascites is seen tracking about the liver. IVC: Not well characterized. Pancreas: Not well seen. Spleen: Size and appearance within normal limits. Right Kidney: Length: 12.8 cm. Echogenicity within normal limits. No mass or hydronephrosis visualized. Left Kidney: Length: 11.6 cm. Echogenicity within normal limits. No mass or hydronephrosis visualized. Abdominal aorta: Not characterized due to the patient's habitus. Other findings: None. IMPRESSION: 1. Small volume ascites noted about the liver. Nodular contour of the liver is compatible with hepatic cirrhosis. 2. 1.4 cm cyst at the left hepatic lobe. 3. Gallbladder wall thickening is nonspecific in the presence of ascites. Underlying gallbladder sludge noted. No definite stones seen. Electronically Signed   By: Roanna Raider M.D.   On: 08/22/2016 22:33   Dg Chest Port 1 View  Result Date: 08/22/2016 CLINICAL DATA:  Hepatitis. EXAM: PORTABLE CHEST 1 VIEW COMPARISON:  Radiograph of December 28, 2013. FINDINGS: The heart size and mediastinal contours are within normal limits. Both lungs are clear. No pneumothorax or pleural effusion is noted. Old right rib fracture is noted. IMPRESSION: No acute  cardiopulmonary abnormality seen. Electronically Signed   By: Lupita Raider, M.D.   On: 08/22/2016 21:22    Procedures Procedures (including critical care time) Angiocath insertion Performed by: Hebert Soho  Consent: Verbal consent obtained. Risks and benefits: risks, benefits and alternatives were discussed Time out: Immediately prior to procedure a "time out" was called to verify the correct patient, procedure, equipment, support staff and site/side marked as required.  Preparation: Patient was prepped and draped in the usual sterile fashion.  Vein Location: L forearm  Ultrasound Guided  Gauge: 18g  Normal blood return and flush without difficulty Patient tolerance: Patient tolerated the procedure well with no immediate complications.   Medications Ordered in ED Medications  potassium chloride 10 mEq in 100 mL IVPB (0 mEq Intravenous Stopped 08/22/16 2247)  0.9 %  sodium chloride infusion (75 mL/hr Intravenous Rate/Dose Verify 08/22/16 2300)  pantoprazole (PROTONIX) injection 40 mg (40 mg Intravenous Given 08/23/16 0013)  insulin aspart (novoLOG) injection 0-9 Units (3 Units Subcutaneous Given 08/23/16 0009)  albumin human 5 % solution 12.5 g (12.5 g Intravenous Transfusing/Transfer 08/22/16 2248)  cefTRIAXone (ROCEPHIN) 1 g in dextrose 5 % 50 mL IVPB (0 g Intravenous  Stopped 08/22/16 2111)  methylPREDNISolone sodium succinate (SOLU-MEDROL) 125 mg/2 mL injection 60 mg (60 mg Intravenous Given 08/22/16 2132)  lactulose (CHRONULAC) 10 GM/15ML solution 30 g (not administered)  lidocaine (XYLOCAINE) 2 % jelly 1 application (not administered)  magnesium sulfate IVPB 2 g 50 mL (0 g Intravenous Stopped 08/22/16 1922)  octreotide (SANDOSTATIN) 2 mcg/mL load via infusion 50 mcg (50 mcg Intravenous Bolus from Bag 08/22/16 1845)  pantoprazole (PROTONIX) 80 mg in sodium chloride 0.9 % 100 mL IVPB (0 mg Intravenous Stopped 08/22/16 1923)  sodium chloride 0.9 % 1,000 mL with thiamine 100 mg, folic  acid 1 mg, multivitamins adult 10 mL infusion ( Intravenous Rate/Dose Verify 08/22/16 2300)     Initial Impression / Assessment and Plan / ED Course  I have reviewed the triage vital signs and the nursing notes.  Pertinent labs & imaging results that were available during my care of the patient were reviewed by me and considered in my medical decision making (see chart for details).     41 year old male history of alcohol abuse, hyperbilirubinemia, and diabetes who presents from PCP for hypotension. Normal BP 120s/80s, Today 80s/50s on multiple rechecks. Pt describes 1 week of tarry black stools. POC occult with grossly melanotic stool. Recently seen on 07/27/16 for hypokalemia, hyperbilirubeinemia, and elevated LFTs. RUQ U/S unremarkable. Pt and mother describe multiple falls at home and hitting head, most recently this AM. Pt also with notable jaundice over past several weeks. He is a 6 beer per day drinker. Recent poor PO intake. No recent fevers.  AF, hypotensive 80s/50s on multiple rechecks. Alert, no focal neuro deficits. Diffusely jaundiced. Rectal exam with melanotic stools. Abdomen soft, benign throughout.  Concern for hemorrhagic shock from GI bleed with hypotension and melanotic stools. Octreotide and protonix bolus/gtt started. Additional USIV placed. IVF bolus given.  Labs grossly deranged with Na 114, K<2, Cl 73, CO2 20, Glu 304, BUN 53, Crt 3.15, Ca 8.7, Tbili 36. Chem 8 showing consistent values. Ammonia 72.  Suspect acute liver failure. MELD score of 38 points with estimated 3 month mortality of 52.6%. BP improved to 100s/60s with IVF.  Pt admitted to ICU for further management and evaluation. Pt stable at time of transfer.  Final Clinical Impressions(s) / ED Diagnoses   Final diagnoses:  Cirrhosis [facility])    New Prescriptions Current Discharge Medication List       Hebert Soho, MD 08/24/16 0118    Rolland Porter, MD 09/03/16 2342

## 2016-08-22 NOTE — ED Notes (Signed)
Dr. Feinstein at bedside   

## 2016-08-22 NOTE — ED Provider Notes (Addendum)
Pt seen and evaluated. C/o weakness. Hypotensive. Progressive liver failure. Guaiac +. Encephalopathic with elevated Ammonia. Likely variceal bleed. Started on octreotide bolus and drip. Pressures improved after IVF boluses. Multiple electrolyte abnormalities.   CRITICAL CARE Performed by: Rolland PorterJAMES, Maymie Brunke JOSEPH   Total critical care time: 30 minutes  Critical care time was exclusive of separately billable procedures and treating other patients.  Critical care was necessary to treat or prevent imminent or life-threatening deterioration.  Critical care was time spent personally by me on the following activities: development of treatment plan with patient and/or surrogate as well as nursing, discussions with consultants, evaluation of patient's response to treatment, examination of patient, obtaining history from patient or surrogate, ordering and performing treatments and interventions, ordering and review of laboratory studies, ordering and review of radiographic studies, pulse oximetry and re-evaluation of patient's condition.    Rolland PorterJames, Nevea Spiewak, MD 08/22/16 2119    Rolland PorterJames, Helen Winterhalter, MD 08/23/16 1037

## 2016-08-22 NOTE — ED Notes (Signed)
Pt transported to u/s.  

## 2016-08-22 NOTE — Progress Notes (Signed)
eLink Physician-Brief Progress Note Patient Name: Tyler MtDavid E Vokes DOB: August 29, 1975 MRN: [id number]   Date of Service  08/22/2016  HPI/Events of Note  Urinary retention - request for Coude Catheter.  eICU Interventions  Will order: 1. Insert Coude urinary catheter.      Intervention Category Intermediate Interventions: Other:  Lenell AntuSommer,Matina Rodier Eugene 08/22/2016, 11:26 PM

## 2016-08-22 NOTE — H&P (Addendum)
PULMONARY / CRITICAL CARE MEDICINE   Name: Tyler Clarke MRN: [id number] DOB: Feb 20, 1976    ADMISSION DATE:  08/22/2016 CONSULTATION DATE:  08/22/2016  REFERRING MD:  Dr. Fayrene Fearing  CHIEF COMPLAINT:  Jaundice   HISTORY OF PRESENT ILLNESS:   41 year old male with PMH of current smoker, heavy alcohol abuse, and DM who was sent to the ER today by his primary MD for abnormal labs and slurred speech that started on Wednesday.    Patient reports vaguely not to be feeling well for the last several weeks and reports a yellowish tint to his skin that started several days ago.  He additionally admits to recent falls at home.  Denies any abdominal pain, cough, shortness of breath, fever, chills, nausea, vomiting, diarrhea, bloody stools, weight gain, swelling, or dizziness.  He reports he currently smokes half to one pack of cigarettes a day, drinks 24 beers daily (last drink yesterday), and denies any recreational drug use.  Reports his diabetes was previously diet controlled.    In the ED, he was noted to have significant jaundice and hypotension of 80/51, heart rate 91,afebrile 97.3, and 96% on room air. He has been alert and oriented.  Labs noted for Na 114, K < 2, Cl 20, glucose 304, BUN 53, sCr 3.15 (previously 0.4 on 07/27/16), AG 21, albumin 1.9, AST 123, t. Bili 36.6,  WBC 39.2, Hgb 11.3, Hct 30.8, plts 147, INR 1.61, PT 19.3, other labs pending.  Head CT for recurrent falls was negative. EKG noted for prolonged QTc 0.540.  Stool was guaiac positive.  He was treated with 1L NS, 1 L LR, started on KCL and mag replacement, protonix bolus and drip started, and placed on octreotide.  Systolic blood pressure remains in the 90's.  PCCM to admit to ICU for hepatic and renal dysfunction.    PAST MEDICAL HISTORY :  He  has a past medical history of Diet-controlled diabetes mellitus (HCC); Polycythemia; and Prepatellar bursitis of right knee (06/2014).  PAST SURGICAL HISTORY: He  has a past surgical history that  includes Wisdom tooth extraction; Tonsillectomy and adenoidectomy; and Knee bursectomy (Right, 06/25/2014).  No Known Allergies  No current facility-administered medications on file prior to encounter.    Current Outpatient Prescriptions on File Prior to Encounter  Medication Sig  . potassium chloride SA (K-DUR,KLOR-CON) 20 MEQ tablet Take 1 tablet (20 mEq total) by mouth 2 (two) times daily.  . magnesium oxide (MAG-OX) 400 MG tablet Take 1 tablet (400 mg total) by mouth daily. (Patient not taking: Reported on 08/22/2016)    FAMILY HISTORY:  His is adopted.    SOCIAL HISTORY: He  reports that he has been smoking Cigarettes.  He has a 5.00 pack-year smoking history. He has never used smokeless tobacco. He reports that he drinks alcohol. He reports that he does not use drugs.  REVIEW OF SYSTEMS:  POSITIVES IN BOLD Gen: Denies fever, chills, weight change, fatigue, night sweats, general malaise  HEENT: Denies blurred vision, double vision, hearing loss, tinnitus, sinus congestion, rhinorrhea, sore throat, neck stiffness, dysphagia PULM: Denies shortness of breath, cough, sputum production, hemoptysis, wheezing CV: Denies chest pain, edema, orthopnea, paroxysmal nocturnal dyspnea, palpitations GI: Denies abdominal pain, nausea, vomiting, diarrhea, hematochezia, melena, constipation, change in bowel habits GU: Denies dysuria, hematuria, polyuria, oliguria, urethral discharge Endocrine: Denies hot or cold intolerance, polyuria, polyphagia or appetite change Derm: jaundice, Denies rash, dry skin, scaling or peeling skin change Heme: Denies easy bruising, bleeding, bleeding gums Neuro:  Denies headache, numbness, weakness, slurred speech, falls, loss of memory or consciousness  SUBJECTIVE:   VITAL SIGNS: BP 91/60   Pulse 90   Temp 97.3 F (36.3 C) (Oral)   Resp (!) 22   Ht 6\' 2"  (1.88 m)   Wt 185 lb (83.9 kg)   SpO2 97%   BMI 23.75 kg/m   HEMODYNAMICS:    VENTILATOR SETTINGS:     INTAKE / OUTPUT: No intake/output data recorded.  PHYSICAL EXAMINATION: General:  Acutely ill appearing male lying in ER stretcher HEENT: MM pink/dry, Goodnight/AT, scleral icterus, no appreciable JVD, PERRL Neuro: Alert and oriented, MAE, some slurred speech CV: s1s2 rrr, no m/r/g PULM: even/non-labored, lungs bilaterally clear  GI: protuberant, soft, non-tender, bsx4 active  Extremities: warm/dry, no peripheral edema  Skin: severe jaundice, multiple bruises to lower extremities.   LABS:  BMET  Recent Labs Lab 08/22/16 1558  NA 114*  K <2.0*  CL 73*  CO2 20*  BUN 53*  CREATININE 3.15*  GLUCOSE 304*    Electrolytes  Recent Labs Lab 08/22/16 1558 08/22/16 1749  CALCIUM 8.7*  --   MG  --  2.3    CBC  Recent Labs Lab 08/22/16 1558  WBC 39.2*  HGB 11.3*  HCT 30.8*  PLT 147*    Coag's  Recent Labs Lab 08/22/16 1749  INR 1.61    Sepsis Markers No results for input(s): LATICACIDVEN, PROCALCITON, O2SATVEN in the last 168 hours.  ABG No results for input(s): PHART, PCO2ART, PO2ART in the last 168 hours.  Liver Enzymes  Recent Labs Lab 08/22/16 1558  AST 123*  ALT 50  ALKPHOS 209*  BILITOT 36.6*  ALBUMIN 1.9*    Cardiac Enzymes No results for input(s): TROPONINI, PROBNP in the last 168 hours.  Glucose No results for input(s): GLUCAP in the last 168 hours.  Imaging Ct Head Wo Contrast  Result Date: 08/22/2016 CLINICAL DATA:  Multiple falls at home. Dizziness with standing. Diabetes. EXAM: CT HEAD WITHOUT CONTRAST TECHNIQUE: Contiguous axial images were obtained from the base of the skull through the vertex without intravenous contrast. COMPARISON:  None. FINDINGS: Brain: Mild cerebral and cerebellar volume loss for age. No mass lesion, hemorrhage, hydrocephalus, acute infarct, intra-axial, or extra-axial fluid collection. No mass lesion, hemorrhage, hydrocephalus, acute infarct, intra-axial, or extra-axial fluid collection. Vascular: No  hyperdense vessel or unexpected calcification. Skull: No significant soft tissue swelling.  No skull fracture. Sinuses/Orbits: Normal imaged portions of the orbits and globes. Hypoplastic right frontal sinus. Other paranasal sinuses and mastoid air cells clear. Other: None. IMPRESSION: No acute intracranial abnormality. Electronically Signed   By: Jeronimo Greaves M.D.   On: 08/22/2016 17:25    STUDIES:  6/19 Heat CT >> normal  CULTURES: 6/19 BC >>  ANTIBIOTICS: 6/19 ceftriaxone >>  SIGNIFICANT EVENTS: 6/19 Admit   LINES/TUBES: PIV   DISCUSSION: 41 year male w/ PMH of heavy alcohol abuse (24 beers daily) and DM who was sent to ER for abnormal electrolyte abnormalities, jaundice, slurred speech, and multiple recent falls.    ASSESSMENT / PLAN:  PULMONARY A: Tobacco abuse P:   O2 prn for sats > 94% Smoking cessation   CARDIOVASCULAR A:  Hypotension/ ? shock - unclear etiology sepsis vs hypovolemic/ blood loss vs ?etoh cardiomyopathy P:  ICU monitoring  Trend lactate Goal map > 65 Consider TTE  RENAL A:   AKI - hepatorenal syndrome vs intravascular dehydration Hyponatremia- likely beer potomania  Hypokalemia Hypomagnesium Hypoalbuminemia AGMA - lactate vs ketoacidosis vs renal  failure P:   NS at 100 ml/lhr  S/p 4 runs KCL S/p Mag 2 gm Awaiting UA for ketones Trend BMP q 2 hrs Trend urinary output/ strict I/O's Replace electrolytes as indicated Avoid nephrotoxic agents, ensure adequate renal perfusion  GASTROINTESTINAL A:   Acute alcoholic hepatitis - MELD score 36 Alcohol abuse - heavy, 24 beers daily, last drink yesterday  R/o GI bleed - low suspicion given no vomiting or melena  P:   NPO Check ETOH, acute hepatitis panel, ammonia level, Hcg, AFP Trend H/H q 6 Consult GI, spoke w/ Dr. Leone PayorGessner.  GI to see tomorrow Obtain US abd to assess amount of ascites Will start SBP treatment with ceftriaxone  Consider diagnostic paracentesis prior to SBP treatment -  send fluid for cell count, culture, and albumin Will start albumin 5% q 6hr  D/c octreotide drip D/c protonix drip, change to 40mg  IV BID   HEMATOLOGIC A:   Anemia Thrombocytopenia- mild P:  Trend CBC SCDs  INFECTIOUS A:   Leukocytosis-  ? From acute hepatitis  P:   Trend WBC Monitor fever curve  Trend PCT    ENDOCRINE A:   DM  P:   SSI  CBG q 4 Check HA1c  NEUROLOGIC A:  Falls Slurred speech-  no focal deficits  ? Hepatic encephalopathy - head CT neg P:   Minimize sedation Monitor neuro status  Checking UDS Awaiting ammonia    FAMILY  - Updates: No family at bedside.    - Inter-disciplinary family meet or Palliative Care meeting due by:  6/26  CCT: 60 mins  Posey BoyerBrooke Simpson, AGACNP-BC [facility] Pgr: [phone] or if no answer [phone] 08/22/2016, 7:17 PM   STAFF NOTE: I, Rory Percyaniel Ozzie Remmers, MD FACP have personally reviewed patient's available data, including medical history, events of note, physical examination and test results as part of my evaluation. I have discussed with resident/NP and other care providers such as pharmacist, RN and RRT. In addition, I personally evaluated patient and elicited key findings of:  Awake, slight tremor, oriented x 3, known's trump is president, bright yellow throughout and sclera, jvd down, slight ronchi scattered, abdo slight distended, nontender, no r/g, no edema, he gives a history to me of 8 days increasing yellow to skin, has acute hepatitis likely etoh related, will r/o other causes, will need to also treat empiric sbp and consider para diagnostic only, order US abdo, ensure acute hep panel, with borderline BP and until re rule out SBP will add albumin, obtain pcxr with scattered ronchi, would favor empiric steroids for etoh related hepatitis for  Now, also with hypotension SBP risk, add empiric ctx, if he declines will para diag and add vanc, as far as his renal failure, r/ol ATN / hypovolemia vs  hepato renal, dc LR add saline, follow na closely, k supp then follow up BMET, obtain renal US, place foley  He has renal failure and NO output noted since he has been here in ER<, obtain urein na, he is high risk etoh wd, low threshold for lactulose / may need precedex ( favor over benzo with bili 36), MELD noted, with associated renal failure, high risk death, started a discussion with him for code status, esnure thimaine, folic, MVI, admit to icu , may need cvl, assess pct trend, not impressed GI bleed, ppi bid, cbc follow up, vit K x 1 may be needed, follow inr in am   With US abdo in MAY, assess alpha feta protein and  cytology if para done The patient is critically ill with multiple organ systems failure and requires high complexity decision making for assessment and support, frequent evaluation and titration of therapies, application of advanced monitoring technologies and extensive interpretation of multiple databases.   Critical Care Time devoted to patient care services described in this note is 35 Minutes. This time reflects time of care of this signee: Rory Percy, MD FACP. This critical care time does not reflect procedure time, or teaching time or supervisory time of PA/NP/Med student/Med Resident etc but could involve care discussion time. Rest per NP/medical resident whose note is outlined above and that I agree with   Mcarthur Rossetti. Tyson Alias, MD, FACP Pgr: [phone] [facility] 08/22/2016 8:11 PM

## 2016-08-22 NOTE — ED Notes (Signed)
Patient transported to CT 

## 2016-08-22 NOTE — ED Notes (Signed)
I-stat Chem 8 Results given to MD Fayrene FearingJames

## 2016-08-22 NOTE — Progress Notes (Signed)
eLink Physician-Brief Progress Note Patient Name: Tyler Clarke DOB: November 13, 1975 MRN: [id number]   Date of Service  08/22/2016  HPI/Events of Note  Request for urethral lidocaine for Coude catheter insertion.   eICU Interventions  Will order: 1. Lidocaine jelly 2% for urethral application.      Intervention Category Major Interventions: Other:  Lenell AntuSommer,Noreen Mackintosh Eugene 08/22/2016, 11:47 PM

## 2016-08-22 NOTE — ED Triage Notes (Addendum)
Per Pt, Pt sent from MD office for abnormal labs and slurred speech that started on Wednesday. Pt alert and oriented x 4 upon arrival to the ED. Slurred speech noted. Denies unilateral weakness, but reports general weakness and bilateral leg pain. Pt is noted to have significant yellow tint to skin and sclera. Pt denies having hx of the same.

## 2016-08-23 ENCOUNTER — Inpatient Hospital Stay (HOSPITAL_COMMUNITY): Payer: Medicaid Other

## 2016-08-23 DIAGNOSIS — K7041 Alcoholic hepatic failure with coma: Secondary | ICD-10-CM

## 2016-08-23 DIAGNOSIS — K767 Hepatorenal syndrome: Secondary | ICD-10-CM

## 2016-08-23 DIAGNOSIS — N179 Acute kidney failure, unspecified: Secondary | ICD-10-CM

## 2016-08-23 DIAGNOSIS — G9340 Encephalopathy, unspecified: Secondary | ICD-10-CM

## 2016-08-23 DIAGNOSIS — R791 Abnormal coagulation profile: Secondary | ICD-10-CM

## 2016-08-23 DIAGNOSIS — L8990 Pressure ulcer of unspecified site, unspecified stage: Secondary | ICD-10-CM | POA: Insufficient documentation

## 2016-08-23 LAB — BASIC METABOLIC PANEL
Anion gap: 17 — ABNORMAL HIGH (ref 5–15)
Anion gap: 13 (ref 5–15)
Anion gap: 13 (ref 5–15)
BUN: 55 mg/dL — ABNORMAL HIGH (ref 6–20)
BUN: 52 mg/dL — ABNORMAL HIGH (ref 6–20)
BUN: 53 mg/dL — ABNORMAL HIGH (ref 6–20)
CALCIUM: 7.7 mg/dL — AB (ref 8.9–10.3)
CALCIUM: 7.3 mg/dL — AB (ref 8.9–10.3)
CALCIUM: 7.4 mg/dL — AB (ref 8.9–10.3)
CO2: 19 mmol/L — AB (ref 22–32)
CO2: 21 mmol/L — AB (ref 22–32)
CO2: 22 mmol/L (ref 22–32)
CREATININE: 3.24 mg/dL — AB (ref 0.61–1.24)
CREATININE: 3.2 mg/dL — AB (ref 0.61–1.24)
CREATININE: 3.23 mg/dL — AB (ref 0.61–1.24)
Chloride: 80 mmol/L — ABNORMAL LOW (ref 101–111)
Chloride: 83 mmol/L — ABNORMAL LOW (ref 101–111)
Chloride: 83 mmol/L — ABNORMAL LOW (ref 101–111)
GFR calc Af Amer: 26 mL/min — ABNORMAL LOW (ref >60)
GFR calc non Af Amer: 22 mL/min — ABNORMAL LOW (ref >60)
GFR calc non Af Amer: 23 mL/min — ABNORMAL LOW (ref >60)
GFR calc non Af Amer: 23 mL/min — ABNORMAL LOW (ref >60)
GFR, EST AFRICAN AMERICAN: 26 mL/min — AB (ref >60)
GFR, EST AFRICAN AMERICAN: 26 mL/min — AB (ref >60)
GLUCOSE: 202 mg/dL — AB (ref 65–99)
GLUCOSE: 188 mg/dL — AB (ref 65–99)
GLUCOSE: 190 mg/dL — AB (ref 65–99)
Potassium: 2.3 mmol/L — CL (ref 3.5–5.1)
Potassium: 2.2 mmol/L — CL (ref 3.5–5.1)
Potassium: 2.2 mmol/L — CL (ref 3.5–5.1)
Sodium: 116 mmol/L — CL (ref 135–145)
Sodium: 117 mmol/L — CL (ref 135–145)
Sodium: 118 mmol/L — CL (ref 135–145)

## 2016-08-23 LAB — MAGNESIUM: MAGNESIUM: 2.6 mg/dL — AB (ref 1.7–2.4)

## 2016-08-23 LAB — GLUCOSE, CAPILLARY
GLUCOSE-CAPILLARY: 178 mg/dL — AB (ref 65–99)
GLUCOSE-CAPILLARY: 243 mg/dL — AB (ref 65–99)
Glucose-Capillary: 132 mg/dL — ABNORMAL HIGH (ref 65–99)
Glucose-Capillary: 204 mg/dL — ABNORMAL HIGH (ref 65–99)
Glucose-Capillary: 232 mg/dL — ABNORMAL HIGH (ref 65–99)
Glucose-Capillary: 248 mg/dL — ABNORMAL HIGH (ref 65–99)

## 2016-08-23 LAB — CBC WITH DIFFERENTIAL/PLATELET
BASOS ABS: 0 10*3/uL (ref 0.0–0.1)
Basophils Relative: 0 %
EOS ABS: 0 10*3/uL (ref 0.0–0.7)
Eosinophils Relative: 0 %
HCT: 26.9 % — ABNORMAL LOW (ref 39.0–52.0)
Hemoglobin: 9.7 g/dL — ABNORMAL LOW (ref 13.0–17.0)
LYMPHS ABS: 1.4 10*3/uL (ref 0.7–4.0)
LYMPHS PCT: 4 %
MCH: 37.5 pg — ABNORMAL HIGH (ref 26.0–34.0)
MCHC: 36.1 g/dL — AB (ref 30.0–36.0)
MCV: 103.9 fL — ABNORMAL HIGH (ref 78.0–100.0)
MONO ABS: 1.1 10*3/uL — AB (ref 0.1–1.0)
Monocytes Relative: 3 %
NEUTROS ABS: 33.3 10*3/uL — AB (ref 1.7–7.7)
Neutrophils Relative %: 93 %
PLATELETS: 102 10*3/uL — AB (ref 150–400)
RBC: 2.59 MIL/uL — ABNORMAL LOW (ref 4.22–5.81)
RDW: 13 % (ref 11.5–15.5)
WBC: 35.8 10*3/uL — ABNORMAL HIGH (ref 4.0–10.5)

## 2016-08-23 LAB — URINALYSIS, ROUTINE W REFLEX MICROSCOPIC
Glucose, UA: 50 mg/dL — AB
KETONES UR: NEGATIVE mg/dL
LEUKOCYTES UA: NEGATIVE
Nitrite: NEGATIVE
PROTEIN: NEGATIVE mg/dL
Specific Gravity, Urine: 1.014 (ref 1.005–1.030)
pH: 5 (ref 5.0–8.0)

## 2016-08-23 LAB — PROCALCITONIN: PROCALCITONIN: 5.14 ng/mL

## 2016-08-23 LAB — PROTIME-INR
INR: 1.82
Prothrombin Time: 21.4 seconds — ABNORMAL HIGH (ref 11.4–15.2)

## 2016-08-23 LAB — HEMOGLOBIN AND HEMATOCRIT, BLOOD
HCT: 26.2 % — ABNORMAL LOW (ref 39.0–52.0)
Hemoglobin: 9.7 g/dL — ABNORMAL LOW (ref 13.0–17.0)

## 2016-08-23 LAB — MRSA PCR SCREENING: MRSA by PCR: NEGATIVE

## 2016-08-23 LAB — APTT: APTT: 42 s — AB (ref 24–36)

## 2016-08-23 LAB — HCG, QUANTITATIVE, PREGNANCY: hCG, Beta Chain, Quant, S: 2 m[IU]/mL (ref <5)

## 2016-08-23 LAB — RAPID URINE DRUG SCREEN, HOSP PERFORMED
Amphetamines: NOT DETECTED
BARBITURATES: NOT DETECTED
Benzodiazepines: NOT DETECTED
Cocaine: NOT DETECTED
Opiates: NOT DETECTED
Tetrahydrocannabinol: NOT DETECTED

## 2016-08-23 LAB — SODIUM, URINE, RANDOM: Sodium, Ur: <10 mmol/L

## 2016-08-23 LAB — OSMOLALITY, URINE
OSMOLALITY UR: 304 mosm/kg (ref 300–900)
Osmolality, Ur: 306 mOsm/kg (ref 300–900)

## 2016-08-23 MED ORDER — FOLIC ACID 5 MG/ML IJ SOLN
1.0000 mg | Freq: Every day | INTRAMUSCULAR | Status: DC
  Administered 2016-08-23 – 2016-08-26 (×4): 1 mg via INTRAVENOUS
  Filled 2016-08-23 (×4): qty 0.2

## 2016-08-23 MED ORDER — IPRATROPIUM-ALBUTEROL 0.5-2.5 (3) MG/3ML IN SOLN
3.0000 mL | RESPIRATORY_TRACT | Status: DC | PRN
  Administered 2016-08-23 – 2016-08-24 (×2): 3 mL via RESPIRATORY_TRACT
  Filled 2016-08-23 (×2): qty 3

## 2016-08-23 MED ORDER — MIDAZOLAM HCL 2 MG/2ML IJ SOLN
1.0000 mg | INTRAMUSCULAR | Status: DC | PRN
Start: 2016-08-23 — End: 2016-08-23

## 2016-08-23 MED ORDER — INSULIN ASPART 100 UNIT/ML ~~LOC~~ SOLN
0.0000 [IU] | SUBCUTANEOUS | Status: DC
  Administered 2016-08-23 (×3): 5 [IU] via SUBCUTANEOUS
  Administered 2016-08-24 (×2): 2 [IU] via SUBCUTANEOUS
  Administered 2016-08-24: 3 [IU] via SUBCUTANEOUS
  Administered 2016-08-24 (×2): 2 [IU] via SUBCUTANEOUS
  Administered 2016-08-25 (×4): 3 [IU] via SUBCUTANEOUS
  Administered 2016-08-26: 5 [IU] via SUBCUTANEOUS
  Administered 2016-08-26: 3 [IU] via SUBCUTANEOUS
  Administered 2016-08-26: 5 [IU] via SUBCUTANEOUS
  Administered 2016-08-26 (×2): 3 [IU] via SUBCUTANEOUS
  Administered 2016-08-27: 8 [IU] via SUBCUTANEOUS
  Administered 2016-08-27: 3 [IU] via SUBCUTANEOUS
  Administered 2016-08-27: 2 [IU] via SUBCUTANEOUS

## 2016-08-23 MED ORDER — POTASSIUM CHLORIDE 10 MEQ/100ML IV SOLN
10.0000 meq | INTRAVENOUS | Status: AC
  Administered 2016-08-23 (×4): 10 meq via INTRAVENOUS
  Filled 2016-08-23 (×4): qty 100

## 2016-08-23 MED ORDER — LORAZEPAM 2 MG/ML IJ SOLN
1.0000 mg | INTRAMUSCULAR | Status: DC | PRN
  Administered 2016-08-23 (×2): 2 mg via INTRAVENOUS
  Administered 2016-08-24: 1 mg via INTRAVENOUS
  Administered 2016-08-24 – 2016-09-03 (×11): 2 mg via INTRAVENOUS
  Administered 2016-09-05: 1 mg via INTRAVENOUS
  Filled 2016-08-23 (×18): qty 1

## 2016-08-23 MED ORDER — ALBUMIN HUMAN 25 % IV SOLN
25.0000 g | Freq: Three times a day (TID) | INTRAVENOUS | Status: DC
  Administered 2016-08-23 – 2016-08-25 (×6): 25 g via INTRAVENOUS
  Filled 2016-08-23 (×5): qty 100
  Filled 2016-08-23 (×3): qty 50

## 2016-08-23 MED ORDER — MIDODRINE HCL 5 MG PO TABS
5.0000 mg | ORAL_TABLET | Freq: Three times a day (TID) | ORAL | Status: DC
  Administered 2016-08-23 – 2016-08-27 (×12): 5 mg via ORAL
  Filled 2016-08-23 (×12): qty 1

## 2016-08-23 MED ORDER — DEXTROSE 5 % IV SOLN
2.0000 g | INTRAVENOUS | Status: AC
  Administered 2016-08-23 – 2016-08-28 (×6): 2 g via INTRAVENOUS
  Filled 2016-08-23 (×7): qty 2

## 2016-08-23 MED ORDER — OCTREOTIDE ACETATE 100 MCG/ML IJ SOLN
200.0000 ug | Freq: Three times a day (TID) | INTRAMUSCULAR | Status: DC
  Administered 2016-08-23 – 2016-08-26 (×12): 200 ug via SUBCUTANEOUS
  Filled 2016-08-23 (×16): qty 2

## 2016-08-23 MED ORDER — ADULT MULTIVITAMIN W/MINERALS CH
1.0000 | ORAL_TABLET | Freq: Every day | ORAL | Status: DC
  Administered 2016-08-23 – 2016-09-04 (×12): 1 via ORAL
  Filled 2016-08-23 (×12): qty 1

## 2016-08-23 MED ORDER — ALBUMIN HUMAN 25 % IV SOLN
25.0000 g | Freq: Three times a day (TID) | INTRAVENOUS | Status: DC
  Filled 2016-08-23: qty 100

## 2016-08-23 MED ORDER — GLUCERNA SHAKE PO LIQD
237.0000 mL | Freq: Three times a day (TID) | ORAL | Status: DC
Start: 2016-08-23 — End: 2016-09-06
  Administered 2016-08-23: 237 mL via ORAL
  Administered 2016-08-24: 25 mL via ORAL
  Administered 2016-08-25 – 2016-09-06 (×17): 237 mL via ORAL
  Filled 2016-08-23: qty 237

## 2016-08-23 MED ORDER — THIAMINE HCL 100 MG/ML IJ SOLN
100.0000 mg | Freq: Every day | INTRAMUSCULAR | Status: DC
  Administered 2016-08-23 – 2016-08-26 (×4): 100 mg via INTRAVENOUS
  Filled 2016-08-23: qty 2
  Filled 2016-08-23 (×2): qty 1
  Filled 2016-08-23: qty 2

## 2016-08-23 NOTE — Progress Notes (Signed)
CRITICAL VALUE ALERT  Critical Value:  K 2.2 and Na 118  Date & Time Notied:  08/23/16   Provider Notified: Dr. Dellie CatholicSommers  Orders Received/Actions taken: orders pending

## 2016-08-23 NOTE — Consult Note (Addendum)
[facility] Consult Note     Date: 08/23/2016                  Patient Name:  Tyler Clarke  MRN: [id number]  DOB: August 21, 1975  Age / Sex: 41 y.o., male         PCP: Tyler Mask, MD                 Service Requesting Consult: PCCM; Dr. Molli Clarke                 Reason for Consult: AKI in the setting of acute alcoholic hepatitis            Chief Complaint: falls/weakness/jaundice  HPI: Pt is a 71M with DM II and heavy EtOH abuse who is now seen in consultation at the request of Dr. Molli Clarke of PCCM for evaluation and recommendations surrounding AKI in the setting of acute alcoholic hepatitis.    Briefly, pt has a longstanding history of heavy EtOH abuse.  His mother relays to me that he grew up watching his father drink heavily.  First drink around 19 or 20.  Baseline EtOH consumption 2 six packs daily.  In February, his wife left him for unclear reasons, taking their 39 year old daughter Tyler Clarke) with her.  He has been consuming EtOH much more heavily after that.  Pt's mom says that she first started noticing his jaundice about a week ago.  He went to the ED on 5/24 with hypokalemia and hypomagnesemia and had IV repletion.  He presented yesterday with abnormal LFTs and increasing jaundice on the advice of his family physician.    Pt is currently confused and can't hold a detailed conversation.  He reports pain in his LE.    He was guiac + in the ED and so was started on an octreotide gtt.    Baseline Cr < 0.5, now up to the mid-3s.  Making some urine.    Abd U/S reveals cirrhosis.  Past Medical History:  Diagnosis Date  . Alcoholic hepatitis with ascites   . Alcoholism (HCC)   . Diet-controlled diabetes mellitus (HCC)   . Polycythemia   . Prepatellar bursitis of right knee 06/2014    Past Surgical History:  Procedure Laterality Date  . KNEE BURSECTOMY Right 06/25/2014   Procedure: RIGHT KNEE PRE-PATELLA BURSECTOMY;  Surgeon: Tyler Jewel, MD;   Location:  [facility];  Service: Orthopedics;  Laterality: Right;  . TONSILLECTOMY AND ADENOIDECTOMY    . WISDOM TOOTH EXTRACTION      Family History  Problem Relation Age of Onset  . Adopted: Yes   Social History:  reports that he has been smoking Cigarettes.  He has a 5.00 pack-year smoking history. He has never used smokeless tobacco. He reports that he drinks alcohol. He reports that he does not use drugs.  Allergies: No Known Allergies  Medications Prior to Admission  Medication Sig Dispense Refill  . amitriptyline (ELAVIL) 10 MG tablet Take 10 mg by mouth at bedtime.  0  . fexofenadine (ALLEGRA) 30 MG tablet Take 30 mg by mouth as needed (allergies).    Marland Kitchen ibuprofen (ADVIL,MOTRIN) 200 MG tablet Take 400 mg by mouth every 6 (six) hours as needed for headache or mild pain.    . metFORMIN (GLUCOPHAGE) 1000 MG tablet Take 500 mg by mouth daily. Takes half tablet 500 mg  0  . potassium chloride SA (K-DUR,KLOR-CON) 20 MEQ tablet Take 1 tablet (20 mEq  total) by mouth 2 (two) times daily. 8 tablet 0  . magnesium oxide (MAG-OX) 400 MG tablet Take 1 tablet (400 mg total) by mouth daily. (Patient not taking: Reported on 08/22/2016) 7 tablet 0    Results for orders placed or performed during the hospital encounter of 08/22/16 (from the past 48 hour(s))  Basic metabolic panel     Status: Abnormal   Collection Time: 08/22/16  3:58 PM  Result Value Ref Range   Sodium 114 (LL) 135 - 145 mmol/L    Comment: CRITICAL RESULT CALLED TO, READ BACK BY AND VERIFIED WITH: Tyler Clarke,C RN 08/22/2016 1659 Tyler Clarke    Potassium <2.0 (LL) 3.5 - 5.1 mmol/L    Comment: CRITICAL RESULT CALLED TO, READ BACK BY AND VERIFIED WITH: Tyler Clarke,C RN 08/22/2016 1659 Tyler Clarke    Chloride 73 (L) 101 - 111 mmol/L   CO2 20 (L) 22 - 32 mmol/L   Glucose, Bld 304 (H) 65 - 99 mg/dL   BUN 53 (H) 6 - 20 mg/dL   Creatinine, Ser 3.15 (H) 0.61 - 1.24 mg/dL   Calcium 8.7 (L) 8.9 - 10.3 mg/dL   GFR calc non Af Amer  23 (L) >60 mL/min   GFR calc Af Amer 27 (L) >60 mL/min    Comment: (NOTE) The eGFR has been calculated using the CKD EPI equation. This calculation has not been validated in all clinical situations. eGFR's persistently <60 mL/min signify possible Chronic Kidney Disease.    Anion gap 21 (H) 5 - 15    Comment: RESULT CHECKED  CBC     Status: Abnormal   Collection Time: 08/22/16  3:58 PM  Result Value Ref Range   WBC 39.2 (H) 4.0 - 10.5 K/uL   RBC 3.00 (L) 4.22 - 5.81 MIL/uL   Hemoglobin 11.3 (L) 13.0 - 17.0 g/dL   HCT 30.8 (L) 39.0 - 52.0 %   MCV 102.7 (H) 78.0 - 100.0 fL   MCH 37.7 (H) 26.0 - 34.0 pg   MCHC 36.7 (H) 30.0 - 36.0 g/dL   RDW 12.8 11.5 - 15.5 %   Platelets 147 (L) 150 - 400 K/uL  Hepatic function panel     Status: Abnormal   Collection Time: 08/22/16  3:58 PM  Result Value Ref Range   Total Protein 5.3 (L) 6.5 - 8.1 g/dL   Albumin 1.9 (L) 3.5 - 5.0 g/dL   AST 123 (H) 15 - 41 U/L   ALT 50 17 - 63 U/L   Alkaline Phosphatase 209 (H) 38 - 126 U/L   Total Bilirubin 36.6 (HH) 0.3 - 1.2 mg/dL    Comment: RESULTS CONFIRMED BY MANUAL DILUTION CRITICAL RESULT CALLED TO, READ BACK BY AND VERIFIED WITH: Tyler Clarke,C RN 08/22/2016 1839 Tyler Clarke    Bilirubin, Direct 23.2 (H) 0.1 - 0.5 mg/dL    Comment: RESULTS CONFIRMED BY MANUAL DILUTION   Indirect Bilirubin 13.4 (H) 0.3 - 0.9 mg/dL  Type and screen [place]     Status: None   Collection Time: 08/22/16  5:26 PM  Result Value Ref Range   ABO/RH(D) A POS    Antibody Screen NEG    Sample Expiration 08/25/2016   Magnesium     Status: None   Collection Time: 08/22/16  5:49 PM  Result Value Ref Range   Magnesium 2.3 1.7 - 2.4 mg/dL  Protime-INR     Status: Abnormal   Collection Time: 08/22/16  5:49 PM  Result Value Ref Range   Prothrombin Time 19.3 (H)  11.4 - 15.2 seconds   INR 1.61   Ammonia     Status: Abnormal   Collection Time: 08/22/16  5:49 PM  Result Value Ref Range   Ammonia 72 (H) 9 - 35  umol/L  ABO/Rh     Status: None   Collection Time: 08/22/16  5:49 PM  Result Value Ref Range   ABO/RH(D) A POS   Lactic acid, plasma     Status: Abnormal   Collection Time: 08/22/16  7:31 PM  Result Value Ref Range   Lactic Acid, Venous 3.4 (HH) 0.5 - 1.9 mmol/L    Comment: CRITICAL RESULT CALLED TO, READ BACK BY AND VERIFIED WITH: C.FELTS,RN 2101 08/22/16 G.[email or web address]   Ammonia     Status: Abnormal   Collection Time: 08/22/16  7:31 PM  Result Value Ref Range   Ammonia 100 (H) 9 - 35 umol/L  Procalcitonin - Baseline     Status: None   Collection Time: 08/22/16  7:31 PM  Result Value Ref Range   Procalcitonin 5.07 ng/mL    Comment:        Interpretation: PCT > 2 ng/mL: Systemic infection (sepsis) is likely, unless other causes are known. (NOTE)         ICU PCT Algorithm               Non ICU PCT Algorithm    ----------------------------     ------------------------------         PCT < 0.25 ng/mL                 PCT < 0.1 ng/mL     Stopping of antibiotics            Stopping of antibiotics       strongly encouraged.               strongly encouraged.    ----------------------------     ------------------------------       PCT level decrease by               PCT < 0.25 ng/mL       >= 80% from peak PCT       OR PCT 0.25 - 0.5 ng/mL          Stopping of antibiotics                                             encouraged.     Stopping of antibiotics           encouraged.    ----------------------------     ------------------------------       PCT level decrease by              PCT >= 0.25 ng/mL       < 80% from peak PCT        AND PCT >= 0.5 ng/mL            Continuing antibiotics                                               encouraged.       Continuing antibiotics            encouraged.    ----------------------------     ------------------------------  PCT level increase compared          PCT > 0.5 ng/mL         with peak PCT AND          PCT >= 0.5 ng/mL              Escalation of antibiotics                                          strongly encouraged.      Escalation of antibiotics        strongly encouraged.   Basic metabolic panel     Status: Abnormal   Collection Time: 08/22/16  7:31 PM  Result Value Ref Range   Sodium 115 (LL) 135 - 145 mmol/L    Comment: CRITICAL RESULT CALLED TO, READ BACK BY AND VERIFIED WITH: ARMSTRONG,J RN 08/22/2016 1937 Tyler Clarke    Potassium 2.1 (LL) 3.5 - 5.1 mmol/L    Comment: CRITICAL RESULT CALLED TO, READ BACK BY AND VERIFIED WITH: Tyler Clarke,C RN 08/22/2016 2038 Tyler Clarke    Chloride 79 (L) 101 - 111 mmol/L   CO2 21 (L) 22 - 32 mmol/L   Glucose, Bld 269 (H) 65 - 99 mg/dL   BUN 53 (H) 6 - 20 mg/dL   Creatinine, Ser 3.13 (H) 0.61 - 1.24 mg/dL   Calcium 7.9 (L) 8.9 - 10.3 mg/dL   GFR calc non Af Amer 23 (L) >60 mL/min   GFR calc Af Amer 27 (L) >60 mL/min    Comment: (NOTE) The eGFR has been calculated using the CKD EPI equation. This calculation has not been validated in all clinical situations. eGFR's persistently <60 mL/min signify possible Chronic Kidney Disease.    Anion gap 15 5 - 15  Ethanol     Status: None   Collection Time: 08/22/16  7:31 PM  Result Value Ref Range   Alcohol, Ethyl (B) <5 <5 mg/dL    Comment:        LOWEST DETECTABLE LIMIT FOR SERUM ALCOHOL IS 5 mg/dL FOR MEDICAL PURPOSES ONLY   I-Stat Chem 8, ED     Status: Abnormal   Collection Time: 08/22/16  7:46 PM  Result Value Ref Range   Sodium 115 (LL) 135 - 145 mmol/L   Potassium 2.2 (LL) 3.5 - 5.1 mmol/L   Chloride 76 (L) 101 - 111 mmol/L   BUN 47 (H) 6 - 20 mg/dL   Creatinine, Ser 3.10 (H) 0.61 - 1.24 mg/dL   Glucose, Bld 271 (H) 65 - 99 mg/dL   Calcium, Ion 0.96 (L) 1.15 - 1.40 mmol/L   TCO2 23 0 - 100 mmol/L   Hemoglobin 10.9 (L) 13.0 - 17.0 g/dL   HCT 32.0 (L) 39.0 - 52.0 %   Comment NOTIFIED PHYSICIAN   CBG monitoring, ED     Status: Abnormal   Collection Time: 08/22/16  8:31 PM  Result Value Ref Range    Glucose-Capillary 292 (H) 65 - 99 mg/dL  Lactic acid, plasma     Status: Abnormal   Collection Time: 08/22/16  9:36 PM  Result Value Ref Range   Lactic Acid, Venous 4.2 (HH) 0.5 - 1.9 mmol/L    Comment: CRITICAL RESULT CALLED TO, READ BACK BY AND VERIFIED WITH: WOODSON,B RN 08/22/2016 2313 Tyler Clarke   Basic metabolic panel     Status: Abnormal   Collection Time: 08/22/16  9:38  PM  Result Value Ref Range   Sodium 114 (LL) 135 - 145 mmol/L    Comment: CRITICAL RESULT CALLED TO, READ BACK BY AND VERIFIED WITH: C.FELTS,RN 2240 08/22/16 G.MCADOO    Potassium 2.1 (LL) 3.5 - 5.1 mmol/L    Comment: CRITICAL RESULT CALLED TO, READ BACK BY AND VERIFIED WITH: C.FELTS,RN 2240 08/22/16 G.MCADOO    Chloride 79 (L) 101 - 111 mmol/L   CO2 18 (L) 22 - 32 mmol/L   Glucose, Bld 252 (H) 65 - 99 mg/dL   BUN 54 (H) 6 - 20 mg/dL   Creatinine, Ser 3.18 (H) 0.61 - 1.24 mg/dL   Calcium 7.8 (L) 8.9 - 10.3 mg/dL   GFR calc non Af Amer 23 (L) >60 mL/min   GFR calc Af Amer 27 (L) >60 mL/min    Comment: (NOTE) The eGFR has been calculated using the CKD EPI equation. This calculation has not been validated in all clinical situations. eGFR's persistently <60 mL/min signify possible Chronic Kidney Disease.    Anion gap 17 (H) 5 - 15  Hemoglobin and hematocrit, blood     Status: Abnormal   Collection Time: 08/22/16  9:38 PM  Result Value Ref Range   Hemoglobin 10.8 (L) 13.0 - 17.0 g/dL   HCT 29.8 (L) 39.0 - 52.0 %  Cortisol     Status: None   Collection Time: 08/22/16  9:38 PM  Result Value Ref Range   Cortisol, Plasma 39.6 ug/dL    Comment: (NOTE) AM    6.7 - 22.6 ug/dL PM   <10.0       ug/dL   Osmolality     Status: Abnormal   Collection Time: 08/22/16  9:38 PM  Result Value Ref Range   Osmolality 270 (L) 275 - 295 mOsm/kg  Glucose, capillary     Status: Abnormal   Collection Time: 08/22/16 11:09 PM  Result Value Ref Range   Glucose-Capillary 220 (H) 65 - 99 mg/dL  MRSA PCR Screening     Status:  None   Collection Time: 08/22/16 11:15 PM  Result Value Ref Range   MRSA by PCR NEGATIVE NEGATIVE    Comment:        The GeneXpert MRSA Assay (FDA approved for NASAL specimens only), is one component of a comprehensive MRSA colonization surveillance program. It is not intended to diagnose MRSA infection nor to guide or monitor treatment for MRSA infections.   Basic metabolic panel     Status: Abnormal   Collection Time: 08/22/16 11:26 PM  Result Value Ref Range   Sodium 116 (LL) 135 - 145 mmol/L    Comment: CRITICAL RESULT CALLED TO, READ BACK BY AND VERIFIED WITH: WOODSON,B RN 08/23/2016 0003 Tyler Clarke    Potassium 2.3 (LL) 3.5 - 5.1 mmol/L    Comment: CRITICAL RESULT CALLED TO, READ BACK BY AND VERIFIED WITH: WOODSON,B RN 08/23/2016 0003 Tyler Clarke    Chloride 80 (L) 101 - 111 mmol/L   CO2 19 (L) 22 - 32 mmol/L   Glucose, Bld 202 (H) 65 - 99 mg/dL   BUN 55 (H) 6 - 20 mg/dL   Creatinine, Ser 3.24 (H) 0.61 - 1.24 mg/dL   Calcium 7.7 (L) 8.9 - 10.3 mg/dL   GFR calc non Af Amer 22 (L) >60 mL/min   GFR calc Af Amer 26 (L) >60 mL/min    Comment: (NOTE) The eGFR has been calculated using the CKD EPI equation. This calculation has not been validated in all clinical situations. eGFR's persistently <  60 mL/min signify possible Chronic Kidney Disease.    Anion gap 17 (H) 5 - 15  hCG, quantitative, pregnancy     Status: None   Collection Time: 08/22/16 11:35 PM  Result Value Ref Range   hCG, Beta Chain, Quant, S 2 <5 mIU/mL    Comment:          GEST. AGE      CONC.  (mIU/mL)   <=1 WEEK        5 - 50     2 WEEKS       50 - 500     3 WEEKS       100 - 10,000     4 WEEKS     1,000 - 30,000     5 WEEKS     3,500 - 115,000   6-8 WEEKS     12,000 - 270,000    12 WEEKS     15,000 - 220,000        MALE AND NON-PREGNANT MALE:     LESS THAN 5 mIU/mL   Urine rapid drug screen (hosp performed)     Status: None   Collection Time: 08/23/16  2:35 AM  Result Value Ref Range   Opiates  NONE DETECTED NONE DETECTED   Cocaine NONE DETECTED NONE DETECTED   Benzodiazepines NONE DETECTED NONE DETECTED   Amphetamines NONE DETECTED NONE DETECTED   Tetrahydrocannabinol NONE DETECTED NONE DETECTED   Barbiturates NONE DETECTED NONE DETECTED    Comment:        DRUG SCREEN FOR MEDICAL PURPOSES ONLY.  IF CONFIRMATION IS NEEDED FOR ANY PURPOSE, NOTIFY LAB WITHIN 5 DAYS.        LOWEST DETECTABLE LIMITS FOR URINE DRUG SCREEN Drug Class       Cutoff (ng/mL) Amphetamine      1000 Barbiturate      200 Benzodiazepine   220 Tricyclics       254 Opiates          300 Cocaine          300 THC              50   Urinalysis, Routine w reflex microscopic     Status: Abnormal   Collection Time: 08/23/16  2:36 AM  Result Value Ref Range   Color, Urine AMBER (A) YELLOW    Comment: BIOCHEMICALS MAY BE AFFECTED BY COLOR   APPearance HAZY (A) CLEAR   Specific Gravity, Urine 1.014 1.005 - 1.030   pH 5.0 5.0 - 8.0   Glucose, UA 50 (A) NEGATIVE mg/dL   Hgb urine dipstick MODERATE (A) NEGATIVE   Bilirubin Urine MODERATE (A) NEGATIVE   Ketones, ur NEGATIVE NEGATIVE mg/dL   Protein, ur NEGATIVE NEGATIVE mg/dL   Nitrite NEGATIVE NEGATIVE   Leukocytes, UA NEGATIVE NEGATIVE   RBC / HPF 0-5 0 - 5 RBC/hpf   WBC, UA 0-5 0 - 5 WBC/hpf   Bacteria, UA RARE (A) NONE SEEN   Squamous Epithelial / LPF 0-5 (A) NONE SEEN   Mucous PRESENT   Sodium, urine, random     Status: None   Collection Time: 08/23/16  2:36 AM  Result Value Ref Range   Sodium, Ur <10 mmol/L  Osmolality, urine     Status: None   Collection Time: 08/23/16  2:37 AM  Result Value Ref Range   Osmolality, Ur 306 300 - 900 mOsm/kg  Glucose, capillary     Status: Abnormal   Collection Time: 08/23/16  4:21 AM  Result Value Ref Range   Glucose-Capillary 178 (H) 65 - 99 mg/dL   Comment 1 Notify RN   Procalcitonin     Status: None   Collection Time: 08/23/16  6:03 AM  Result Value Ref Range   Procalcitonin 5.14 ng/mL    Comment:         Interpretation: PCT > 2 ng/mL: Systemic infection (sepsis) is likely, unless other causes are known. (NOTE)         ICU PCT Algorithm               Non ICU PCT Algorithm    ----------------------------     ------------------------------         PCT < 0.25 ng/mL                 PCT < 0.1 ng/mL     Stopping of antibiotics            Stopping of antibiotics       strongly encouraged.               strongly encouraged.    ----------------------------     ------------------------------       PCT level decrease by               PCT < 0.25 ng/mL       >= 80% from peak PCT       OR PCT 0.25 - 0.5 ng/mL          Stopping of antibiotics                                             encouraged.     Stopping of antibiotics           encouraged.    ----------------------------     ------------------------------       PCT level decrease by              PCT >= 0.25 ng/mL       < 80% from peak PCT        AND PCT >= 0.5 ng/mL            Continuing antibiotics                                               encouraged.       Continuing antibiotics            encouraged.    ----------------------------     ------------------------------     PCT level increase compared          PCT > 0.5 ng/mL         with peak PCT AND          PCT >= 0.5 ng/mL             Escalation of antibiotics                                          strongly encouraged.      Escalation of antibiotics        strongly encouraged.   Basic metabolic  panel     Status: Abnormal   Collection Time: 08/23/16  6:03 AM  Result Value Ref Range   Sodium 118 (LL) 135 - 145 mmol/L    Comment: CRITICAL RESULT CALLED TO, READ BACK BY AND VERIFIED WITH: B WOODSON,RN [id number] 0649 WILDERK    Potassium 2.2 (LL) 3.5 - 5.1 mmol/L    Comment: CRITICAL RESULT CALLED TO, READ BACK BY AND VERIFIED WITH: B WOODSON,RN [id number] WILDRK 0649    Chloride 83 (L) 101 - 111 mmol/L   CO2 22 22 - 32 mmol/L   Glucose, Bld 190 (H) 65 - 99 mg/dL   BUN 53 (H) 6 -  20 mg/dL   Creatinine, Ser 5.58 (H) 0.61 - 1.24 mg/dL   Calcium 7.3 (L) 8.9 - 10.3 mg/dL   GFR calc non Af Amer 23 (L) >60 mL/min   GFR calc Af Amer 26 (L) >60 mL/min    Comment: (NOTE) The eGFR has been calculated using the CKD EPI equation. This calculation has not been validated in all clinical situations. eGFR's persistently <60 mL/min signify possible Chronic Kidney Disease.    Anion gap 13 5 - 15  Basic metabolic panel     Status: Abnormal   Collection Time: 08/23/16  6:03 AM  Result Value Ref Range   Sodium 117 (LL) 135 - 145 mmol/L    Comment: CRITICAL RESULT CALLED TO, READ BACK BY AND VERIFIED WITH: B WOODSON,RN 0649 WILDERK    Potassium 2.2 (LL) 3.5 - 5.1 mmol/L    Comment: CRITICAL RESULT CALLED TO, READ BACK BY AND VERIFIED WITH: B WOODSON,RN [id number] 0649 WILDERK    Chloride 83 (L) 101 - 111 mmol/L   CO2 21 (L) 22 - 32 mmol/L   Glucose, Bld 188 (H) 65 - 99 mg/dL   BUN 52 (H) 6 - 20 mg/dL   Creatinine, Ser 1.47 (H) 0.61 - 1.24 mg/dL   Calcium 7.4 (L) 8.9 - 10.3 mg/dL   GFR calc non Af Amer 23 (L) >60 mL/min   GFR calc Af Amer 26 (L) >60 mL/min    Comment: (NOTE) The eGFR has been calculated using the CKD EPI equation. This calculation has not been validated in all clinical situations. eGFR's persistently <60 mL/min signify possible Chronic Kidney Disease.    Anion gap 13 5 - 15  Hemoglobin and hematocrit, blood     Status: Abnormal   Collection Time: 08/23/16  6:03 AM  Result Value Ref Range   Hemoglobin 9.7 (L) 13.0 - 17.0 g/dL    Comment: REPEATED TO VERIFY CORRECTED FOR COLD AGGLUTININS    HCT 26.2 (L) 39.0 - 52.0 %  APTT     Status: Abnormal   Collection Time: 08/23/16  6:03 AM  Result Value Ref Range   aPTT 42 (H) 24 - 36 seconds    Comment:        IF BASELINE aPTT IS ELEVATED, SUGGEST PATIENT RISK ASSESSMENT BE USED TO DETERMINE APPROPRIATE ANTICOAGULANT THERAPY.   Protime-INR     Status: Abnormal   Collection Time: 08/23/16  6:03 AM  Result  Value Ref Range   Prothrombin Time 21.4 (H) 11.4 - 15.2 seconds   INR 1.82   Glucose, capillary     Status: Abnormal   Collection Time: 08/23/16  7:23 AM  Result Value Ref Range   Glucose-Capillary 204 (H) 65 - 99 mg/dL   Comment 1 Capillary Specimen   Sodium, urine, random     Status: None   Collection Time: 08/23/16  9:24 AM  Result Value Ref Range   Sodium, Ur <10 mmol/L    Comment: REPEATED TO VERIFY  Osmolality, urine     Status: None   Collection Time: 08/23/16  9:45 AM  Result Value Ref Range   Osmolality, Ur 304 300 - 900 mOsm/kg   Ct Head Wo Contrast  Result Date: 08/22/2016 CLINICAL DATA:  Multiple falls at home. Dizziness with standing. Diabetes. EXAM: CT HEAD WITHOUT CONTRAST TECHNIQUE: Contiguous axial images were obtained from the base of the skull through the vertex without intravenous contrast. COMPARISON:  None. FINDINGS: Brain: Mild cerebral and cerebellar volume loss for age. No mass lesion, hemorrhage, hydrocephalus, acute infarct, intra-axial, or extra-axial fluid collection. No mass lesion, hemorrhage, hydrocephalus, acute infarct, intra-axial, or extra-axial fluid collection. Vascular: No hyperdense vessel or unexpected calcification. Skull: No significant soft tissue swelling.  No skull fracture. Sinuses/Orbits: Normal imaged portions of the orbits and globes. Hypoplastic right frontal sinus. Other paranasal sinuses and mastoid air cells clear. Other: None. IMPRESSION: No acute intracranial abnormality. Electronically Signed   By: Abigail Miyamoto M.D.   On: 08/22/2016 17:25   US Abdomen Complete  Result Date: 08/22/2016 CLINICAL DATA:  Acute onset of jaundice. Assess for cirrhosis. Initial encounter. EXAM: ABDOMEN ULTRASOUND COMPLETE COMPARISON:  Right upper quadrant ultrasound performed 07/27/2016 FINDINGS: Gallbladder: Gallbladder wall thickening is noted. This is nonspecific in the presence of ascites. Underlying sludge is noted within the gallbladder. No definite  stones are seen. No ultrasonographic Murphy's sign is elicited. Common bile duct: Diameter: 0.5 cm, within normal limits in caliber. Liver: A 1.4 cm cyst is noted at the left hepatic lobe. The nodular contour of the liver is compatible with hepatic cirrhosis. Small volume ascites is seen tracking about the liver. IVC: Not well characterized. Pancreas: Not well seen. Spleen: Size and appearance within normal limits. Right Kidney: Length: 12.8 cm. Echogenicity within normal limits. No mass or hydronephrosis visualized. Left Kidney: Length: 11.6 cm. Echogenicity within normal limits. No mass or hydronephrosis visualized. Abdominal aorta: Not characterized due to the patient's habitus. Other findings: None. IMPRESSION: 1. Small volume ascites noted about the liver. Nodular contour of the liver is compatible with hepatic cirrhosis. 2. 1.4 cm cyst at the left hepatic lobe. 3. Gallbladder wall thickening is nonspecific in the presence of ascites. Underlying gallbladder sludge noted. No definite stones seen. Electronically Signed   By: Garald Balding M.D.   On: 08/22/2016 22:33   Dg Chest Port 1 View  Result Date: 08/22/2016 CLINICAL DATA:  Hepatitis. EXAM: PORTABLE CHEST 1 VIEW COMPARISON:  Radiograph of December 28, 2013. FINDINGS: The heart size and mediastinal contours are within normal limits. Both lungs are clear. No pneumothorax or pleural effusion is noted. Old right rib fracture is noted. IMPRESSION: No acute cardiopulmonary abnormality seen. Electronically Signed   By: Marijo Conception, M.D.   On: 08/22/2016 21:22    ROS: all other systems reviewed and are negative except as per HPI  Blood pressure (!) 108/59, pulse 87, temperature 98.5 F (36.9 C), temperature source Oral, resp. rate (!) 26, height '6\' 2"'$  (1.88 m), weight 83.9 kg (185 lb), SpO2 97 %. Physical Exam  GEN ill appearing, sitting in bed, markedly jaundiced. HEENT icteric sclerae, eomi, perrl NECK no jvd PULM clear bilaterally CV  tachycardic, soft systolic murmur ABD distended, abd wall edema, some diffuse tenderness EXT 1+ LE edema NEURO + asterixis SKIN + spider telangectasias over chest MSK multiple ecchymoses  Assessment/Plan  1.  Acute kidney injury:  Occurring in the setting of acute alcoholic hepatitis superimposed on decompensated cirrhosis.  Urine sodium is < 10.  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  Any alteration in the delicate homeostasis of someone with ESLD can tip this balance towards HRS, and even hemodynamic insults that would otherwise result in a transient hypoperfusion injury in patients without liver disease can in fact precipitate HRS in those individuals with liver disease.  He is making some urine which is encouraging.  He is not a dialysis candidate which I have discussed with his mother.  She herself asked about hospice.  I think this is a reasonable thing to have in the back of our minds; however, I will attempt to mitigate the effects of HRS with the following regimen:  - continue octreotide gtt - will start midodrine 5 mg TID - albumin challenge 25 g IV TID x 48 hours  2.  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  Has some HE, on lactulose too.  Being empirically treated with CTX for SBP as well.  Not a transplant candidate due to heavy ongoing EtOH abuse.  3.  Possible GIB: on PPI and octreotide gtt  4.  Hypokalemia and hypomagnesemia: being repleted; while repleting these electrolytes don't attempt to correct hyponatremia since repleting those may cause some water diuresis.  5.  EtOH abuse: on CIWA protocol, banana bag.  6.  Hyponatremia: poor prognostic indicator in liver disease; + hypervolemia   Madelon Lips, MD [facility] pgr [phone] 08/23/2016, 11:15 AM

## 2016-08-23 NOTE — Progress Notes (Addendum)
Inpatient Diabetes Program Recommendations  AACE/ADA: New Consensus Statement on Inpatient Glycemic Control (2015)  Target Ranges:  Prepandial:   less than 140 mg/dL      Peak postprandial:   less than 180 mg/dL (1-2 hours)      Critically ill patients:  140 - 180 mg/dL   Results for Sharon MtWATTS, Akira E (MRN [id number]) as of 08/23/2016 11:01  Ref. Range 08/22/2016 20:31 08/22/2016 23:09 08/23/2016 04:21 08/23/2016 07:23  Glucose-Capillary Latest Ref Range: 65 - 99 mg/dL 829292 (H) 562220 (H) 130178 (H) 204 (H)    Admit with: Jaundice/ Hyponatremia/ Hypokalemia/ Slurred Speech  History: DM, ETOH Abuse   Home DM Meds: Metformin 500 mg daily  Current Insulin Orders: Novolog Moderate Correction Scale/ SSI (0-15 units) Q4 hours      MD- Note patient allowed solid PO diet this AM.  Getting Solumedrol 60 mg BID.  Having glucose elevations.  Please consider the following in-hospital insulin adjustments:  1. Change Novolog SSI to TID AC + HS (currently ordered Q4 hours)  2. Consider starting low dose basal insulin: Levemir 8 units daily  3. Check Current hemoglobin A1c level  4. Consider stopping Metformin for home use since pt has History of ETOH Abuse     --Will follow patient during hospitalization--  Ambrose FinlandJeannine Johnston Woodford Strege RN, MSN, CDE Diabetes Coordinator Inpatient Glycemic Control Team Team Pager: [phone] (8a-5p)

## 2016-08-23 NOTE — Progress Notes (Signed)
PULMONARY / CRITICAL CARE MEDICINE   Name: Tyler Clarke MRN: [id number] DOB: [birth year]    ADMISSION DATE:  08/22/2016 CONSULTATION DATE:  08/22/2016  REFERRING MD:  Dr. Fayrene Fearing  CHIEF COMPLAINT:  Jaundice   HISTORY OF PRESENT ILLNESS:   41 year old male with PMH of current smoker, heavy alcohol abuse, and DM who was sent to the ER today by his primary MD for abnormal labs and slurred speech that started on Wednesday.    Patient reports vaguely not to be feeling well for the last several weeks and reports a yellowish tint to his skin that started several days ago.  He additionally admits to recent falls at home.  Denies any abdominal pain, cough, shortness of breath, fever, chills, nausea, vomiting, diarrhea, bloody stools, weight gain, swelling, or dizziness.  He reports he currently smokes half to one pack of cigarettes a day, drinks 24 beers daily (last drink yesterday), and denies any recreational drug use.  Denies tylenol use.  Reports his diabetes was previously diet controlled.    In the ED, he was noted to have significant jaundice and hypotension of 80/51, heart rate 91,afebrile 97.3, and 96% on room air. He has been alert and oriented.  Labs noted for Na 114, K < 2, Cl 20, glucose 304, BUN 53, sCr 3.15 (previously 0.4 on 07/27/16), AG 21, albumin 1.9, AST 123, t. Bili 36.6,  WBC 39.2, Hgb 11.3, Hct 30.8, plts 147, INR 1.61, PT 19.3, other labs pending.  Head CT for recurrent falls was negative. EKG noted for prolonged QTc 0.540.  Stool was guaiac positive.  He was treated with 1L NS, 1 L LR, started on KCL and mag replacement, protonix bolus and drip started, and placed on octreotide.  Systolic blood pressure remains in the 90's.  PCCM to admit to ICU for hepatic and renal dysfunction.    SUBJECTIVE:  Per RN, increasing confusion overnight Patient reports pain in his feet and back, dry cough.  Questionably seeing "random objects" in room, denies voices.  His reports of his last drink  varies, now saying last drink was 6/16. Hemodynamically stable overnight Low urine output   VITAL SIGNS: BP (!) 106/58   Pulse 88   Temp 97.8 F (36.6 C) (Oral)   Resp (!) 22   Ht 6\' 2"  (1.88 m)   Wt 185 lb (83.9 kg)   SpO2 95%   BMI 23.75 kg/m   HEMODYNAMICS:    VENTILATOR SETTINGS:    INTAKE / OUTPUT: I/O last 3 completed shifts: In: 2005 [I.V.:1005; IV Piggyback:1000] Out: 500 [Urine:500]  PHYSICAL EXAMINATION: General:  Ill appearing adult male lying in bed in NAD HEENT: [place]/AT, MM pink/ dry, PERRL, sclerae icterus  Neuro: Awake, oriented to self and place, intermittent confusion, MAE, slurred speech improved, mild asterixis CV: s1s2 rrr, no m/r/g PULM: even/non-labored, 96% on RA, lungs bilaterally clear with diffuse faint expiratory wheeze GI: Distended, non-tender, bsx4 active  Extremities: warm/dry, no edema  Skin: deep jaundice, few telangiectasis to upper chest, bruising to lower legs  LABS:  BMET  Recent Labs Lab 08/22/16 2138 08/22/16 2326 08/23/16 0603  NA 114* 116* 117*  118*  K 2.1* 2.3* 2.2*  2.2*  CL 79* 80* 83*  83*  CO2 18* 19* 21*  22  BUN 54* 55* 52*  53*  CREATININE 3.18* 3.24* 3.20*  3.23*  GLUCOSE 252* 202* 188*  190*    Electrolytes  Recent Labs Lab 08/22/16 1749  08/22/16 2138 08/22/16 2326  08/23/16 0603  CALCIUM  --   < > 7.8* 7.7* 7.4*  7.3*  MG 2.3  --   --   --   --   < > = values in this interval not displayed.  CBC  Recent Labs Lab 08/22/16 1558 08/22/16 1946 08/22/16 2138 08/23/16 0603  WBC 39.2*  --   --   --   HGB 11.3* 10.9* 10.8* 9.7*  HCT 30.8* 32.0* 29.8* 26.2*  PLT 147*  --   --   --     Coag's  Recent Labs Lab 08/22/16 1749 08/23/16 0603  APTT  --  42*  INR 1.61 1.82    Sepsis Markers  Recent Labs Lab 08/22/16 1931 08/22/16 2136 08/23/16 0603  LATICACIDVEN 3.4* 4.2*  --   PROCALCITON 5.07  --  5.14    ABG No results for input(s): PHART, PCO2ART, PO2ART in the last 168  hours.  Liver Enzymes  Recent Labs Lab 08/22/16 1558  AST 123*  ALT 50  ALKPHOS 209*  BILITOT 36.6*  ALBUMIN 1.9*    Cardiac Enzymes No results for input(s): TROPONINI, PROBNP in the last 168 hours.  Glucose  Recent Labs Lab 08/22/16 2031 08/22/16 2309 08/23/16 0421 08/23/16 0723  GLUCAP 292* 220* 178* 204*    Imaging Ct Head Wo Contrast  Result Date: 08/22/2016 CLINICAL DATA:  Multiple falls at home. Dizziness with standing. Diabetes. EXAM: CT HEAD WITHOUT CONTRAST TECHNIQUE: Contiguous axial images were obtained from the base of the skull through the vertex without intravenous contrast. COMPARISON:  None. FINDINGS: Brain: Mild cerebral and cerebellar volume loss for age. No mass lesion, hemorrhage, hydrocephalus, acute infarct, intra-axial, or extra-axial fluid collection. No mass lesion, hemorrhage, hydrocephalus, acute infarct, intra-axial, or extra-axial fluid collection. Vascular: No hyperdense vessel or unexpected calcification. Skull: No significant soft tissue swelling.  No skull fracture. Sinuses/Orbits: Normal imaged portions of the orbits and globes. Hypoplastic right frontal sinus. Other paranasal sinuses and mastoid air cells clear. Other: None. IMPRESSION: No acute intracranial abnormality. Electronically Signed   By: Jeronimo Greaves M.D.   On: 08/22/2016 17:25   US Abdomen Complete  Result Date: 08/22/2016 CLINICAL DATA:  Acute onset of jaundice. Assess for cirrhosis. Initial encounter. EXAM: ABDOMEN ULTRASOUND COMPLETE COMPARISON:  Right upper quadrant ultrasound performed 07/27/2016 FINDINGS: Gallbladder: Gallbladder wall thickening is noted. This is nonspecific in the presence of ascites. Underlying sludge is noted within the gallbladder. No definite stones are seen. No ultrasonographic Murphy's sign is elicited. Common bile duct: Diameter: 0.5 cm, within normal limits in caliber. Liver: A 1.4 cm cyst is noted at the left hepatic lobe. The nodular contour of the  liver is compatible with hepatic cirrhosis. Small volume ascites is seen tracking about the liver. IVC: Not well characterized. Pancreas: Not well seen. Spleen: Size and appearance within normal limits. Right Kidney: Length: 12.8 cm. Echogenicity within normal limits. No mass or hydronephrosis visualized. Left Kidney: Length: 11.6 cm. Echogenicity within normal limits. No mass or hydronephrosis visualized. Abdominal aorta: Not characterized due to the patient's habitus. Other findings: None. IMPRESSION: 1. Small volume ascites noted about the liver. Nodular contour of the liver is compatible with hepatic cirrhosis. 2. 1.4 cm cyst at the left hepatic lobe. 3. Gallbladder wall thickening is nonspecific in the presence of ascites. Underlying gallbladder sludge noted. No definite stones seen. Electronically Signed   By: Roanna Raider M.D.   On: 08/22/2016 22:33   Dg Chest Port 1 View  Result Date: 08/22/2016 CLINICAL DATA:  Hepatitis. EXAM: PORTABLE CHEST 1 VIEW COMPARISON:  Radiograph of December 28, 2013. FINDINGS: The heart size and mediastinal contours are within normal limits. Both lungs are clear. No pneumothorax or pleural effusion is noted. Old right rib fracture is noted. IMPRESSION: No acute cardiopulmonary abnormality seen. Electronically Signed   By: Lupita Raider, M.D.   On: 2016/08/31 21:22    STUDIES:  09-01-22 Heat CT >> normal 2022/09/01 [place] abd >> small volume ascites, nodular contour of liver c/w hepatic cirrhosis; 1.4 cm cyst at the left hepatic lobe; gallbladder wall thickening is nonspecific in the presence of ascites, underlying gallbladder sludge, no definite stones 6/20 Renal US >>  CULTURES: 09/01/22 BC >>  ANTIBIOTICS: 2022/09/01 ceftriaxone >>  SIGNIFICANT EVENTS: 09/01/2022 Admit   LINES/TUBES: PIV   DISCUSSION: 41 year male w/ PMH of heavy alcohol abuse (24 beers daily) and DM who was sent to ER for abnormal electrolyte abnormalities, jaundice, slurred speech, and multiple recent falls.     ASSESSMENT / PLAN:  PULMONARY A: Tobacco abuse - CXR normal P:   O2 prn for sats > 94% Pulmonary hygiene with IS  Duonebs q4 hr prn  Smoking cessation   CARDIOVASCULAR A:  Shock - ddx etiology sepsis vs less likely hypovolemic/ blood loss vs ?etoh cardiomyopathy Prolonged QTc - currently hemodynamically stable 6/20 P:  Tele monitoring   Continue trending lactates as not cleared  with hepatic function Goal map > 65 Avoid meds that prolong QTc  Consider TTE  RENAL A:   AKI - oliguric - likely hepatorenal syndrome  Hyponatremia- beer potomania  Hypokalemia Hypomagnesium Hypoalbuminemia AGMA - lactate acidosis +/- renal failure P:   NS at 75 ml/lhr  S/p KCL replacement x 4 runs 6/20 Continue BMP q 2 hrs to avoid rapid correction of Na Check urine osmolarity and urine sodium  Will consult Nephrology  Checking Renal US Trend urinary output/ strict I/O's Replace electrolytes as indicated Avoid nephrotoxic agents, ensure adequate renal perfusion  GASTROINTESTINAL A:   Acute alcoholic hepatitis - MELD score 36  Severe protein calorie malnutrition Possible cirrhosis  R/o GI bleed - low suspicion given no vomiting or melena  - ammonia 100, HCG 2, ETOH neg - Abd [place] with mild volume ascites, nodular liver P:   Appreciate GI assistance Dietary consult Continue diet PO for now to, will need to monitor mentation Cautious with OG/NG tube for possible varices Pending AFP, acute hepatitis panel Consider IR consult for diagnostic paracentesis send fluid for cell count, culture, and albumin Continue ceftriaxone for empiric SBP coverage, dose adjusted 6/20 to 2gm Continue solumedrol 40 mg daily   Continue albumin 5% q 6hr  Continue lactulose Trend ammonia levels  Continue protonix drip to 40mg  IV BID  PMT consult   HEMATOLOGIC A:   Anemia - mild  Thrombocytopenia- P:  CBC now  Trend CBC SCDs  INFECTIOUS A:   Leukocytosis-  ddx include acute hepatitis vs  SBP P:   Trend WBC Monitor fever curve  Trend PCT  Follow cultures Continue ceftriaxone, dose adjusted 6/20   ENDOCRINE A:   DM uncontrolled - cortisol 39.6 P:   SSI increase to moderate CBG q 4  NEUROLOGIC A:  Alcohol abuse - heavy, 24 beers daily,  Falls Slurred speech w/ no focal deficits - improved Hepatic encephalopathy Hx ? depression - head CT neg P:   SDU CIWA protocol -> d/c versed, start ativan  Monitor neuro status  Continue daily MVI/ folate/ thiamine Resume home Tyler Clarke  Lactulose as above CSW consulting   FAMILY  - Updates: Patient's mother, Tyler Clarke [phone]) at bedside this morning.  States patient is married, however his wife left him back in February with their 40 year old daughter and has not heard from them since.  His drinking became heavier at this point.  His mother states his father has alcohol abuse issues as well.  Dr. Signe ColtUpton was at bedside and spoke to her as well.  His overall prognosis is very grim given his concurrent hepatic and renal failure.  Mother has realistic views and agrees to continue medical care but agrees to palliative care consult and full DNR/ DNI if he declines.    Will transfer to SDU with [facility] to assume primary care 6/21.  PCCM will sign off and be available as needed.   - Inter-disciplinary family meet or Palliative Care meeting due by:  6/26  Posey BoyerBrooke Simpson, AGACNP-BC [facility] Pgr: [phone] or if no answer [phone] 08/23/2016, 9:39 AM  Attending Note:  41 year old male alcoholic presenting with with liver and renal failure.  Patient is actively drinking so not a liver transplant candidate.  Essentially aneuric at this point.  On exam, decreased BS at the bases with ascites.  I reviewed CXR myself, no active disease noted.  Withdrawal and respiratory failure are a serious concern here. Spoke with renal service, patient is not a dialysis candidate.  Will attempt diureses.  Spoke with mother  since patient is encephalopathic, after discussion, decision was made to make patient a full DNR.  Will start lactulose.  [facility] with P[facility]M off 6/21.  The patient is critically ill with multiple organ systems failure and requires high complexity decision making for assessment and support, frequent evaluation and titration of therapies, application of advanced monitoring technologies and extensive interpretation of multiple databases.   Critical Care Time devoted to patient care services described in this note is  35  Minutes. This time reflects time of care of this signee Dr Koren BoundWesam Yacoub. This critical care time does not reflect procedure time, or teaching time or supervisory time of PA/NP/Med student/Med Resident etc but could involve care discussion time.  Alyson ReedyWesam G. Yacoub, M.D. [facility]. Pager: [phone]. After hours pager: [phone].

## 2016-08-23 NOTE — Progress Notes (Signed)
Daily Rounding Note  08/23/2016, 9:58 AM  LOS: 1 day   SUBJECTIVE:   Chief complaint: more agitated this AM.  Ativan PRN.  Stools brown.      Urine output partial day yesterday: 500 ml, 200 ml so far today.    OBJECTIVE:         Vital signs in last 24 hours:    Temp:  [97.3 F (36.3 C)-97.8 F (36.6 C)] 97.8 F (36.6 C) (06/20 0725) Pulse Rate:  [81-96] 87 (06/20 0900) Resp:  [15-28] 17 (06/20 0900) BP: (72-130)/(47-84) 105/70 (06/20 0900) SpO2:  [94 %-100 %] 97 % (06/20 0900) Weight:  [83.9 kg (185 lb)] 83.9 kg (185 lb) (06/19 1557) Last BM Date: 08/21/16 Filed Weights   08/22/16 1557  Weight: 83.9 kg (185 lb)   General: deeply jaundiced.   Heart: RRR Chest: wheezing bil.  No labored breathing Abdomen: protuberant, tense, NT.  BS hypoactive.   Extremities: no edema Neuro/Psych:  Awakens to his name and moderate nudging.  Follows commands.  Oriented to self only.  UE tremor, may be asterixis as well but hands in restraint mittens so hard to assess liver flap.    Intake/Output from previous day: 06/19 0701 - 06/20 0700 In: 2005 [I.V.:1005; IV Piggyback:1000] Out: 500 [Urine:500]  Intake/Output this shift: Total I/O In: 1250 [I.V.:150; IV Piggyback:1100] Out: -   Lab Results:  Recent Labs  08/22/16 1558 08/22/16 1946 08/22/16 2138 08/23/16 0603  WBC 39.2*  --   --   --   HGB 11.3* 10.9* 10.8* 9.7*  HCT 30.8* 32.0* 29.8* 26.2*  PLT 147*  --   --   --    BMET  Recent Labs  08/22/16 2138 08/22/16 2326 08/23/16 0603  NA 114* 116* 117*  118*  K 2.1* 2.3* 2.2*  2.2*  CL 79* 80* 83*  83*  CO2 18* 19* 21*  22  GLUCOSE 252* 202* 188*  190*  BUN 54* 55* 52*  53*  CREATININE 3.18* 3.24* 3.20*  3.23*  CALCIUM 7.8* 7.7* 7.4*  7.3*   LFT  Recent Labs  08/22/16 1558  PROT 5.3*  ALBUMIN 1.9*  AST 123*  ALT 50  ALKPHOS 209*  BILITOT 36.6*  BILIDIR 23.2*  IBILI 13.4*    PT/INR  Recent Labs  08/22/16 1749 08/23/16 0603  LABPROT 19.3* 21.4*  INR 1.61 1.82   Hepatitis Panel No results for input(s): HEPBSAG, HCVAB, HEPAIGM, HEPBIGM in the last 72 hours.  Studies/Results: Ct Head Wo Contrast  Result Date: 08/22/2016 CLINICAL DATA:  Multiple falls at home. Dizziness with standing. Diabetes. EXAM: CT HEAD WITHOUT CONTRAST TECHNIQUE: Contiguous axial images were obtained from the base of the skull through the vertex without intravenous contrast. COMPARISON:  None. FINDINGS: Brain: Mild cerebral and cerebellar volume loss for age. No mass lesion, hemorrhage, hydrocephalus, acute infarct, intra-axial, or extra-axial fluid collection. No mass lesion, hemorrhage, hydrocephalus, acute infarct, intra-axial, or extra-axial fluid collection. Vascular: No hyperdense vessel or unexpected calcification. Skull: No significant soft tissue swelling.  No skull fracture. Sinuses/Orbits: Normal imaged portions of the orbits and globes. Hypoplastic right frontal sinus. Other paranasal sinuses and mastoid air cells clear. Other: None. IMPRESSION: No acute intracranial abnormality. Electronically Signed   By: Jeronimo Greaves M.D.   On: 08/22/2016 17:25   US Abdomen Complete  Result Date: 08/22/2016 CLINICAL DATA:  Acute onset of jaundice. Assess for cirrhosis. Initial encounter. EXAM: ABDOMEN ULTRASOUND COMPLETE COMPARISON:  Right  upper quadrant ultrasound performed 07/27/2016 FINDINGS: Gallbladder: Gallbladder wall thickening is noted. This is nonspecific in the presence of ascites. Underlying sludge is noted within the gallbladder. No definite stones are seen. No ultrasonographic Murphy's sign is elicited. Common bile duct: Diameter: 0.5 cm, within normal limits in caliber. Liver: A 1.4 cm cyst is noted at the left hepatic lobe. The nodular contour of the liver is compatible with hepatic cirrhosis. Small volume ascites is seen tracking about the liver. IVC: Not well characterized.  Pancreas: Not well seen. Spleen: Size and appearance within normal limits. Right Kidney: Length: 12.8 cm. Echogenicity within normal limits. No mass or hydronephrosis visualized. Left Kidney: Length: 11.6 cm. Echogenicity within normal limits. No mass or hydronephrosis visualized. Abdominal aorta: Not characterized due to the patient's habitus. Other findings: None. IMPRESSION: 1. Small volume ascites noted about the liver. Nodular contour of the liver is compatible with hepatic cirrhosis. 2. 1.4 cm cyst at the left hepatic lobe. 3. Gallbladder wall thickening is nonspecific in the presence of ascites. Underlying gallbladder sludge noted. No definite stones seen. Electronically Signed   By: Roanna RaiderJeffery  Chang M.D.   On: 08/22/2016 22:33   Dg Chest Port 1 View  Result Date: 08/22/2016 CLINICAL DATA:  Hepatitis. EXAM: PORTABLE CHEST 1 VIEW COMPARISON:  Radiograph of December 28, 2013. FINDINGS: The heart size and mediastinal contours are within normal limits. Both lungs are clear. No pneumothorax or pleural effusion is noted. Old right rib fracture is noted. IMPRESSION: No acute cardiopulmonary abnormality seen. Electronically Signed   By: Lupita RaiderJames  Green Jr, M.D.   On: 08/22/2016 21:22   Scheduled Meds: . folic acid  1 mg Intravenous Daily  . insulin aspart  0-15 Units Subcutaneous Q4H  . lactulose  30 g Oral TID  . methylPREDNISolone (SOLU-MEDROL) injection  60 mg Intravenous Q12H  . multivitamin with minerals  1 tablet Oral Daily  . pantoprazole (PROTONIX) IV  40 mg Intravenous Q12H  . thiamine  100 mg Intravenous Daily   Continuous Infusions: . sodium chloride 75 mL/hr (08/22/16 2300)  . albumin human 12.5 g (08/23/16 0846)  . cefTRIAXone (ROCEPHIN)  IV    . potassium chloride 10 mEq (08/23/16 0949)   PRN Meds:.ipratropium-albuterol   ASSESMENT:   *  ETOH hepatitis.  Cirrhosis.  Discriminant fx 52.  IV Solumedrol initiated until he can take po Prednisolone 40 mg daily.    *  AKI.  Hepatorenal  syndrome.  IV albumin q 8 hours, Midodrine, scheduled (not drip) octreotide in place.   *  Ascites.  At some point should tap to assess for SBP.  Rocephin in place.   *  Hyponatremia.   *  Hypokalemia.    *  Anemia, macrocytic.  FOBT positive. Dr Leone PayorGessner stopped Octreotide.  Remains on BID IV Protonix.  Rocephin in place   *  Thrombocytopenia.    *  Elevated ammonia level.  Lactulose in place.   *  Severe PCM.    *  Chronic alcoholism, dependency.    *  DM 2.  Previously diet controlled. RD consult suggested.    PLAN   *  CMET, coags in AM, supportive care.    Jennye MoccasinSarah Gribbin  08/23/2016, 9:58 AM Pager: [phone]     Attending physician's note   I have taken an interval history, reviewed the chart and examined the patient. I agree with the Advanced Practitioner's note, impression and recommendations. Alcoholic hepatitis, alcoholism, AKI - suspected HRS, hyponatremia, hypokalemia, possible HE. Watch for alcohol  withdrawal, continue corticosteroids for alcoholic hepatitis, correct electrolytes. Follow CMP, CBC, PT/INR at least daily. Prognosis is poor with severe alcoholic hepatitis, rising INR and HRS.   Claudette Head, MD Clementeen Graham [phone] Mon-Fri 8a-5p [phone] after 5p, weekends, holidays

## 2016-08-23 NOTE — Progress Notes (Signed)
eLink Physician-Brief Progress Note Patient Name: [name] E Vigil DOB: 12-21-1975 MRN: [id number]   Date of Service  08/23/2016  HPI/Events of Note  K+ = 2.2 and Creatinine = 3.2.  eICU Interventions  Will order: 1. Replace K+. 2. BMP at 12 noon.      Intervention Category Major Interventions: Electrolyte abnormality - evaluation and management  Deundra Furber Eugene 08/23/2016, 7:00 AM

## 2016-08-23 NOTE — Progress Notes (Signed)
eLink Physician-Brief Progress Note Patient Name: Tyler Clarke DOB: [birth year] MRN: [id number]   Date of Service  08/23/2016  HPI/Events of Note  Agitation.  eICU Interventions  Will start ICU CIWA protocol.     Intervention Category Minor Interventions: Agitation / anxiety - evaluation and management  Shayanna Thatch Dennard Nipugene 08/23/2016, 6:31 AM

## 2016-08-23 NOTE — Progress Notes (Signed)
Initial Nutrition Assessment  DOCUMENTATION CODES:   Not applicable  INTERVENTION:    Glucerna Shake po TID, each supplement provides 220 kcal and 10 grams of protein  Continue MVI daily  NUTRITION DIAGNOSIS:   Inadequate oral intake related to poor appetite as evidenced by meal completion < 50%.  GOAL:   Patient will meet greater than or equal to 90% of their needs  MONITOR:   PO intake, Supplement acceptance, Labs, I & O's  REASON FOR ASSESSMENT:   Consult Assessment of nutrition requirement/status  ASSESSMENT:   41 yo male with hx of diet controlled DM, tobacco and heavy alcohol abuse who was admitted on 6/19 with abnormal electrolytes, jaundice, slurred speech, and multiple falls. Ultrasound of the abdomen showed nodular liver likely related to hepatic cirrhosis.  Discussed with RN today. Patient is somewhat confused. RN reports that patient ate one bite of eggs for breakfast today. He c/o poor appetite for the past 4-5 months, but denies weight loss.  Nutrition-Focused physical exam completed. Findings are mild fat depletion, no muscle depletion, and no edema.   Labs reviewed: sodium 117 (L), potassium 2.2 (L), magnesium 2.3 (WNL), albumin 1.9 (L) CBG's: 178-204 Medications reviewed and include folic acid, lactulose, MVI, thiamine, and KCl.  Low albumin is a poor indicator of nutrition status. This lab value is reflective of inflammatory process, acute stress response, and volume overload. Low levels are associated with liver dysfunction, and does not indicate malnutrition.  Given hx of alcohol abuse, suspect quality of patient's diet was poor PTA. Per dietary recall, patient usually consumes 2 meals per day (lunch and dinner), usually burgers, fries, fast food, etc.  Patient is at risk for refeeding syndrome given alcohol abuse with low potassium level.  Diet Order:  Diet Heart Room service appropriate? Yes; Fluid consistency: Thin  Skin:  Wound (see comment)  (stage I to buttocks)  Last BM:  6/18  Height:   Ht Readings from Last 1 Encounters:  08/22/16 6\' 2"  (1.88 m)    Weight:   Wt Readings from Last 1 Encounters:  08/22/16 185 lb (83.9 kg)    Ideal Body Weight:  86.4 kg  BMI:  Body mass index is 23.75 kg/m.  Estimated Nutritional Needs:   Kcal:  2300-2500  Protein:  100-120 gm  Fluid:  1.2 L (given low sodium)  EDUCATION NEEDS:   No education needs identified at this time  Joaquin CourtsKimberly Lomax Poehler, RD, LDN, CNSC Pager [phone] After Hours Pager [phone]

## 2016-08-24 DIAGNOSIS — G9340 Encephalopathy, unspecified: Secondary | ICD-10-CM

## 2016-08-24 LAB — COMPREHENSIVE METABOLIC PANEL
ALBUMIN: 2.6 g/dL — AB (ref 3.5–5.0)
ALT: 33 U/L (ref 17–63)
ANION GAP: 12 (ref 5–15)
AST: 77 U/L — ABNORMAL HIGH (ref 15–41)
Alkaline Phosphatase: 111 U/L (ref 38–126)
BILIRUBIN TOTAL: 39.9 mg/dL — AB (ref 0.3–1.2)
BUN: 61 mg/dL — ABNORMAL HIGH (ref 6–20)
CO2: 21 mmol/L — ABNORMAL LOW (ref 22–32)
Calcium: 7.2 mg/dL — ABNORMAL LOW (ref 8.9–10.3)
Chloride: 87 mmol/L — ABNORMAL LOW (ref 101–111)
Creatinine, Ser: 3.41 mg/dL — ABNORMAL HIGH (ref 0.61–1.24)
GFR calc non Af Amer: 21 mL/min — ABNORMAL LOW (ref >60)
GFR, EST AFRICAN AMERICAN: 25 mL/min — AB (ref >60)
GLUCOSE: 117 mg/dL — AB (ref 65–99)
POTASSIUM: 2.2 mmol/L — AB (ref 3.5–5.1)
SODIUM: 120 mmol/L — AB (ref 135–145)
TOTAL PROTEIN: 4.8 g/dL — AB (ref 6.5–8.1)

## 2016-08-24 LAB — GLUCOSE, CAPILLARY
GLUCOSE-CAPILLARY: 101 mg/dL — AB (ref 65–99)
GLUCOSE-CAPILLARY: 128 mg/dL — AB (ref 65–99)
GLUCOSE-CAPILLARY: 178 mg/dL — AB (ref 65–99)
Glucose-Capillary: 141 mg/dL — ABNORMAL HIGH (ref 65–99)
Glucose-Capillary: 174 mg/dL — ABNORMAL HIGH (ref 65–99)
Glucose-Capillary: 175 mg/dL — ABNORMAL HIGH (ref 65–99)

## 2016-08-24 LAB — BASIC METABOLIC PANEL
ANION GAP: 15 (ref 5–15)
BUN: 66 mg/dL — ABNORMAL HIGH (ref 6–20)
CALCIUM: 7 mg/dL — AB (ref 8.9–10.3)
CO2: 18 mmol/L — AB (ref 22–32)
Chloride: 91 mmol/L — ABNORMAL LOW (ref 101–111)
Creatinine, Ser: 3.01 mg/dL — ABNORMAL HIGH (ref 0.61–1.24)
GFR, EST AFRICAN AMERICAN: 28 mL/min — AB (ref >60)
GFR, EST NON AFRICAN AMERICAN: 25 mL/min — AB (ref >60)
Glucose, Bld: 170 mg/dL — ABNORMAL HIGH (ref 65–99)
Potassium: 2.8 mmol/L — ABNORMAL LOW (ref 3.5–5.1)
SODIUM: 124 mmol/L — AB (ref 135–145)

## 2016-08-24 LAB — HEPATITIS PANEL, ACUTE
HCV Ab: <0.1 s/co ratio (ref 0.0–0.9)
HEP A IGM: NEGATIVE
Hep B C IgM: NEGATIVE
Hepatitis B Surface Ag: NEGATIVE

## 2016-08-24 LAB — CBC
HEMATOCRIT: 26.6 % — AB (ref 39.0–52.0)
HEMOGLOBIN: 9.4 g/dL — AB (ref 13.0–17.0)
MCH: 37.5 pg — ABNORMAL HIGH (ref 26.0–34.0)
MCHC: 35.3 g/dL (ref 30.0–36.0)
MCV: 106 fL — ABNORMAL HIGH (ref 78.0–100.0)
Platelets: 118 10*3/uL — ABNORMAL LOW (ref 150–400)
RBC: 2.51 MIL/uL — AB (ref 4.22–5.81)
RDW: 12.9 % (ref 11.5–15.5)
WBC: 36.8 10*3/uL — ABNORMAL HIGH (ref 4.0–10.5)

## 2016-08-24 LAB — PROCALCITONIN: Procalcitonin: 3.37 ng/mL

## 2016-08-24 LAB — PROTIME-INR
INR: 1.54
Prothrombin Time: 18.6 seconds — ABNORMAL HIGH (ref 11.4–15.2)

## 2016-08-24 LAB — AFP TUMOR MARKER: AFP-Tumor Marker: 2 ng/mL (ref 0.0–8.3)

## 2016-08-24 MED ORDER — POTASSIUM CHLORIDE 10 MEQ/100ML IV SOLN
10.0000 meq | INTRAVENOUS | Status: AC
  Administered 2016-08-24 (×6): 10 meq via INTRAVENOUS
  Filled 2016-08-24 (×7): qty 100

## 2016-08-24 MED ORDER — LACTULOSE ENEMA
300.0000 mL | Freq: Three times a day (TID) | ORAL | Status: DC
  Administered 2016-08-24 – 2016-08-25 (×4): 300 mL via RECTAL
  Filled 2016-08-24 (×7): qty 300

## 2016-08-24 MED ORDER — POTASSIUM CHLORIDE IN NACL 40-0.9 MEQ/L-% IV SOLN
INTRAVENOUS | Status: AC
  Administered 2016-08-24: 75 mL/h via INTRAVENOUS
  Filled 2016-08-24: qty 1000

## 2016-08-24 MED ORDER — POTASSIUM CHLORIDE IN NACL 40-0.9 MEQ/L-% IV SOLN
INTRAVENOUS | Status: DC
  Filled 2016-08-24: qty 1000

## 2016-08-24 MED ORDER — POTASSIUM CHLORIDE CRYS ER 20 MEQ PO TBCR
20.0000 meq | EXTENDED_RELEASE_TABLET | Freq: Once | ORAL | Status: AC
  Administered 2016-08-24: 20 meq via ORAL
  Filled 2016-08-24: qty 1

## 2016-08-24 NOTE — Care Management Note (Signed)
Case Management Note  Patient Details  Name: [name] E Eisel MRN: [id number] Date of Birth: 08-Jul-1975  Subjective/Objective:      Pt admitted in liver failure              Action/Plan: Pt from home, current substance abuser and now going through withdraws.  Prognosis is poor with severe etoh hepatitis and HRS.    Expected Discharge Date:                  Expected Discharge Plan:     In-House Referral:  Clinical Social Work  Discharge planning Services  CM Consult  Post Acute Care Choice:    Choice offered to:     DME Arranged:    DME Agency:     HH Arranged:    HH Agency:     Status of Service:     If discussed at [employer] Meetings, dates discussed:    Additional Comments:  Cherylann ParrClaxton, Aprill Banko S, RN 08/24/2016, 2:17 PM

## 2016-08-24 NOTE — Progress Notes (Signed)
[facility] Progress Note    Assessment/ Plan:   1.  Acute kidney injury: Occurring in the setting of acute alcoholic hepatitis superimposed on decompensated cirrhosis.  Urine sodium is < 10.  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  He is not a dialysis candidate which I have discussed with his mother.  She herself asked about hospice.  I think this is a reasonable thing to have in the back of our minds.  There is no renal function improvement yet.  - octreotide Hutchins TID - will start midodrine 5 mg TID - albumin challenge 25 g IV TID x 48 hours  2.  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  Has some HE, on lactulose too.  Being empirically treated with CTX for SBP as well.  Not a transplant candidate due to heavy ongoing EtOH abuse.  3.  Possible GIB: Hgb stable.  IV PPID BID  4.  Hypokalemia and hypomagnesemia: being repleted; while repleting these electrolytes don't attempt to correct hyponatremia since repleting those may cause some water diuresis (and increase Na)  5.  EtOH abuse: on CIWA protocol, banana bag.  6.  Hyponatremia: poor prognostic indicator in liver disease; + hypervolemia, maybe low solute diet too  Subjective:    Still confused, has a sitter now.     Objective:   BP (!) 118/56 (BP Location: Right Arm)   Pulse 86   Temp (!) 96.7 F (35.9 C) (Axillary)   Resp 16   Ht 6\' 2"  (1.88 m)   Wt 83.9 kg (185 lb)   SpO2 97%   BMI 23.75 kg/m   Intake/Output Summary (Last 24 hours) at 08/24/16 0928 Last data filed at 08/24/16 0800  Gross per 24 hour  Intake             2695 ml  Output             1025 ml  Net             1670 ml   Weight change:   Physical Exam: GEN ill appearing, lying in bed, markedly jaundiced. HEENT icteric sclerae, eomi, perrl  NECK no jvd PULM clear bilaterally CV tachycardic, soft systolic murmur ABD  distended, abd wall edema, some diffuse tenderness EXT 1+ LE edema NEURO + asterixis, in mitts, picking at covers SKIN + spider telangectasias over chest MSK multiple ecchymoses  Imaging: Ct Head Wo Contrast  Result Date: 08/22/2016 CLINICAL DATA:  Multiple falls at home. Dizziness with standing. Diabetes. EXAM: CT HEAD WITHOUT CONTRAST TECHNIQUE: Contiguous axial images were obtained from the base of the skull through the vertex without intravenous contrast. COMPARISON:  None. FINDINGS: Brain: Mild cerebral and cerebellar volume loss for age. No mass lesion, hemorrhage, hydrocephalus, acute infarct, intra-axial, or extra-axial fluid collection. No mass lesion, hemorrhage, hydrocephalus, acute infarct, intra-axial, or extra-axial fluid collection. Vascular: No hyperdense vessel or unexpected calcification. Skull: No significant soft tissue swelling.  No skull fracture. Sinuses/Orbits: Normal imaged portions of the orbits and globes. Hypoplastic right frontal sinus. Other paranasal sinuses and mastoid air cells clear. Other: None. IMPRESSION: No acute intracranial abnormality. Electronically Signed   By: Jeronimo Greaves M.D.   On: 08/22/2016 17:25   US Abdomen Complete  Result Date: 08/22/2016 CLINICAL DATA:  Acute onset of jaundice. Assess for cirrhosis. Initial encounter. EXAM: ABDOMEN ULTRASOUND COMPLETE COMPARISON:  Right upper quadrant ultrasound performed 07/27/2016  FINDINGS: Gallbladder: Gallbladder wall thickening is noted. This is nonspecific in the presence of ascites. Underlying sludge is noted within the gallbladder. No definite stones are seen. No ultrasonographic Murphy's sign is elicited. Common bile duct: Diameter: 0.5 cm, within normal limits in caliber. Liver: A 1.4 cm cyst is noted at the left hepatic lobe. The nodular contour of the liver is compatible with hepatic cirrhosis. Small volume ascites is seen tracking about the liver. IVC: Not well characterized. Pancreas: Not well seen.  Spleen: Size and appearance within normal limits. Right Kidney: Length: 12.8 cm. Echogenicity within normal limits. No mass or hydronephrosis visualized. Left Kidney: Length: 11.6 cm. Echogenicity within normal limits. No mass or hydronephrosis visualized. Abdominal aorta: Not characterized due to the patient's habitus. Other findings: None. IMPRESSION: 1. Small volume ascites noted about the liver. Nodular contour of the liver is compatible with hepatic cirrhosis. 2. 1.4 cm cyst at the left hepatic lobe. 3. Gallbladder wall thickening is nonspecific in the presence of ascites. Underlying gallbladder sludge noted. No definite stones seen. Electronically Signed   By: Roanna Raider M.D.   On: 08/22/2016 22:33   Dg Chest Port 1 View  Result Date: 08/22/2016 CLINICAL DATA:  Hepatitis. EXAM: PORTABLE CHEST 1 VIEW COMPARISON:  Radiograph of December 28, 2013. FINDINGS: The heart size and mediastinal contours are within normal limits. Both lungs are clear. No pneumothorax or pleural effusion is noted. Old right rib fracture is noted. IMPRESSION: No acute cardiopulmonary abnormality seen. Electronically Signed   By: Lupita Raider, M.D.   On: 08/22/2016 21:22    Labs: BMET  Recent Labs Lab 08/22/16 1558 08/22/16 1931 08/22/16 1946 08/22/16 2138 08/22/16 2326 08/23/16 0603 08/24/16 0252  NA 114* 115* 115* 114* 116* 117*  118* 120*  K <2.0* 2.1* 2.2* 2.1* 2.3* 2.2*  2.2* 2.2*  CL 73* 79* 76* 79* 80* 83*  83* 87*  CO2 20* 21*  --  18* 19* 21*  22 21*  GLUCOSE 304* 269* 271* 252* 202* 188*  190* 117*  BUN 53* 53* 47* 54* 55* 52*  53* 61*  CREATININE 3.15* 3.13* 3.10* 3.18* 3.24* 3.20*  3.23* 3.41*  CALCIUM 8.7* 7.9*  --  7.8* 7.7* 7.4*  7.3* 7.2*   CBC  Recent Labs Lab 08/22/16 1558  08/22/16 2138 08/23/16 0603 08/23/16 1154 08/24/16 0252  WBC 39.2*  --   --   --  35.8* 36.8*  NEUTROABS  --   --   --   --  33.3*  --   HGB 11.3*  < > 10.8* 9.7* 9.7* 9.4*  HCT 30.8*  < > 29.8*  26.2* 26.9* 26.6*  MCV 102.7*  --   --   --  103.9* 106.0*  PLT 147*  --   --   --  102* 118*  < > = values in this interval not displayed.  Medications:    . feeding supplement (GLUCERNA SHAKE)  237 mL Oral TID BM  . folic acid  1 mg Intravenous Daily  . insulin aspart  0-15 Units Subcutaneous Q4H  . lactulose  300 mL Rectal Q8H  . methylPREDNISolone (SOLU-MEDROL) injection  60 mg Intravenous Q12H  . midodrine  5 mg Oral TID WC  . multivitamin with minerals  1 tablet Oral Daily  . octreotide  200 mcg Subcutaneous TID  . pantoprazole (PROTONIX) IV  40 mg Intravenous Q12H  . thiamine  100 mg Intravenous Daily      Bufford Buttner, MD [facility] pgr [phone] 08/24/2016, 9:28 AM

## 2016-08-24 NOTE — Progress Notes (Signed)
 1mg  of ativan wasted in sink. Witnessed by Tory EmeraldMichelle Toler RN.

## 2016-08-24 NOTE — Progress Notes (Signed)
Daily Rounding Note  08/24/2016, 11:32 AM  LOS: 2 days   SUBJECTIVE:   Chief complaint: confusion.  This persists.  Sitters at bedside.      Urine output 800 ml yesterday, 500 ml so far today. Developing small spontaneous bruises in various locations.  Some bleeding from lips and mouth   OBJECTIVE:         Vital signs in last 24 hours:    Temp:  [96.7 F (35.9 C)-98.2 F (36.8 C)] 96.7 F (35.9 C) (06/21 0733) Pulse Rate:  [79-93] 88 (06/21 1100) Resp:  [16-34] 26 (06/21 1100) BP: (96-137)/(56-88) 115/77 (06/21 1100) SpO2:  [93 %-100 %] 98 % (06/21 1100) Last BM Date: 08/24/16 Filed Weights   08/22/16 1557  Weight: 83.9 kg (185 lb)   General: jaundiced   Heart: RRR Chest: no labored breathing.  Clear bil.  Abdomen: tense, protuberant, NT.  Hypoactive BS.    Extremities: no pedal edema.  No scrotal edema Neuro/Psych:  Awakens to his name.  Not oriented but follows commands.  No-purposeful limb  movements  Intake/Output from previous day: 06/20 0701 - 06/21 0700 In: 3670 [P.O.:120; I.V.:1800; IV Piggyback:1750] Out: 800 [Urine:800]  Intake/Output this shift: Total I/O In: 675 [I.V.:375; IV Piggyback:300] Out: 500 [Urine:500]  Lab Results:  Recent Labs  08/22/16 1558  08/23/16 0603 08/23/16 1154 08/24/16 0252  WBC 39.2*  --   --  35.8* 36.8*  HGB 11.3*  < > 9.7* 9.7* 9.4*  HCT 30.8*  < > 26.2* 26.9* 26.6*  PLT 147*  --   --  102* 118*  < > = values in this interval not displayed. BMET  Recent Labs  08/22/16 2326 08/23/16 0603 08/24/16 0252  NA 116* 117*  118* 120*  K 2.3* 2.2*  2.2* 2.2*  CL 80* 83*  83* 87*  CO2 19* 21*  22 21*  GLUCOSE 202* 188*  190* 117*  BUN 55* 52*  53* 61*  CREATININE 3.24* 3.20*  3.23* 3.41*  CALCIUM 7.7* 7.4*  7.3* 7.2*   LFT  Recent Labs  08/22/16 1558 08/24/16 0252  PROT 5.3* 4.8*  ALBUMIN 1.9* 2.6*  AST 123* 77*  ALT 50 33  ALKPHOS 209* 111    BILITOT 36.6* 39.9*  BILIDIR 23.2*  --   IBILI 13.4*  --    PT/INR  Recent Labs  08/23/16 0603 08/24/16 0252  LABPROT 21.4* 18.6*  INR 1.82 1.54   Hepatitis Panel  Recent Labs  08/22/16 1931  HEPBSAG Negative  HCVAB <0.1  HEPAIGM Negative  HEPBIGM Negative    Studies/Results: Ct Head Wo Contrast  Result Date: 08/22/2016 CLINICAL DATA:  Multiple falls at home. Dizziness with standing. Diabetes. EXAM: CT HEAD WITHOUT CONTRAST TECHNIQUE: Contiguous axial images were obtained from the base of the skull through the vertex without intravenous contrast. COMPARISON:  None. FINDINGS: Brain: Mild cerebral and cerebellar volume loss for age. No mass lesion, hemorrhage, hydrocephalus, acute infarct, intra-axial, or extra-axial fluid collection. No mass lesion, hemorrhage, hydrocephalus, acute infarct, intra-axial, or extra-axial fluid collection. Vascular: No hyperdense vessel or unexpected calcification. Skull: No significant soft tissue swelling.  No skull fracture. Sinuses/Orbits: Normal imaged portions of the orbits and globes. Hypoplastic right frontal sinus. Other paranasal sinuses and mastoid air cells clear. Other: None. IMPRESSION: No acute intracranial abnormality. Electronically Signed   By: Abigail Miyamoto M.D.   On: 08/22/2016 17:25   US Abdomen Complete  Result Date: 08/22/2016 CLINICAL  DATA:  Acute onset of jaundice. Assess for cirrhosis. Initial encounter. EXAM: ABDOMEN ULTRASOUND COMPLETE COMPARISON:  Right upper quadrant ultrasound performed 07/27/2016 FINDINGS: Gallbladder: Gallbladder wall thickening is noted. This is nonspecific in the presence of ascites. Underlying sludge is noted within the gallbladder. No definite stones are seen. No ultrasonographic Murphy's sign is elicited. Common bile duct: Diameter: 0.5 cm, within normal limits in caliber. Liver: A 1.4 cm cyst is noted at the left hepatic lobe. The nodular contour of the liver is compatible with hepatic cirrhosis.  Small volume ascites is seen tracking about the liver. IVC: Not well characterized. Pancreas: Not well seen. Spleen: Size and appearance within normal limits. Right Kidney: Length: 12.8 cm. Echogenicity within normal limits. No mass or hydronephrosis visualized. Left Kidney: Length: 11.6 cm. Echogenicity within normal limits. No mass or hydronephrosis visualized. Abdominal aorta: Not characterized due to the patient's habitus. Other findings: None. IMPRESSION: 1. Small volume ascites noted about the liver. Nodular contour of the liver is compatible with hepatic cirrhosis. 2. 1.4 cm cyst at the left hepatic lobe. 3. Gallbladder wall thickening is nonspecific in the presence of ascites. Underlying gallbladder sludge noted. No definite stones seen. Electronically Signed   By: Garald Balding M.D.   On: 08/22/2016 22:33   Dg Chest Port 1 View  Result Date: 08/22/2016 CLINICAL DATA:  Hepatitis. EXAM: PORTABLE CHEST 1 VIEW COMPARISON:  Radiograph of December 28, 2013. FINDINGS: The heart size and mediastinal contours are within normal limits. Both lungs are clear. No pneumothorax or pleural effusion is noted. Old right rib fracture is noted. IMPRESSION: No acute cardiopulmonary abnormality seen. Electronically Signed   By: Marijo Conception, M.D.   On: 08/22/2016 21:22   Scheduled Meds: . feeding supplement (GLUCERNA SHAKE)  237 mL Oral TID BM  . folic acid  1 mg Intravenous Daily  . insulin aspart  0-15 Units Subcutaneous Q4H  . lactulose  300 mL Rectal Q8H  . methylPREDNISolone (SOLU-MEDROL) injection  60 mg Intravenous Q12H  . midodrine  5 mg Oral TID WC  . multivitamin with minerals  1 tablet Oral Daily  . octreotide  200 mcg Subcutaneous TID  . pantoprazole (PROTONIX) IV  40 mg Intravenous Q12H  . thiamine  100 mg Intravenous Daily   Continuous Infusions: . sodium chloride 75 mL/hr (08/24/16 0602)  . albumin human 25 g (08/24/16 0510)  . cefTRIAXone (ROCEPHIN)  IV Stopped (08/23/16 2036)   PRN  Meds:.ipratropium-albuterol, LORazepam   ASSESMENT:   *  ETOH hepatitis.  Cirrhosis.  Discriminant fx 52.  IV Solumedrol initiated until he can take po Prednisolone 40 mg daily.  Rising T bil.  Alk phos and AST improved. coags improved.    *  AKI.  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  IV albumin q 8 hours (day 2 of 48 hour challenge), Midodrine, TID octreotide in place.  Renal function not improving.    *  Ascites, small volume.  At some point should tap to assess for SBP.  Rocephin in place.   *  Hyponatremia.  Improved.     *  Hypokalemia.  Persists.    *  Coagulopathy, improved.   *  Anemia, macrocytic.  FOBT positive. Remains on BID IV Protonix.  Rocephin in place   *  Thrombocytopenia.  Non-critical.     *  AMS, confusion.  Elevated ammonia level (100).  Lactulose in place.   *  Severe PCM.    *  Chronic alcoholism, dependency.    *  DM 2.  Previously diet controlled.    PLAN   *  Supportive care.     Azucena Freed  08/24/2016, 11:32 AM Pager: [phone]     Attending physician's note   I have taken an interval history, reviewed the chart and examined the patient. I agree with the Advanced Practitioner's note, impression and recommendations. As above. Continue IV solumedrol, IV Rocephin, IV Protonix, lactulose, supportive care. Prognosis is poor with severe etoh hepatitis and HRS.  Lucio Edward, MD [facility] [phone] Mon-Fri 8a-5p [phone] after 5p, weekends, holidays

## 2016-08-24 NOTE — Progress Notes (Signed)
[facility]Pt transported via bed SWOT Designer, television/film setN and sitter. Stable at transfer.

## 2016-08-24 NOTE — Progress Notes (Signed)
Patient ID: Tyler Clarke, male   DOB: 07-Jul-1975, 41 y.o.   MRN: [id number]                                                                PROGRESS NOTE                                                                                                                                                                                                             Patient Demographics:    Tyler Clarke, is a 41 y.o. male, DOB - 10-26-1975, JXB:[id number]  Admit date - 08/22/2016   Admitting Physician Nelda Bucks, MD  Outpatient Primary MD for the patient is Kaleen Mask, MD  LOS - 2  Outpatient Specialists:    Chief Complaint  Patient presents with  . Weakness       Brief Narrative  41 year old male with PMH of current smoker, heavy alcohol abuse, and DM who was sent to the ER today by his primary MD for abnormal labs and slurred speech that started on Wednesday.    Patient reports vaguely not to be feeling well for the last several weeks and reports a yellowish tint to his skin that started several days ago.  He additionally admits to recent falls at home.  Denies any abdominal pain, cough, shortness of breath, fever, chills, nausea, vomiting, diarrhea, bloody stools, weight gain, swelling, or dizziness.  He reports he currently smokes half to one pack of cigarettes a day, drinks 24 beers daily (last drink yesterday), and denies any recreational drug use.  Reports his diabetes was previously diet controlled.    In the ED, he was noted to have significant jaundice and hypotension of 80/51, heart rate 91,afebrile 97.3, and 96% on room air. He has been alert and oriented.  Labs noted for Na 114, K < 2, Cl 20, glucose 304, BUN 53, sCr 3.15 (previously 0.4 on 07/27/16), AG 21, albumin 1.9, AST 123, t. Bili 36.6,  WBC 39.2, Hgb 11.3, Hct 30.8, plts 147, INR 1.61, PT 19.3, other labs pending.  Head CT for recurrent falls was negative. EKG noted for prolonged QTc 0.540.  Stool was guaiac  positive.  He was treated with 1L NS, 1 L LR, started on KCL and mag replacement, protonix bolus and  drip started, and placed on octreotide.  Systolic blood pressure remains in the 90's.  PCCM to admit to ICU for hepatic and renal dysfunction.    STUDIES:  09/06/2022 Heat CT >> normal 2022/09/06 [place] abd >> small volume ascites, nodular contour of liver c/w hepatic cirrhosis; 1.4 cm cyst at the left hepatic lobe; gallbladder wall thickening is nonspecific in the presence of ascites, underlying gallbladder sludge, no definite stones 6/20 Renal US >>  CULTURES: 2022-09-06 BC >>  ANTIBIOTICS: September 06, 2022 ceftriaxone >>  SIGNIFICANT EVENTS: Sep 06, 2022 Admit   LINES/TUBES: PIV    Subjective:    [name] today still is confused.  Pt didn't receive anything by mouth due to being confused.   No headache, No chest pain, No abdominal pain - No Nausea, No new weakness tingling or numbness, No Cough - SOB.    Assessment  & Plan :    Active Problems:   Liver failure (HCC)   Alcoholic cirrhosis of liver without ascites (HCC)   Pressure injury of skin  AKI - oliguric - likely hepatorenal syndrome  Hyponatremia- beer potomania  Hypokalemia Hypomagnesium Hypoalbuminemia AGMA - lactate acidosis +/- renal failure NS at 75 ml/lhr  S/p KCL replacement x 6 runs 6/21 Bmp q6 Appreciate nephrology input Avoid nephrotoxic agents, ensure adequate renal perfusion    Leukocytosis-  ddx include acute hepatitis vs SBP Trend WBC Monitor fever curve  Trend PCT  Follow cultures Continue ceftriaxone, dose adjusted 6/20   Acute alcoholic hepatitis - MELD score 36  Severe protein calorie malnutrition Possible cirrhosis  R/o GI bleed - low suspicion given no vomiting or melena  - ammonia 100, HCG 2, ETOH neg ? Hepatic encephalopathy  Abd [place] with mild volume ascites, nodular liver Appreciate GI assistance Cautious with OG/NG tube for possible varices Pending AFP, acute hepatitis panel Consider IR consult for diagnostic  paracentesis send fluid for cell count, culture, and albumin Continue ceftriaxone for empiric SBP coverage, dose adjusted 6/20 to 2gm Continue solumedrol 40 mg daily   Continue albumin 5% q 6hr  Continue lactulose Trend ammonia levels  Continue protonix drip to 40mg  IV BID  PMT consult    Anemia - mild  Thrombocytopenia- Trend CBC SCDs  Shock - ddx etiology sepsis vs less likely hypovolemic/ blood loss vs ?etoh cardiomyopathy Prolonged QTc - currently hemodynamically stable 6/20 Tele monitoring   Goal map > 65 Avoid meds that prolong QTc  Consider TTE  Tobacco abuse - CXR normal O2 prn for sats > 94% Pulmonary hygiene with IS  Duonebs q4 hr prn  Smoking cessation   DM uncontrolled - cortisol 39.6 SSI  CBG q 4  Alcohol abuse - heavy, 24 beers daily,  Falls Slurred speech w/ no focal deficits - improved Hepatic encephalopathy Hx ? depression - head CT neg  SDU CIWA protocol -> d/c versed, start ativan  Monitor neuro status  Continue daily MVI/ folate/ thiamine Lactulose , change to rectal CSW consulting   FAMILY  - Updates: Patient's mother, Matvey Llanas ([phone])  Has agree to DRN/DNI and agrees to palliative care consult to prior notes - Inter-disciplinary family meet or Palliative Care meeting due by:  6/26    Code Status : DNR  Family Communication  : no family at bedside  Disposition Plan  :  Cont SDU  Barriers For Discharge :   Consults  :  Nephrology, GI  Procedures  :   DVT Prophylaxis  :  SCDs   Lab Results  Component Value Date  PLT 118 (L) 08/24/2016    Antibiotics  :  Rocephin 6/19=>  Anti-infectives    Start     Dose/Rate Route Frequency Ordered Stop   08/23/16 2000  cefTRIAXone (ROCEPHIN) 2 g in dextrose 5 % 50 mL IVPB     2 g 100 mL/hr over 30 Minutes Intravenous Every 24 hours 08/23/16 0914 08/29/16 1959   08/22/16 2000  cefTRIAXone (ROCEPHIN) 1 g in dextrose 5 % 50 mL IVPB  Status:  Discontinued     1 g 100  mL/hr over 30 Minutes Intravenous Every 24 hours 08/22/16 1941 08/23/16 0914        Objective:   Vitals:   08/24/16 0310 08/24/16 0322 08/24/16 0400 08/24/16 0500  BP:   120/81 107/84  Pulse: 93  84 87  Resp: (!) 34  (!) 27 (!) 26  Temp:  97.8 F (36.6 C)    TempSrc:  Axillary    SpO2: 95%  96% 95%  Weight:      Height:        Wt Readings from Last 3 Encounters:  08/22/16 83.9 kg (185 lb)  07/27/16 83.9 kg (185 lb)  06/25/14 92.5 kg (204 lb)     Intake/Output Summary (Last 24 hours) at 08/24/16 0528 Last data filed at 08/24/16 0500  Gross per 24 hour  Intake             3670 ml  Output              800 ml  Net             2870 ml     Physical Exam  Awake Alert, Oriented X 1, No new F.N deficits, Normal affect Rossie.AT,PERRAL Supple Neck,No JVD, No cervical lymphadenopathy appriciated.  Symmetrical Chest wall movement, Good air movement bilaterally, CTAB RRR,No Gallops,Rubs or new Murmurs, No Parasternal Heave +ve B.Sounds, Abd Soft, No tenderness, No organomegaly appriciated, No rebound - guarding or rigidity. No Cyanosis, Clubbing or edema, No new Rash or bruise   Foley in place    Data Review:    CBC  Recent Labs Lab 08/22/16 1558 08/22/16 1946 08/22/16 2138 08/23/16 0603 08/23/16 1154 08/24/16 0252  WBC 39.2*  --   --   --  35.8* 36.8*  HGB 11.3* 10.9* 10.8* 9.7* 9.7* 9.4*  HCT 30.8* 32.0* 29.8* 26.2* 26.9* 26.6*  PLT 147*  --   --   --  102* 118*  MCV 102.7*  --   --   --  103.9* 106.0*  MCH 37.7*  --   --   --  37.5* 37.5*  MCHC 36.7*  --   --   --  36.1* 35.3  RDW 12.8  --   --   --  13.0 12.9  LYMPHSABS  --   --   --   --  1.4  --   MONOABS  --   --   --   --  1.1*  --   EOSABS  --   --   --   --  0.0  --   BASOSABS  --   --   --   --  0.0  --     Chemistries   Recent Labs Lab 08/22/16 1558 08/22/16 1749 08/22/16 1931 08/22/16 1946 08/22/16 2138 08/22/16 2326 08/23/16 0603 08/23/16 1154 08/24/16 0252  NA 114*  --  115* 115*  114* 116* 117*  118*  --  120*  K <2.0*  --  2.1*  2.2* 2.1* 2.3* 2.2*  2.2*  --  2.2*  CL 73*  --  79* 76* 79* 80* 83*  83*  --  87*  CO2 20*  --  21*  --  18* 19* 21*  22  --  21*  GLUCOSE 304*  --  269* 271* 252* 202* 188*  190*  --  117*  BUN 53*  --  53* 47* 54* 55* 52*  53*  --  61*  CREATININE 3.15*  --  3.13* 3.10* 3.18* 3.24* 3.20*  3.23*  --  3.41*  CALCIUM 8.7*  --  7.9*  --  7.8* 7.7* 7.4*  7.3*  --  7.2*  MG  --  2.3  --   --   --   --   --  2.6*  --   AST 123*  --   --   --   --   --   --   --  77*  ALT 50  --   --   --   --   --   --   --  33  ALKPHOS 209*  --   --   --   --   --   --   --  111  BILITOT 36.6*  --   --   --   --   --   --   --  39.9*   ------------------------------------------------------------------------------------------------------------------ No results for input(s): CHOL, HDL, LDLCALC, TRIG, CHOLHDL, LDLDIRECT in the last 72 hours.  No results found for: HGBA1C ------------------------------------------------------------------------------------------------------------------ No results for input(s): TSH, T4TOTAL, T3FREE, THYROIDAB in the last 72 hours.  Invalid input(s): FREET3 ------------------------------------------------------------------------------------------------------------------ No results for input(s): VITAMINB12, FOLATE, FERRITIN, TIBC, IRON, RETICCTPCT in the last 72 hours.  Coagulation profile  Recent Labs Lab 08/22/16 1749 08/23/16 0603 08/24/16 0252  INR 1.61 1.82 1.54    No results for input(s): DDIMER in the last 72 hours.  Cardiac Enzymes No results for input(s): CKMB, TROPONINI, MYOGLOBIN in the last 168 hours.  Invalid input(s): CK ------------------------------------------------------------------------------------------------------------------ No results found for: BNP  Inpatient Medications  Scheduled Meds: . feeding supplement (GLUCERNA SHAKE)  237 mL Oral TID BM  . folic acid  1 mg Intravenous Daily   . insulin aspart  0-15 Units Subcutaneous Q4H  . lactulose  30 g Oral TID  . methylPREDNISolone (SOLU-MEDROL) injection  60 mg Intravenous Q12H  . midodrine  5 mg Oral TID WC  . multivitamin with minerals  1 tablet Oral Daily  . octreotide  200 mcg Subcutaneous TID  . pantoprazole (PROTONIX) IV  40 mg Intravenous Q12H  . thiamine  100 mg Intravenous Daily   Continuous Infusions: . sodium chloride 75 mL/hr at 08/23/16 1452  . albumin human 25 g (08/24/16 0510)  . cefTRIAXone (ROCEPHIN)  IV Stopped (08/23/16 2036)  . potassium chloride 10 mEq (08/24/16 0508)   PRN Meds:.ipratropium-albuterol, LORazepam  Micro Results Recent Results (from the past 240 hour(s))  Blood culture (routine x 2)     Status: None (Preliminary result)   Collection Time: 08/22/16  5:40 PM  Result Value Ref Range Status   Specimen Description BLOOD RIGHT ANTECUBITAL  Final   Special Requests   Final    BOTTLES DRAWN AEROBIC AND ANAEROBIC Blood Culture adequate volume   Culture NO GROWTH < 24 HOURS  Final   Report Status PENDING  Incomplete  Blood culture (routine x 2)     Status: None (Preliminary result)   Collection Time: 08/22/16  5:49  PM  Result Value Ref Range Status   Specimen Description BLOOD BLOOD LEFT FOREARM  Final   Special Requests   Final    BOTTLES DRAWN AEROBIC AND ANAEROBIC Blood Culture adequate volume   Culture NO GROWTH < 24 HOURS  Final   Report Status PENDING  Incomplete  MRSA PCR Screening     Status: None   Collection Time: 08/22/16 11:15 PM  Result Value Ref Range Status   MRSA by PCR NEGATIVE NEGATIVE Final    Comment:        The GeneXpert MRSA Assay (FDA approved for NASAL specimens only), is one component of a comprehensive MRSA colonization surveillance program. It is not intended to diagnose MRSA infection nor to guide or monitor treatment for MRSA infections.     Radiology Reports Dg Lumbar Spine Complete  Result Date: 07/27/2016 CLINICAL DATA:  Low back pain  for 1 month EXAM: LUMBAR SPINE - COMPLETE 4+ VIEW COMPARISON:  None. FINDINGS: Lumbar alignment within normal limits. Mild degenerative changes at L1-L2, L2-L3 and L4-L5. No fracture or malalignment IMPRESSION: Mild degenerative changes.  No acute osseous abnormality. Electronically Signed   By: Jasmine Pang M.D.   On: 07/27/2016 14:07   Ct Head Wo Contrast  Result Date: 08/22/2016 CLINICAL DATA:  Multiple falls at home. Dizziness with standing. Diabetes. EXAM: CT HEAD WITHOUT CONTRAST TECHNIQUE: Contiguous axial images were obtained from the base of the skull through the vertex without intravenous contrast. COMPARISON:  None. FINDINGS: Brain: Mild cerebral and cerebellar volume loss for age. No mass lesion, hemorrhage, hydrocephalus, acute infarct, intra-axial, or extra-axial fluid collection. No mass lesion, hemorrhage, hydrocephalus, acute infarct, intra-axial, or extra-axial fluid collection. Vascular: No hyperdense vessel or unexpected calcification. Skull: No significant soft tissue swelling.  No skull fracture. Sinuses/Orbits: Normal imaged portions of the orbits and globes. Hypoplastic right frontal sinus. Other paranasal sinuses and mastoid air cells clear. Other: None. IMPRESSION: No acute intracranial abnormality. Electronically Signed   By: Jeronimo Greaves M.D.   On: 08/22/2016 17:25   US Abdomen Complete  Result Date: 08/22/2016 CLINICAL DATA:  Acute onset of jaundice. Assess for cirrhosis. Initial encounter. EXAM: ABDOMEN ULTRASOUND COMPLETE COMPARISON:  Right upper quadrant ultrasound performed 07/27/2016 FINDINGS: Gallbladder: Gallbladder wall thickening is noted. This is nonspecific in the presence of ascites. Underlying sludge is noted within the gallbladder. No definite stones are seen. No ultrasonographic Murphy's sign is elicited. Common bile duct: Diameter: 0.5 cm, within normal limits in caliber. Liver: A 1.4 cm cyst is noted at the left hepatic lobe. The nodular contour of the liver is  compatible with hepatic cirrhosis. Small volume ascites is seen tracking about the liver. IVC: Not well characterized. Pancreas: Not well seen. Spleen: Size and appearance within normal limits. Right Kidney: Length: 12.8 cm. Echogenicity within normal limits. No mass or hydronephrosis visualized. Left Kidney: Length: 11.6 cm. Echogenicity within normal limits. No mass or hydronephrosis visualized. Abdominal aorta: Not characterized due to the patient's habitus. Other findings: None. IMPRESSION: 1. Small volume ascites noted about the liver. Nodular contour of the liver is compatible with hepatic cirrhosis. 2. 1.4 cm cyst at the left hepatic lobe. 3. Gallbladder wall thickening is nonspecific in the presence of ascites. Underlying gallbladder sludge noted. No definite stones seen. Electronically Signed   By: Roanna Raider M.D.   On: 08/22/2016 22:33   Dg Chest Port 1 View  Result Date: 08/22/2016 CLINICAL DATA:  Hepatitis. EXAM: PORTABLE CHEST 1 VIEW COMPARISON:  Radiograph of December 28, 2013.  FINDINGS: The heart size and mediastinal contours are within normal limits. Both lungs are clear. No pneumothorax or pleural effusion is noted. Old right rib fracture is noted. IMPRESSION: No acute cardiopulmonary abnormality seen. Electronically Signed   By: Lupita Raider, M.D.   On: 08/22/2016 21:22   US Abdomen Limited Ruq  Result Date: 07/27/2016 CLINICAL DATA:  Abnormal liver function tests. EXAM: US ABDOMEN LIMITED - RIGHT UPPER QUADRANT COMPARISON:  None. FINDINGS: Gallbladder: No gallstones or wall thickening visualized. No sonographic Murphy sign noted by sonographer. Small amount of sludge is seen within the gallbladder. Common bile duct: Diameter: 4.5 mm which is within normal limits. Liver: Increased echogenicity of hepatic parenchyma is noted suggesting fatty infiltration. 1.6 cm left hepatic cyst is noted. IMPRESSION: Gallbladder sludge is noted. Increased echogenicity of hepatic parenchyma suggesting  fatty infiltration or other diffuse hepatocellular disease. Small left hepatic cyst is noted. Electronically Signed   By: Lupita Raider, M.D.   On: 07/27/2016 15:14    Time Spent in minutes  30   Pearson Grippe M.D on 08/24/2016 at 5:28 AM  Between 7am to 7pm - Pager - [phone]  After 7pm go to [email or web address] - password [facility]  [facility] -  Office  [phone]

## 2016-08-24 NOTE — Progress Notes (Signed)
CRITICAL VALUE ALERT  Critical Value:  K 2.2  Date & Time Notied:  08/24/16 0447  Provider Notified: Dr. Belia HemanKasa  Orders Received/Actions taken: new order pending

## 2016-08-25 DIAGNOSIS — D72828 Other elevated white blood cell count: Secondary | ICD-10-CM

## 2016-08-25 DIAGNOSIS — E43 Unspecified severe protein-calorie malnutrition: Secondary | ICD-10-CM

## 2016-08-25 DIAGNOSIS — F1010 Alcohol abuse, uncomplicated: Secondary | ICD-10-CM

## 2016-08-25 DIAGNOSIS — E876 Hypokalemia: Secondary | ICD-10-CM

## 2016-08-25 DIAGNOSIS — R9431 Abnormal electrocardiogram [ECG] [EKG]: Secondary | ICD-10-CM

## 2016-08-25 DIAGNOSIS — K7200 Acute and subacute hepatic failure without coma: Secondary | ICD-10-CM

## 2016-08-25 DIAGNOSIS — Z720 Tobacco use: Secondary | ICD-10-CM

## 2016-08-25 LAB — CBC
HCT: 26.5 % — ABNORMAL LOW (ref 39.0–52.0)
HEMOGLOBIN: 9.1 g/dL — AB (ref 13.0–17.0)
MCH: 37.1 pg — ABNORMAL HIGH (ref 26.0–34.0)
MCHC: 34.3 g/dL (ref 30.0–36.0)
MCV: 108.2 fL — ABNORMAL HIGH (ref 78.0–100.0)
PLATELETS: 134 10*3/uL — AB (ref 150–400)
RBC: 2.45 MIL/uL — ABNORMAL LOW (ref 4.22–5.81)
RDW: 12.9 % (ref 11.5–15.5)
WBC: 34.2 10*3/uL — ABNORMAL HIGH (ref 4.0–10.5)

## 2016-08-25 LAB — LIPID PANEL
Cholesterol: 110 mg/dL (ref 0–200)
TRIGLYCERIDES: 150 mg/dL — AB (ref <150)
VLDL: 30 mg/dL (ref 0–40)

## 2016-08-25 LAB — AMMONIA: Ammonia: 124 umol/L — ABNORMAL HIGH (ref 9–35)

## 2016-08-25 LAB — COMPREHENSIVE METABOLIC PANEL
ALBUMIN: 3.1 g/dL — AB (ref 3.5–5.0)
ALT: 36 U/L (ref 17–63)
ANION GAP: 13 (ref 5–15)
AST: 85 U/L — ABNORMAL HIGH (ref 15–41)
Alkaline Phosphatase: 101 U/L (ref 38–126)
BUN: 68 mg/dL — ABNORMAL HIGH (ref 6–20)
CHLORIDE: 96 mmol/L — AB (ref 101–111)
CO2: 18 mmol/L — AB (ref 22–32)
Calcium: 7.1 mg/dL — ABNORMAL LOW (ref 8.9–10.3)
Creatinine, Ser: 2.85 mg/dL — ABNORMAL HIGH (ref 0.61–1.24)
GFR calc non Af Amer: 26 mL/min — ABNORMAL LOW (ref >60)
GFR, EST AFRICAN AMERICAN: 30 mL/min — AB (ref >60)
GLUCOSE: 110 mg/dL — AB (ref 65–99)
POTASSIUM: 2.6 mmol/L — AB (ref 3.5–5.1)
SODIUM: 127 mmol/L — AB (ref 135–145)
Total Bilirubin: 19.6 mg/dL (ref 0.3–1.2)
Total Protein: 5.1 g/dL — ABNORMAL LOW (ref 6.5–8.1)

## 2016-08-25 LAB — GLUCOSE, CAPILLARY
GLUCOSE-CAPILLARY: 112 mg/dL — AB (ref 65–99)
GLUCOSE-CAPILLARY: 194 mg/dL — AB (ref 65–99)
GLUCOSE-CAPILLARY: 185 mg/dL — AB (ref 65–99)
Glucose-Capillary: 157 mg/dL — ABNORMAL HIGH (ref 65–99)
Glucose-Capillary: 165 mg/dL — ABNORMAL HIGH (ref 65–99)
Glucose-Capillary: 93 mg/dL (ref 65–99)

## 2016-08-25 LAB — MAGNESIUM
MAGNESIUM: 2.9 mg/dL — AB (ref 1.7–2.4)
Magnesium: 2.8 mg/dL — ABNORMAL HIGH (ref 1.7–2.4)

## 2016-08-25 LAB — POTASSIUM: POTASSIUM: 3.2 mmol/L — AB (ref 3.5–5.1)

## 2016-08-25 MED ORDER — LACTULOSE 10 GM/15ML PO SOLN
20.0000 g | Freq: Three times a day (TID) | ORAL | Status: DC
  Administered 2016-08-25 (×2): 20 g via ORAL
  Filled 2016-08-25 (×2): qty 30

## 2016-08-25 MED ORDER — PANTOPRAZOLE SODIUM 40 MG PO TBEC
40.0000 mg | DELAYED_RELEASE_TABLET | Freq: Every day | ORAL | Status: DC
  Administered 2016-08-25 – 2016-09-01 (×7): 40 mg via ORAL
  Filled 2016-08-25 (×9): qty 1

## 2016-08-25 MED ORDER — PREDNISOLONE 5 MG PO TABS
40.0000 mg | ORAL_TABLET | Freq: Every day | ORAL | Status: DC
  Administered 2016-08-26 – 2016-08-31 (×6): 40 mg via ORAL
  Administered 2016-09-01: 5 mg via ORAL
  Administered 2016-09-01: 35 mg via ORAL
  Administered 2016-09-02 – 2016-09-06 (×3): 40 mg via ORAL
  Filled 2016-08-25 (×13): qty 8

## 2016-08-25 MED ORDER — POTASSIUM CHLORIDE 10 MEQ/100ML IV SOLN
10.0000 meq | INTRAVENOUS | Status: AC
  Administered 2016-08-25 (×3): 10 meq via INTRAVENOUS
  Filled 2016-08-25 (×3): qty 100

## 2016-08-25 MED ORDER — RIFAXIMIN 550 MG PO TABS
550.0000 mg | ORAL_TABLET | Freq: Two times a day (BID) | ORAL | Status: DC
  Administered 2016-08-25 – 2016-09-06 (×19): 550 mg via ORAL
  Filled 2016-08-25 (×21): qty 1

## 2016-08-25 MED ORDER — SODIUM CHLORIDE 0.9 % IV SOLN
INTRAVENOUS | Status: DC
  Administered 2016-08-25 – 2016-08-26 (×2): via INTRAVENOUS

## 2016-08-25 MED ORDER — POTASSIUM PHOSPHATES 15 MMOLE/5ML IV SOLN
30.0000 meq | Freq: Once | INTRAVENOUS | Status: AC
  Administered 2016-08-25: 30 meq via INTRAVENOUS
  Filled 2016-08-25: qty 6.82

## 2016-08-25 MED ORDER — PANTOPRAZOLE SODIUM 40 MG IV SOLR: 40.0000 mg | INTRAVENOUS | Status: DC

## 2016-08-25 MED ORDER — LACTULOSE 10 GM/15ML PO SOLN
30.0000 g | Freq: Four times a day (QID) | ORAL | Status: DC
  Administered 2016-08-25 – 2016-08-27 (×10): 30 g via ORAL
  Filled 2016-08-25 (×10): qty 45

## 2016-08-25 NOTE — Progress Notes (Signed)
PROGRESS NOTE    Tyler Clarke  ZOX:[id number] DOB: Jul 20, 1975 DOA: 08/22/2016 PCP: Kaleen Mask, MD   Brief Narrative:  41 year old WM PMHx Current smoker, heavy EtOH Abuse, DM type 2,Polycythemia  Who was sent to the ER today by his primary MD for abnormal labs and slurred speech that started on Wednesday.    Patient reports vaguely not to be feeling well for the last several weeks and reports a yellowish tint to his skin that started several days ago.  He additionally admits to recent falls at home.  Denies any abdominal pain, cough, shortness of breath, fever, chills, nausea, vomiting, diarrhea, bloody stools, weight gain, swelling, or dizziness.  He reports he currently smokes half to one pack of cigarettes a day, drinks 24 beers daily (last drink yesterday), and denies any recreational drug use.  Reports his diabetes was previously diet controlled.    In the ED, he was noted to have significant jaundice and hypotension of 80/51, heart rate 91,afebrile 97.3, and 96% on room air. He has been alert and oriented.  Labs noted for Na 114, K < 2, Cl 20, glucose 304, BUN 53, sCr 3.15 (previously 0.4 on 07/27/16), AG 21, albumin 1.9, AST 123, t. Bili 36.6,  WBC 39.2, Hgb 11.3, Hct 30.8, plts 147, INR 1.61, PT 19.3, other labs pending.  Head CT for recurrent falls was negative. EKG noted for prolonged QTc 0.540.  Stool was guaiac positive.  He was treated with 1L NS, 1 L LR, started on KCL and mag replacement, protonix bolus and drip started, and placed on octreotide.  Systolic blood pressure remains in the 90's.  PCCM to admit to ICU for hepatic and renal dysfunction.     Subjective: 6/22 A/O 2 (does not know when, why), follows commands. Negative CP, negative SOB, negative N/V   Assessment & Plan:   Active Problems:   Liver failure (HCC)   Alcoholic cirrhosis of liver without ascites (HCC)   Pressure injury of skin   Encephalopathy   Acute renal failure/oliguric  - likely  hepatorenal syndrome  Lab Results  Component Value Date   CREATININE 2.85 (H) 08/25/2016   CREATININE 3.01 (H) 08/24/2016   CREATININE 3.41 (H) 08/24/2016  -Improving continue normal saline 10ml/hr  -Avoid nephrotoxic agents  Hyponatremia - beer potomania should improve with hydration   Hypokalemia -Potassium 60 mEq -Recheck K/MG @1300 : Repeat potassium low potassium 60 mEq  Hypomagnesium -Monitor closely  Leukocytosis - ddx includeacute hepatitis vs SBP -Trend WBC Recent Labs Lab 08/22/16 1558 08/23/16 1154 08/24/16 0252 08/25/16 0259  WBC 39.2* 35.8* 36.8* 34.2*  -Afebrile last 24 hour's -Continue ceftriaxone for SBP prophylaxis, dose adjusted 6/20  Alcohol abuse(24 beers daily),/Acute alcoholic hepatitis with ascites - MELD score 36  -SDU CIWA protocol ->d/c versed, start ativan  -Monitor neuro status  -Continue daily MVI/ folate/ thiamine  Severe protein calorie malnutrition  Alcoholic Hepatic cirrhosis with ascites -Abdominal ultrasound consistent with hepatic cirrhosis: Minimal ascites -Pending AFP, Acute hepatitis panel negative -Prednisolone 40 mg daily -Protonix 40 mg daily -Octreotide 200 g TID  Hepatic Encephalopathy  Recent Labs Lab 08/22/16 1749 08/22/16 1931 08/25/16 0259  AMMONIA 72* 100* 124*  -Increase lactulose 30 g QID -Rifaximin 550 mg BID  Anemia  - mild   Thrombocytopenia- -Negative sign of overt bleeding continue to monitor closely  Cardiomyopathy?/Prolonged QTc -Goal map > 65 -Avoid meds that prolong QTc  -Echocardiogram pending  Tobacco abuse - CXR normal -O2 prn for sats >  94% -Pulmonary hygiene with IS  -Duonebs q4 hr prn  -Smoking cessation   DM uncontrolled with complication - cortisol 39.6 -Hemoglobin A1c pending -Lipid panel -Moderate SSI  -CBG q 4       DVT prophylaxis: SCD Code Status: DO NOT RESUSCITATE Family Communication: None  Disposition Plan: TBD   Consultants:  [facility]  Dr. Claudette HeadMalcolm Stark Nephrology Dr. Bufford ButtnerElizabeth [facility]   Procedures/Significant Events:  6/19 Heat CT >> normal 6/19 US abd >> small volume ascites, nodular contour of liver c/w hepatic cirrhosis; 1.4 cm cyst at the left hepatic lobe; gallbladder wall thickening is nonspecific in the presence of ascites, underlying gallbladder sludge, no definite stones 6/20 Renal US >>    VENTILATOR SETTINGS:    Cultures 619 blood NGTD     Antimicrobials: Anti-infectives    Start     Stop   08/23/16 2000  cefTRIAXone (ROCEPHIN) 2 g in dextrose 5 % 50 mL IVPB     08/29/16 1959   08/22/16 2000  cefTRIAXone (ROCEPHIN) 1 g in dextrose 5 % 50 mL IVPB  Status:  Discontinued     08/23/16 0914       Devices    LINES / TUBES:  PIV     Continuous Infusions: . albumin human 25 g (08/24/16 0510)  . cefTRIAXone (ROCEPHIN)  IV Stopped (08/24/16 2215)  . potassium phosphate IVPB (mEq) 30 mEq (08/25/16 0622)     Objective: Vitals:   08/24/16 1739 08/24/16 1957 08/24/16 2353 08/25/16 0406  BP: 130/79 125/80 133/78 110/81  Pulse: 91 86 96   Resp: 17 (!) 25 (!) 29 (!) 25  Temp: 98 F (36.7 C) 97.5 F (36.4 C) 97.5 F (36.4 C) 97.6 F (36.4 C)  TempSrc: Oral Axillary Axillary Axillary  SpO2: 97% 96% 95% 95%  Weight: 185 lb 3 oz (84 kg)     Height: 6\' 2"  (1.88 m)       Intake/Output Summary (Last 24 hours) at 08/25/16 0729 Last data filed at 08/25/16 0600  Gross per 24 hour  Intake             2940 ml  Output             2330 ml  Net              610 ml   Filed Weights   08/22/16 1557 08/24/16 1739  Weight: 185 lb (83.9 kg) 185 lb 3 oz (84 kg)    Examination:  GeneralA/O 2 (does not know when, why), follows commands,: No acute respiratory distress Eyes: positive icterus ENT: Negative Runny nose, negative gingival bleeding, Neck:  Negative scars, masses, torticollis, lymphadenopathy, JVD Lungs: diffuse expiratory wheeze, negative crackles  Cardiovascular: Regular rate  and rhythm without murmur gallop or rub normal S1 and S2 Abdomen: negative abdominal pain,  positive distended (ascites?), positive soft, bowel sounds, no rebound, no ascites, no appreciable mass Extremities: No significant cyanosis, clubbing, or edema bilateral lower extremities Skin: Jaundice Psychiatric:  Unable to assess secondary to patient's altered mental status   Central nervous system:  Patient moves all extremities spontaneously, follows some commands, muscle strength all extremities appears 5/5, sensation intact, Cranial nerves II through XII intact, tongue/uvula midline, all extremities muscle strength 5/5, sensation intact t positive dysarthria, positive expressive aphasia, positive receptive aphasia.  .     Data Reviewed: Care during the described time interval was provided by me .  I have reviewed this patient's available data, including medical history,  events of note, physical examination, and all test results as part of my evaluation. I have personally reviewed and interpreted all radiology studies.  CBC:  Recent Labs Lab 08/22/16 1558  08/22/16 2138 08/23/16 0603 08/23/16 1154 08/24/16 0252 08/25/16 0259  WBC 39.2*  --   --   --  35.8* 36.8* 34.2*  NEUTROABS  --   --   --   --  33.3*  --   --   HGB 11.3*  < > 10.8* 9.7* 9.7* 9.4* 9.1*  HCT 30.8*  < > 29.8* 26.2* 26.9* 26.6* 26.5*  MCV 102.7*  --   --   --  103.9* 106.0* 108.2*  PLT 147*  --   --   --  102* 118* 134*  < > = values in this interval not displayed. Basic Metabolic Panel:  Recent Labs Lab 08/22/16 1749  08/22/16 2326 08/23/16 0603 08/23/16 1154 08/24/16 0252 08/24/16 1848 08/25/16 0259  NA  --   < > 116* 117*  118*  --  120* 124* 127*  K  --   < > 2.3* 2.2*  2.2*  --  2.2* 2.8* 2.6*  CL  --   < > 80* 83*  83*  --  87* 91* 96*  CO2  --   < > 19* 21*  22  --  21* 18* 18*  GLUCOSE  --   < > 202* 188*  190*  --  117* 170* 110*  BUN  --   < > 55* 52*  53*  --  61* 66* 68*  CREATININE  --    < > 3.24* 3.20*  3.23*  --  3.41* 3.01* 2.85*  CALCIUM  --   < > 7.7* 7.4*  7.3*  --  7.2* 7.0* 7.1*  MG 2.3  --   --   --  2.6*  --   --  2.8*  < > = values in this interval not displayed. GFR: Estimated Creatinine Clearance: 40.5 mL/min (A) (by C-G formula based on SCr of 2.85 mg/dL (H)). Liver Function Tests:  Recent Labs Lab 08/22/16 1558 08/24/16 0252 08/25/16 0259  AST 123* 77* 85*  ALT 50 33 36  ALKPHOS 209* 111 101  BILITOT 36.6* 39.9* 19.6*  PROT 5.3* 4.8* 5.1*  ALBUMIN 1.9* 2.6* 3.1*   No results for input(s): LIPASE, AMYLASE in the last 168 hours.  Recent Labs Lab 08/22/16 1749 08/22/16 1931 08/25/16 0259  AMMONIA 72* 100* 124*   Coagulation Profile:  Recent Labs Lab 08/22/16 1749 08/23/16 0603 08/24/16 0252  INR 1.61 1.82 1.54   Cardiac Enzymes: No results for input(s): CKTOTAL, CKMB, CKMBINDEX, TROPONINI in the last 168 hours. BNP (last 3 results) No results for input(s): PROBNP in the last 8760 hours. HbA1C: No results for input(s): HGBA1C in the last 72 hours. CBG:  Recent Labs Lab 08/24/16 1138 08/24/16 1528 08/24/16 1954 08/24/16 2357 08/25/16 0416  GLUCAP 141* 174* 178* 175* 112*   Lipid Profile: No results for input(s): CHOL, HDL, LDLCALC, TRIG, CHOLHDL, LDLDIRECT in the last 72 hours. Thyroid Function Tests: No results for input(s): TSH, T4TOTAL, FREET4, T3FREE, THYROIDAB in the last 72 hours. Anemia Panel: No results for input(s): VITAMINB12, FOLATE, FERRITIN, TIBC, IRON, RETICCTPCT in the last 72 hours. Urine analysis:    Component Value Date/Time   COLORURINE AMBER (A) 08/23/2016 0236   APPEARANCEUR HAZY (A) 08/23/2016 0236   LABSPEC 1.014 08/23/2016 0236   PHURINE 5.0 08/23/2016 0236   GLUCOSEU 50 (A)  08/23/2016 0236   HGBUR MODERATE (A) 08/23/2016 0236   BILIRUBINUR MODERATE (A) 08/23/2016 0236   KETONESUR NEGATIVE 08/23/2016 0236   PROTEINUR NEGATIVE 08/23/2016 0236   UROBILINOGEN 0.2 02/20/2010 1837   NITRITE  NEGATIVE 08/23/2016 0236   LEUKOCYTESUR NEGATIVE 08/23/2016 0236   Sepsis Labs: @LABRCNTIP (procalcitonin:4,lacticidven:4)  ) Recent Results (from the past 240 hour(s))  Blood culture (routine x 2)     Status: None (Preliminary result)   Collection Time: 08/22/16  5:40 PM  Result Value Ref Range Status   Specimen Description BLOOD RIGHT ANTECUBITAL  Final   Special Requests   Final    BOTTLES DRAWN AEROBIC AND ANAEROBIC Blood Culture adequate volume   Culture NO GROWTH 2 DAYS  Final   Report Status PENDING  Incomplete  Blood culture (routine x 2)     Status: None (Preliminary result)   Collection Time: 08/22/16  5:49 PM  Result Value Ref Range Status   Specimen Description BLOOD BLOOD LEFT FOREARM  Final   Special Requests   Final    BOTTLES DRAWN AEROBIC AND ANAEROBIC Blood Culture adequate volume   Culture NO GROWTH 2 DAYS  Final   Report Status PENDING  Incomplete  MRSA PCR Screening     Status: None   Collection Time: 08/22/16 11:15 PM  Result Value Ref Range Status   MRSA by PCR NEGATIVE NEGATIVE Final    Comment:        The GeneXpert MRSA Assay (FDA approved for NASAL specimens only), is one component of a comprehensive MRSA colonization surveillance program. It is not intended to diagnose MRSA infection nor to guide or monitor treatment for MRSA infections.          Radiology Studies: No results found.      Scheduled Meds: . feeding supplement (GLUCERNA SHAKE)  237 mL Oral TID BM  . folic acid  1 mg Intravenous Daily  . insulin aspart  0-15 Units Subcutaneous Q4H  . lactulose  300 mL Rectal Q8H  . methylPREDNISolone (SOLU-MEDROL) injection  60 mg Intravenous Q12H  . midodrine  5 mg Oral TID WC  . multivitamin with minerals  1 tablet Oral Daily  . octreotide  200 mcg Subcutaneous TID  . pantoprazole (PROTONIX) IV  40 mg Intravenous Q12H  . thiamine  100 mg Intravenous Daily   Continuous Infusions: . albumin human 25 g (08/24/16 0510)  .  cefTRIAXone (ROCEPHIN)  IV Stopped (08/24/16 2215)  . potassium phosphate IVPB (mEq) 30 mEq (08/25/16 0622)     LOS: 3 days    Time spent: 40 minutes    Advit Trethewey, Roselind Messier, MD [facility] Pager [phone]   If 7PM-7AM, please contact night-coverage [email or web address] Password [facility] 08/25/2016, 7:29 AM

## 2016-08-25 NOTE — Clinical Social Work Note (Signed)
CSW acknowledges substance abuse consult. Per RN, patient is not fully oriented. CSW will continue to follow orientation status before presenting substance abuse resources.  Charlynn CourtSarah Xiamara Hulet, CSW [phone]

## 2016-08-25 NOTE — Progress Notes (Signed)
[facility] Progress Note    Assessment/ Plan:   1.  Acute kidney injury: Occurring in the setting of acute alcoholic hepatitis superimposed on decompensated cirrhosis.  Urine sodium is < 10.  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  His urine output is picking up, and his creatinine is coming down.  I'll stop the albumin today.  If he's going to get a paracentesis (as is recommended by GI), we may as well continue the octreotide and midodrine until after this is accomplished.  Because of tenuous renal status still, don't recommend more than 2L for paracentesis and replete with 25 g albumin at the time of procedure.    2.  Acute decompensated cirrhosis secondary to EtOH abuse: solumedrol --> prednisolone today.  Has some HE, on lactulose too.  Being empirically treated with CTX for SBP as well.  Not a transplant candidate due to heavy ongoing EtOH abuse.  GI following  3.  Possible GIB: Hgb stable.  IV PPID BID  4.  Hypokalemia and hypomagnesemia: being repleted  5.  EtOH abuse: no signs of DTs, continuing CIWA protocol, substance abuse counseling  6.  Hyponatremia: poor prognostic indicator in liver disease; this is improving.    7.  Dispo: mother and I discussed palliative care c/s and she is agreeable.  She won't be here the rest of the day, but she is open to them calling her today or over the weekend.    Subjective:    Labs are improving.  Still confused but per mom is better.  Sleeping today.   Objective:   BP 112/85 (BP Location: Left Arm)   Pulse 91   Temp 98.3 F (36.8 C) (Oral)   Resp 20   Ht 6\' 2"  (1.88 m)   Wt 84 kg (185 lb 3 oz)   SpO2 97%   BMI 23.78 kg/m   Intake/Output Summary (Last 24 hours) at 08/25/16 1322 Last data filed at 08/25/16 1151  Gross per 24 hour  Intake             1650 ml  Output             2330 ml  Net             -680 ml   Weight change:   Physical Exam: GEN  ill appearing, lying in bed, sleeping HEENT eyes closed today NECK no jvd PULM clear bilaterally CV tachycardic, soft systolic murmur ABD distended with fluid wave today EXT no LE edema NEURO + asterixis, mitts off SKIN + spider telangectasias over chest MSK multiple ecchymoses  Imaging: No results found.  Labs: BMET  Recent Labs Lab 08/22/16 1931 08/22/16 1946 08/22/16 2138 08/22/16 2326 08/23/16 0603 08/24/16 0252 08/24/16 1848 08/25/16 0259  NA 115* 115* 114* 116* 117*  118* 120* 124* 127*  K 2.1* 2.2* 2.1* 2.3* 2.2*  2.2* 2.2* 2.8* 2.6*  CL 79* 76* 79* 80* 83*  83* 87* 91* 96*  CO2 21*  --  18* 19* 21*  22 21* 18* 18*  GLUCOSE 269* 271* 252* 202* 188*  190* 117* 170* 110*  BUN 53* 47* 54* 55* 52*  53* 61* 66* 68*  CREATININE 3.13* 3.10* 3.18* 3.24* 3.20*  3.23* 3.41* 3.01* 2.85*  CALCIUM 7.9*  --  7.8* 7.7* 7.4*  7.3* 7.2* 7.0* 7.1*   CBC  Recent Labs Lab 08/22/16 1558  08/23/16 0603 08/23/16 1154 08/24/16 0252 08/25/16 0259  WBC 39.2*  --   --  35.8* 36.8* 34.2*  NEUTROABS  --   --   --  33.3*  --   --   HGB 11.3*  < > 9.7* 9.7* 9.4* 9.1*  HCT 30.8*  < > 26.2* 26.9* 26.6* 26.5*  MCV 102.7*  --   --  103.9* 106.0* 108.2*  PLT 147*  --   --  102* 118* 134*  < > = values in this interval not displayed.  Medications:    . feeding supplement (GLUCERNA SHAKE)  237 mL Oral TID BM  . folic acid  1 mg Intravenous Daily  . insulin aspart  0-15 Units Subcutaneous Q4H  . lactulose  20 g Oral TID  . midodrine  5 mg Oral TID WC  . multivitamin with minerals  1 tablet Oral Daily  . octreotide  200 mcg Subcutaneous TID  . pantoprazole  40 mg Oral Q0600  . [START ON 08/26/2016] prednisoLONE  40 mg Oral Daily  . thiamine  100 mg Intravenous Daily      Bufford ButtnerElizabeth Rebel Willcutt, MD [facility] pgr [phone] 08/25/2016, 1:22 PM

## 2016-08-25 NOTE — Progress Notes (Signed)
Daily Rounding Note  08/25/2016, 8:18 AM  LOS: 3 days   SUBJECTIVE:   Chief complaint:  Patient isn't complaining of anything today.   Transferred to [address].  2.3 liters urine yesterday.   He remains somewhat confused and restless. Stools are brown, loose  OBJECTIVE:         Vital signs in last 24 hours:    Temp:  [97.4 F (36.3 C)-98.9 F (37.2 C)] 98.2 F (36.8 C) (06/22 0800) Pulse Rate:  [86-96] 92 (06/22 0800) Resp:  [17-29] 24 (06/22 0800) BP: (103-133)/(55-88) 121/71 (06/22 0800) SpO2:  [94 %-98 %] 96 % (06/22 0800) Weight:  [84 kg (185 lb 3 oz)] 84 kg (185 lb 3 oz) (06/21 1739) Last BM Date: 08/24/16 Filed Weights   08/22/16 1557 08/24/16 1739  Weight: 83.9 kg (185 lb) 84 kg (185 lb 3 oz)   General: Deeply jaundiced. Restless.   Heart: RRR. No MRG. Chest: Congested sounding cough. Overall clear breath sounds. Abdomen: Distended, not tender. Bowel sounds hypoactive.  Extremities: No CCE. Bruising noted from the knees down, probably from the PAS devices. Neuro/Psych:  Patient is oriented to [facility] and himself as well as the year. However when asked what month it was the answer 2016. He said he didn't know why he was here and was apprised of his liver and kidney illness.  Intake/Output from previous day: 06/21 0701 - 06/22 0700 In: 2940 [P.O.:265; I.V.:1725; IV Piggyback:950] Out: 2330 [Urine:2330]  Intake/Output this shift: Total I/O In: -  Out: 225 [Urine:225]  Lab Results:  Recent Labs  08/23/16 1154 08/24/16 0252 08/25/16 0259  WBC 35.8* 36.8* 34.2*  HGB 9.7* 9.4* 9.1*  HCT 26.9* 26.6* 26.5*  PLT 102* 118* 134*   BMET  Recent Labs  08/24/16 0252 08/24/16 1848 08/25/16 0259  NA 120* 124* 127*  K 2.2* 2.8* 2.6*  CL 87* 91* 96*  CO2 21* 18* 18*  GLUCOSE 117* 170* 110*  BUN 61* 66* 68*  CREATININE 3.41* 3.01* 2.85*  CALCIUM 7.2* 7.0* 7.1*   LFT  Recent Labs   08/22/16 1558 08/24/16 0252 08/25/16 0259  PROT 5.3* 4.8* 5.1*  ALBUMIN 1.9* 2.6* 3.1*  AST 123* 77* 85*  ALT 50 33 36  ALKPHOS 209* 111 101  BILITOT 36.6* 39.9* 19.6*  BILIDIR 23.2*  --   --   IBILI 13.4*  --   --    PT/INR  Recent Labs  08/23/16 0603 08/24/16 0252  LABPROT 21.4* 18.6*  INR 1.82 1.54   Hepatitis Panel  Recent Labs  08/22/16 1931  HEPBSAG Negative  HCVAB <0.1  HEPAIGM Negative  HEPBIGM Negative    Studies/Results: No results found.   Scheduled Meds: . feeding supplement (GLUCERNA SHAKE)  237 mL Oral TID BM  . folic acid  1 mg Intravenous Daily  . insulin aspart  0-15 Units Subcutaneous Q4H  . lactulose  300 mL Rectal Q8H  . methylPREDNISolone (SOLU-MEDROL) injection  60 mg Intravenous Q12H  . midodrine  5 mg Oral TID WC  . multivitamin with minerals  1 tablet Oral Daily  . octreotide  200 mcg Subcutaneous TID  . pantoprazole (PROTONIX) IV  40 mg Intravenous Q12H  . thiamine  100 mg Intravenous Daily   Continuous Infusions: . albumin human 25 g (08/24/16 0510)  . cefTRIAXone (ROCEPHIN)  IV Stopped (08/24/16 2215)  . potassium chloride    . potassium phosphate IVPB (mEq) 30 mEq (08/25/16  0622)   PRN Meds:.ipratropium-albuterol, LORazepam   ASSESMENT:   * ETOH hepatitis. Cirrhosis.  Chronic alcoholism. Discriminant fx 52. IV Solumedrol in place.  Improving T bil.  Coags improved. AFP normal.    * AKI. 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 48 hour Albumin challenge ends today, Midodrine, TID octreotide in place. #s improved.   Renal function not improving.    *  Leukocytosis.  Marginally improved.  Blood cultures negative to date.    * Ascites, small volume. At some point should tap to assess for SBP. Rocephin in place.   * Hyponatremia.  Improved.     * Hypokalemia. Persists.    *  Coagulopathy, improved.   * Anemia, macrocytic. FOBT positive.Remains on BID IV Protonix.   * Thrombocytopenia.   Non-critical.  Improved.    * AMS, confusion.  Elevated ammonia level (100). Lactulose in place.   * Severe PCM. Would not use TNA in pt with severe hepatitis.    * DM 2. Previously diet controlled.     PLAN   *  Continue supportive care.  Switch to oral Protonix and oral lactulose, replace Solu-Medrol with prednisolone PO. Although renal states the albumin was a 48 hour challenge, it is beyond 48 hours and it is still ordered but will let renal decide whether or not they want this to continue.  *  At some point he is going to need to undergo paracentesis, it looks like he's accumulated additional ascites over the course of these last few days.   Jennye MoccasinSarah Gribbin  08/25/2016, 8:18 AM Pager: [phone]     Attending physician's note   I have taken an interval history, reviewed the chart and examined the patient. I agree with the Advanced Practitioner's note, impression and recommendations. Pt improved overall but still confused and restless. LFTs, Cr and Na all improved. Changing to oral medications as above. Paracentesis as some point-defer timing to primary service.    Claudette HeadMalcolm Glendora Clouatre, MD [facility] GrahamFACG [phone] Mon-Fri 8a-5p [phone] after 5p, weekends, holidays

## 2016-08-26 LAB — BASIC METABOLIC PANEL
ANION GAP: 15 (ref 5–15)
BUN: 68 mg/dL — ABNORMAL HIGH (ref 6–20)
CHLORIDE: 104 mmol/L (ref 101–111)
CO2: 16 mmol/L — AB (ref 22–32)
Calcium: 7.1 mg/dL — ABNORMAL LOW (ref 8.9–10.3)
Creatinine, Ser: 2.36 mg/dL — ABNORMAL HIGH (ref 0.61–1.24)
GFR calc Af Amer: 38 mL/min — ABNORMAL LOW (ref >60)
GFR, EST NON AFRICAN AMERICAN: 33 mL/min — AB (ref >60)
Glucose, Bld: 185 mg/dL — ABNORMAL HIGH (ref 65–99)
POTASSIUM: 3.2 mmol/L — AB (ref 3.5–5.1)
Sodium: 135 mmol/L (ref 135–145)

## 2016-08-26 LAB — GLUCOSE, CAPILLARY
GLUCOSE-CAPILLARY: 178 mg/dL — AB (ref 65–99)
GLUCOSE-CAPILLARY: 118 mg/dL — AB (ref 65–99)
GLUCOSE-CAPILLARY: 225 mg/dL — AB (ref 65–99)
GLUCOSE-CAPILLARY: 100 mg/dL — AB (ref 65–99)
Glucose-Capillary: 192 mg/dL — ABNORMAL HIGH (ref 65–99)
Glucose-Capillary: 240 mg/dL — ABNORMAL HIGH (ref 65–99)
Glucose-Capillary: 223 mg/dL — ABNORMAL HIGH (ref 65–99)

## 2016-08-26 LAB — MAGNESIUM: Magnesium: 3 mg/dL — ABNORMAL HIGH (ref 1.7–2.4)

## 2016-08-26 LAB — AMMONIA: Ammonia: 158 umol/L — ABNORMAL HIGH (ref 9–35)

## 2016-08-26 MED ORDER — FOLIC ACID 1 MG PO TABS
1.0000 mg | ORAL_TABLET | Freq: Every day | ORAL | Status: DC
  Administered 2016-08-27 – 2016-09-04 (×8): 1 mg via ORAL
  Filled 2016-08-26 (×8): qty 1

## 2016-08-26 MED ORDER — HALOPERIDOL LACTATE 2 MG/ML PO CONC
1.0000 mg | Freq: Four times a day (QID) | ORAL | Status: DC | PRN
  Administered 2016-08-31 (×2): 1 mg
  Filled 2016-08-26 (×4): qty 0.5

## 2016-08-26 MED ORDER — VITAMIN B-1 100 MG PO TABS
100.0000 mg | ORAL_TABLET | Freq: Every day | ORAL | Status: DC
  Administered 2016-08-27 – 2016-09-04 (×9): 100 mg via ORAL
  Filled 2016-08-26 (×8): qty 1

## 2016-08-26 MED ORDER — POTASSIUM CHLORIDE CRYS ER 20 MEQ PO TBCR
40.0000 meq | EXTENDED_RELEASE_TABLET | Freq: Once | ORAL | Status: AC
  Administered 2016-08-26: 40 meq via ORAL
  Filled 2016-08-26: qty 2

## 2016-08-26 NOTE — Progress Notes (Signed)
[facility] TEAM 1 - Stepdown/ICU TEAM  Tyler Clarke  ONG:[id number] DOB: 1975-03-23 DOA: 08/22/2016 PCP: Kaleen Mask, MD    Brief Narrative:  41 year old M w/ hx of tobacco and heavy EtOH Abuse (24 beers/day), DM2, and Polycythemia who was sent to the ER by his PCP for abnormal labs and slurred speech.   In the ED he was noted to have significant jaundice and hypotension at 80/51, Na 114, K <2, BUN 53, Cr 3.15, albumin 1.9, TBili 36.6, WBC 39.2, plts 147, INR 1.61. Head CT was negative. EKG noted for prolonged QTc 0.540. Stool was guaiac positive.   Subjective: The patient is encephalopathic and cannot provide a reliable history.  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 - creatinine appears to be slowly improving  Recent Labs Lab 08/23/16 0603 08/24/16 0252 08/24/16 1848 08/25/16 0259 08/26/16 0238  CREATININE 3.20*  3.23* 3.41* 3.01* 2.85* 2.36*    Hyponatremia Resolved  Hypokalemia Continue to supplement and follow  Leukocytosis  Thrombocytopenia Due to cirrhosis  Prolonged QTc Monitor on telemetry  Tobacco abuse  DM 2 CBG reasonably controlled at this time  Severe protein calorie malnutrition  DVT prophylaxis: SCDs Code Status: DNR - NO CODE Family Communication: no family present at time of exam  Disposition Plan: SDU  Consultants:  PCCM Nephrology [facility]  Procedures: None  Antimicrobials:  Rocephin 6/19 > 6/22   Objective: Blood pressure 113/77, pulse 96, temperature 97.8 F (36.6 C), temperature source  Oral, resp. rate (!) 25, height 6\' 2"  (1.88 m), weight 84 kg (185 lb 3 oz), SpO2 99 %.  Intake/Output Summary (Last 24 hours) at 08/26/16 1500 Last data filed at 08/26/16 1021  Gross per 24 hour  Intake             1075 ml  Output             2075 ml  Net            -1000 ml   Filed Weights   08/22/16 1557 08/24/16 1739  Weight: 83.9 kg (185 lb) 84 kg (185 lb 3 oz)    Examination: General: No acute respiratory distress - encephalopathic  Lungs: Coarse crackles and upper airway sounds throughout all fields with no wheezing Cardiovascular: Regular rate and rhythm without murmur gallop or rub normal S1 and S2 Abdomen: Protuberant, soft, no rebound, nontender Extremities: Trace edema bilateral lower extremities  CBC:  Recent Labs Lab 08/22/16 1558  08/22/16 2138 08/23/16 0603 08/23/16 1154 08/24/16 0252 08/25/16 0259  WBC 39.2*  --   --   --  35.8* 36.8* 34.2*  NEUTROABS  --   --   --   --  33.3*  --   --   HGB 11.3*  < > 10.8* 9.7* 9.7* 9.4* 9.1*  HCT 30.8*  < > 29.8* 26.2* 26.9* 26.6* 26.5*  MCV 102.7*  --   --   --  103.9* 106.0* 108.2*  PLT 147*  --   --   --  102* 118* 134*  < > = values  in this interval not displayed. Basic Metabolic Panel:  Recent Labs Lab 08/22/16 1749  08/23/16 0603 08/23/16 1154 08/24/16 0252 08/24/16 1848 08/25/16 0259 08/25/16 1235 08/26/16 0238  NA  --   < > 117*  118*  --  120* 124* 127*  --  135  K  --   < > 2.2*  2.2*  --  2.2* 2.8* 2.6* 3.2* 3.2*  CL  --   < > 83*  83*  --  87* 91* 96*  --  104  CO2  --   < > 21*  22  --  21* 18* 18*  --  16*  GLUCOSE  --   < > 188*  190*  --  117* 170* 110*  --  185*  BUN  --   < > 52*  53*  --  61* 66* 68*  --  68*  CREATININE  --   < > 3.20*  3.23*  --  3.41* 3.01* 2.85*  --  2.36*  CALCIUM  --   < > 7.4*  7.3*  --  7.2* 7.0* 7.1*  --  7.1*  MG 2.3  --   --  2.6*  --   --  2.8* 2.9* 3.0*  < > = values in this interval not displayed. GFR: Estimated Creatinine Clearance: 48.9 mL/min  (A) (by C-G formula based on SCr of 2.36 mg/dL (H)).  Liver Function Tests:  Recent Labs Lab 08/22/16 1558 08/24/16 0252 08/25/16 0259  AST 123* 77* 85*  ALT 50 33 36  ALKPHOS 209* 111 101  BILITOT 36.6* 39.9* 19.6*  PROT 5.3* 4.8* 5.1*  ALBUMIN 1.9* 2.6* 3.1*    Recent Labs Lab 08/22/16 1749 08/22/16 1931 08/25/16 0259 08/26/16 0238  AMMONIA 72* 100* 124* 158*    Coagulation Profile:  Recent Labs Lab 08/22/16 1749 08/23/16 0603 08/24/16 0252  INR 1.61 1.82 1.54   CBG:  Recent Labs Lab 08/25/16 1932 08/25/16 2335 08/26/16 0332 08/26/16 0753 08/26/16 1131  GLUCAP 93 165* 178* 118* 192*    Recent Results (from the past 240 hour(s))  Blood culture (routine x 2)     Status: None (Preliminary result)   Collection Time: 08/22/16  5:40 PM  Result Value Ref Range Status   Specimen Description BLOOD RIGHT ANTECUBITAL  Final   Special Requests   Final    BOTTLES DRAWN AEROBIC AND ANAEROBIC Blood Culture adequate volume   Culture NO GROWTH 4 DAYS  Final   Report Status PENDING  Incomplete  Blood culture (routine x 2)     Status: None (Preliminary result)   Collection Time: 08/22/16  5:49 PM  Result Value Ref Range Status   Specimen Description BLOOD BLOOD LEFT FOREARM  Final   Special Requests   Final    BOTTLES DRAWN AEROBIC AND ANAEROBIC Blood Culture adequate volume   Culture NO GROWTH 4 DAYS  Final   Report Status PENDING  Incomplete  MRSA PCR Screening     Status: None   Collection Time: 08/22/16 11:15 PM  Result Value Ref Range Status   MRSA by PCR NEGATIVE NEGATIVE Final    Comment:        The GeneXpert MRSA Assay (FDA approved for NASAL specimens only), is one component of a comprehensive MRSA colonization surveillance program. It is not intended to diagnose MRSA infection nor to guide or monitor treatment for MRSA infections.      Scheduled Meds: . feeding supplement (GLUCERNA SHAKE)  237 mL Oral  TID BM  . [START ON 08/27/2016] folic  acid  1 mg Oral Daily  . insulin aspart  0-15 Units Subcutaneous Q4H  . lactulose  30 g Oral QID  . midodrine  5 mg Oral TID WC  . multivitamin with minerals  1 tablet Oral Daily  . octreotide  200 mcg Subcutaneous TID  . pantoprazole  40 mg Oral Q0600  . prednisoLONE  40 mg Oral Daily  . rifaximin  550 mg Oral BID  . [START ON 08/27/2016] thiamine  100 mg Oral Daily     LOS: 4 days   Lonia BloodJeffrey T. Destyne Goodreau, MD [facility] Office  [phone] Pager - Text Page per Amion as per below:  On-Call/Text Page:      [email or web address]      password [email or web address]  If 7PM-7AM, please contact night-coverage [email or web address] Password [email or web address] 08/26/2016, 3:00 PM

## 2016-08-26 NOTE — Progress Notes (Signed)
[facility] Progress Note    Assessment/ Plan:   1.  Acute kidney injury: Occurring in the setting of acute alcoholic hepatitis superimposed on decompensated cirrhosis.  Urine Na < 10; likely some component of ATN/ HRS type picture.  His urine output is picking up, and his creatinine is coming down.  Albumin stopped.  Stopping IVFs.  If he's going to get a paracentesis (as is recommended by GI), we may as well continue the octreotide and midodrine until after this is accomplished.  Because of tenuous renal status still, don't recommend more than 2L for paracentesis and replete with 25 g albumin at the time of procedure.    2.  Acute decompensated cirrhosis secondary to EtOH abuse: solumedrol --> prednisolone.  Has some HE, on lactulose and Xifaxin too.  Being empirically treated with CTX for SBP as well.  Not a transplant candidate due to heavy ongoing EtOH abuse.  GI following  3.  Possible GIB: Hgb stable.  PO PPI BID  4.  Hypokalemia and hypomagnesemia: being repleted; favor PO  5.  EtOH abuse: no signs of DTs, continuing CIWA protocol, substance abuse counseling  6.  Hyponatremia: improved  7.  Dispo: mother and I discussed palliative care c/s yesterday and she is agreeable.    Subjective:    Awake and more alert. Remains jaundiced.  Belly more distended.     Objective:   BP 113/77 (BP Location: Left Arm)   Pulse 96   Temp 97.8 F (36.6 C) (Oral)   Resp (!) 25   Ht 6\' 2"  (1.88 m)   Wt 84 kg (185 lb 3 oz)   SpO2 99%   BMI 23.78 kg/m   Intake/Output Summary (Last 24 hours) at 08/26/16 1242 Last data filed at 08/26/16 1021  Gross per 24 hour  Intake             1195 ml  Output             2075 ml  Net             -880 ml   Weight change:   Physical Exam: GEN ill appearing, lying in bed, sleeping HEENT eyes closed today NECK no jvd PULM clear bilaterally CV tachycardic, soft systolic murmur ABD distended with fluid wave today EXT no LE  edema NEURO + asterixis, mitts off SKIN + spider telangectasias over chest MSK multiple ecchymoses  Imaging: No results found.  Labs: BMET  Recent Labs Lab 08/22/16 2138 08/22/16 2326 08/23/16 0603 08/24/16 0252 08/24/16 1848 08/25/16 0259 08/25/16 1235 08/26/16 0238  NA 114* 116* 117*  118* 120* 124* 127*  --  135  K 2.1* 2.3* 2.2*  2.2* 2.2* 2.8* 2.6* 3.2* 3.2*  CL 79* 80* 83*  83* 87* 91* 96*  --  104  CO2 18* 19* 21*  22 21* 18* 18*  --  16*  GLUCOSE 252* 202* 188*  190* 117* 170* 110*  --  185*  BUN 54* 55* 52*  53* 61* 66* 68*  --  68*  CREATININE 3.18* 3.24* 3.20*  3.23* 3.41* 3.01* 2.85*  --  2.36*  CALCIUM 7.8* 7.7* 7.4*  7.3* 7.2* 7.0* 7.1*  --  7.1*   CBC  Recent Labs Lab 08/22/16 1558  08/23/16 0603 08/23/16 1154 08/24/16 0252 08/25/16 0259  WBC 39.2*  --   --  35.8* 36.8* 34.2*  NEUTROABS  --   --   --  33.3*  --   --  HGB 11.3*  < > 9.7* 9.7* 9.4* 9.1*  HCT 30.8*  < > 26.2* 26.9* 26.6* 26.5*  MCV 102.7*  --   --  103.9* 106.0* 108.2*  PLT 147*  --   --  102* 118* 134*  < > = values in this interval not displayed.  Medications:    . feeding supplement (GLUCERNA SHAKE)  237 mL Oral TID BM  . [START ON 08/27/2016] folic acid  1 mg Oral Daily  . insulin aspart  0-15 Units Subcutaneous Q4H  . lactulose  30 g Oral QID  . midodrine  5 mg Oral TID WC  . multivitamin with minerals  1 tablet Oral Daily  . octreotide  200 mcg Subcutaneous TID  . pantoprazole  40 mg Oral Q0600  . prednisoLONE  40 mg Oral Daily  . rifaximin  550 mg Oral BID  . [START ON 08/27/2016] thiamine  100 mg Oral Daily      Bufford Buttner, MD [facility] pgr [phone] 08/26/2016, 12:42 PM

## 2016-08-26 NOTE — Plan of Care (Signed)
Problem: Nutrition: Goal: Adequate nutrition will be maintained Outcome: Progressing Pt has eat a fair amount of meals today will continue to monitor

## 2016-08-27 ENCOUNTER — Inpatient Hospital Stay (HOSPITAL_COMMUNITY): Payer: Medicaid Other

## 2016-08-27 DIAGNOSIS — I255 Ischemic cardiomyopathy: Secondary | ICD-10-CM

## 2016-08-27 DIAGNOSIS — R17 Unspecified jaundice: Secondary | ICD-10-CM

## 2016-08-27 LAB — GLUCOSE, CAPILLARY
GLUCOSE-CAPILLARY: 95 mg/dL (ref 65–99)
GLUCOSE-CAPILLARY: 164 mg/dL — AB (ref 65–99)
GLUCOSE-CAPILLARY: 282 mg/dL — AB (ref 65–99)
GLUCOSE-CAPILLARY: 171 mg/dL — AB (ref 65–99)
Glucose-Capillary: 147 mg/dL — ABNORMAL HIGH (ref 65–99)
Glucose-Capillary: 176 mg/dL — ABNORMAL HIGH (ref 65–99)

## 2016-08-27 LAB — RETICULOCYTES
RBC.: 2.97 MIL/uL — AB (ref 4.22–5.81)
RETIC COUNT ABSOLUTE: 181.2 10*3/uL (ref 19.0–186.0)
Retic Ct Pct: 6.1 % — ABNORMAL HIGH (ref 0.4–3.1)

## 2016-08-27 LAB — CULTURE, BLOOD (ROUTINE X 2)
CULTURE: NO GROWTH
Culture: NO GROWTH
Special Requests: ADEQUATE
Special Requests: ADEQUATE

## 2016-08-27 LAB — COMPREHENSIVE METABOLIC PANEL
ALT: 61 U/L (ref 17–63)
AST: 139 U/L — ABNORMAL HIGH (ref 15–41)
Albumin: 2.9 g/dL — ABNORMAL LOW (ref 3.5–5.0)
Alkaline Phosphatase: 174 U/L — ABNORMAL HIGH (ref 38–126)
Anion gap: 12 (ref 5–15)
BUN: 62 mg/dL — ABNORMAL HIGH (ref 6–20)
CHLORIDE: 114 mmol/L — AB (ref 101–111)
CO2: 18 mmol/L — AB (ref 22–32)
CREATININE: 2.06 mg/dL — AB (ref 0.61–1.24)
Calcium: 7.7 mg/dL — ABNORMAL LOW (ref 8.9–10.3)
GFR, EST AFRICAN AMERICAN: 45 mL/min — AB (ref >60)
GFR, EST NON AFRICAN AMERICAN: 39 mL/min — AB (ref >60)
Glucose, Bld: 73 mg/dL (ref 65–99)
Potassium: 3 mmol/L — ABNORMAL LOW (ref 3.5–5.1)
Sodium: 144 mmol/L (ref 135–145)
Total Bilirubin: 44 mg/dL (ref 0.3–1.2)
Total Protein: 5.2 g/dL — ABNORMAL LOW (ref 6.5–8.1)

## 2016-08-27 LAB — IRON AND TIBC: IRON: 31 ug/dL — AB (ref 45–182)

## 2016-08-27 LAB — HEMOGLOBIN A1C
HEMOGLOBIN A1C: 6.9 % — AB (ref 4.8–5.6)
MEAN PLASMA GLUCOSE: 151 mg/dL

## 2016-08-27 LAB — MAGNESIUM: Magnesium: 3.1 mg/dL — ABNORMAL HIGH (ref 1.7–2.4)

## 2016-08-27 LAB — FOLATE: Folate: 13.6 ng/mL (ref >5.9)

## 2016-08-27 LAB — ECHOCARDIOGRAM COMPLETE
Height: 74 in
Weight: 2962.98 oz

## 2016-08-27 LAB — CBC
HCT: 33.5 % — ABNORMAL LOW (ref 39.0–52.0)
Hemoglobin: 11 g/dL — ABNORMAL LOW (ref 13.0–17.0)
MCH: 37 pg — AB (ref 26.0–34.0)
MCHC: 32.8 g/dL (ref 30.0–36.0)
MCV: 112.8 fL — AB (ref 78.0–100.0)
PLATELETS: 142 10*3/uL — AB (ref 150–400)
RBC: 2.97 MIL/uL — AB (ref 4.22–5.81)
RDW: 13.4 % (ref 11.5–15.5)
WBC: 34.1 10*3/uL — AB (ref 4.0–10.5)

## 2016-08-27 LAB — PROTIME-INR
INR: 1.67
Prothrombin Time: 19.9 seconds — ABNORMAL HIGH (ref 11.4–15.2)

## 2016-08-27 LAB — FERRITIN: FERRITIN: 3022 ng/mL — AB (ref 24–336)

## 2016-08-27 LAB — AMMONIA: AMMONIA: 102 umol/L — AB (ref 9–35)

## 2016-08-27 LAB — VITAMIN B12: Vitamin B-12: 6223 pg/mL — ABNORMAL HIGH (ref 180–914)

## 2016-08-27 MED ORDER — INSULIN ASPART 100 UNIT/ML ~~LOC~~ SOLN
0.0000 [IU] | Freq: Three times a day (TID) | SUBCUTANEOUS | Status: DC
  Administered 2016-08-28 (×2): 11 [IU] via SUBCUTANEOUS
  Administered 2016-08-28: 2 [IU] via SUBCUTANEOUS
  Administered 2016-08-29: 5 [IU] via SUBCUTANEOUS
  Administered 2016-08-29: 3 [IU] via SUBCUTANEOUS
  Administered 2016-08-29: 5 [IU] via SUBCUTANEOUS
  Administered 2016-08-30: 11 [IU] via SUBCUTANEOUS
  Administered 2016-08-30: 15 [IU] via SUBCUTANEOUS
  Administered 2016-08-31: 5 [IU] via SUBCUTANEOUS
  Administered 2016-08-31: 2 [IU] via SUBCUTANEOUS
  Administered 2016-09-01: 3 [IU] via SUBCUTANEOUS
  Administered 2016-09-01: 8 [IU] via SUBCUTANEOUS
  Administered 2016-09-01: 2 [IU] via SUBCUTANEOUS
  Administered 2016-09-02: 5 [IU] via SUBCUTANEOUS

## 2016-08-27 MED ORDER — POTASSIUM CHLORIDE CRYS ER 20 MEQ PO TBCR
40.0000 meq | EXTENDED_RELEASE_TABLET | Freq: Three times a day (TID) | ORAL | Status: AC
  Administered 2016-08-27 – 2016-08-28 (×3): 40 meq via ORAL
  Filled 2016-08-27 (×3): qty 2

## 2016-08-27 NOTE — Progress Notes (Addendum)
Tyler Clarke TEAM 1 - Stepdown/ICU TEAM  Tyler Clarke  WUJ:[id number] DOB: 1975-11-10 DOA: 08/22/2016 PCP: Tyler Mask, MD    Brief Narrative:  41 year old M w/ hx of tobacco and heavy EtOH Abuse (24 beers/day), DM2, and Polycythemia who was sent to the ER by his PCP for abnormal labs and slurred speech.   In the ED he was noted to have significant jaundice and hypotension at 80/51, Na 114, K <2, BUN 53, Cr 3.15, albumin 1.9, TBili 36.6, WBC 39.2, plts 147, INR 1.61. Head CT was negative. EKG noted for prolonged QTc 0.540. Stool was guaiac positive.   Subjective: Pt is sedated/obtunded at the time of my visit.  He is in no acute resp distress.  He is profoundly jaundiced.    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 - creatinine appears to be slowly improving - octreotide and midodrine stopped today - recheck in AM   Recent Labs Lab 08/24/16 0252 08/24/16 1848 08/25/16 0259 08/26/16 0238 08/27/16 0215  CREATININE 3.41* 3.01* 2.85* 2.36* 2.06*    Hyponatremia Resolved  Hypokalemia Due to GI losses w/ use of lactulose - supplement and follow   Thrombocytopenia Due to cirrhosis - appears to be slowly improving - follow   Prolonged QTc Monitor on telemetry  Tobacco abuse  DM 2 CBG variable - follow w/o change today   Severe protein calorie malnutrition  DVT prophylaxis: SCDs Code Status: DNR - NO CODE Family  Communication: no family present at time of exam  Disposition Plan: SDU  Consultants:  PCCM Nephrology [facility]  Procedures: None  Antimicrobials:  Rocephin 6/19 >   Objective: Blood pressure 107/72, pulse (!) 101, temperature 98.9 F (37.2 C), temperature source Oral, resp. rate (!) 27, height 6\' 2"  (1.88 m), weight 84 kg (185 lb 3 oz), SpO2 100 %.  Intake/Output Summary (Last 24 hours) at 08/27/16 1715 Last data filed at 08/27/16 1600  Gross per 24 hour  Intake              280 ml  Output             1325 ml  Net            -1045 ml   Filed Weights   08/22/16 1557 08/24/16 1739  Weight: 83.9 kg (185 lb) 84 kg (185 lb 3 oz)    Examination: General: No acute respiratory distress at time of my visit - remains encephalopathic  Lungs: Coarse crackles and upper airway sounds throughout all fields with signif change  Cardiovascular: RRR w/o M or rub  Abdomen: Protuberant, soft, no rebound, nontender, bs + Extremities: 1+ B LE edema   CBC:  Recent Labs Lab 08/22/16 1558  08/23/16 0603 08/23/16 1154 08/24/16 0252 08/25/16 0259 08/27/16 0215  WBC 39.2*  --   --  35.8* 36.8* 34.2* 34.1*  NEUTROABS  --   --   --  33.3*  --   --   --  HGB 11.3*  < > 9.7* 9.7* 9.4* 9.1* 11.0*  HCT 30.8*  < > 26.2* 26.9* 26.6* 26.5* 33.5*  MCV 102.7*  --   --  103.9* 106.0* 108.2* 112.8*  PLT 147*  --   --  102* 118* 134* 142*  < > = values in this interval not displayed. Basic Metabolic Panel:  Recent Labs Lab 08/23/16 1154 08/24/16 0252 08/24/16 1848 08/25/16 0259 08/25/16 1235 08/26/16 0238 08/27/16 0215  NA  --  120* 124* 127*  --  135 144  K  --  2.2* 2.8* 2.6* 3.2* 3.2* 3.0*  CL  --  87* 91* 96*  --  104 114*  CO2  --  21* 18* 18*  --  16* 18*  GLUCOSE  --  117* 170* 110*  --  185* 73  BUN  --  61* 66* 68*  --  68* 62*  CREATININE  --  3.41* 3.01* 2.85*  --  2.36* 2.06*  CALCIUM  --  7.2* 7.0* 7.1*  --  7.1* 7.7*  MG 2.6*  --   --  2.8* 2.9* 3.0* 3.1*    GFR: Estimated Creatinine Clearance: 56 mL/min (A) (by C-G formula based on SCr of 2.06 mg/dL (H)).  Liver Function Tests:  Recent Labs Lab 08/22/16 1558 08/24/16 0252 08/25/16 0259 08/27/16 0215  AST 123* 77* 85* 139*  ALT 50 33 36 61  ALKPHOS 209* 111 101 174*  BILITOT 36.6* 39.9* 19.6* 44.0*  PROT 5.3* 4.8* 5.1* 5.2*  ALBUMIN 1.9* 2.6* 3.1* 2.9*    Recent Labs Lab 08/22/16 1749 08/22/16 1931 08/25/16 0259 08/26/16 0238 08/27/16 0215  AMMONIA 72* 100* 124* 158* 102*    Coagulation Profile:  Recent Labs Lab 08/22/16 1749 08/23/16 0603 08/24/16 0252 08/27/16 1046  INR 1.61 1.82 1.54 1.67   CBG:  Recent Labs Lab 08/26/16 2316 08/27/16 0253 08/27/16 0752 08/27/16 1228 08/27/16 1635  GLUCAP 100* 95 147* 164* 282*    Recent Results (from the past 240 hour(s))  Blood culture (routine x 2)     Status: None   Collection Time: 08/22/16  5:40 PM  Result Value Ref Range Status   Specimen Description BLOOD RIGHT ANTECUBITAL  Final   Special Requests   Final    BOTTLES DRAWN AEROBIC AND ANAEROBIC Blood Culture adequate volume   Culture NO GROWTH 5 DAYS  Final   Report Status 08/27/2016 FINAL  Final  Blood culture (routine x 2)     Status: None   Collection Time: 08/22/16  5:49 PM  Result Value Ref Range Status   Specimen Description BLOOD BLOOD LEFT FOREARM  Final   Special Requests   Final    BOTTLES DRAWN AEROBIC AND ANAEROBIC Blood Culture adequate volume   Culture NO GROWTH 5 DAYS  Final   Report Status 08/27/2016 FINAL  Final  MRSA PCR Screening     Status: None   Collection Time: 08/22/16 11:15 PM  Result Value Ref Range Status   MRSA by PCR NEGATIVE NEGATIVE Final    Comment:        The GeneXpert MRSA Assay (FDA approved for NASAL specimens only), is one component of a comprehensive MRSA colonization surveillance program. It is not intended to diagnose MRSA infection nor to guide or monitor treatment for MRSA infections.       Scheduled Meds: . feeding supplement (GLUCERNA SHAKE)  237 mL Oral TID BM  . folic acid  1 mg Oral Daily  . insulin aspart  0-15 Units Subcutaneous Q4H  . lactulose  30 g Oral QID  . multivitamin with minerals  1 tablet Oral Daily  . pantoprazole  40 mg Oral Q0600  . prednisoLONE  40 mg Oral Daily  . rifaximin  550 mg Oral BID  . thiamine  100 mg Oral Daily     LOS: 5 days   Lonia BloodJeffrey T. Letroy Vazguez, MD [facility] Office  [phone] Pager - Text Page per Amion as per below:  On-Call/Text Page:      [email or web address]      password [email or web address]  If 7PM-7AM, please contact night-coverage [email or web address] Password [email or web address] 08/27/2016, 5:15 PM

## 2016-08-27 NOTE — Progress Notes (Signed)
Patient ID: Tyler Clarke, male   DOB: June 28, 1975, 41 y.o.   MRN: [id number]    Progress Note   Subjective   Nurse reports remains very agitated - just had ativan - and sleeping Frequent stools during night, none this am   Objective   Vital signs in last 24 hours: Temp:  [97.6 F (36.4 C)-98.8 F (37.1 C)] 97.6 F (36.4 C) (06/24 0752) Pulse Rate:  [95-99] 99 (06/24 0752) Resp:  [18-30] 18 (06/24 0752) BP: (113-129)/(77-92) 129/86 (06/24 0752) SpO2:  [99 %-100 %] 100 % (06/24 0250) Last BM Date: 08/26/16 General:    white male in NAD - marked jaundice - sedated  Heart: tachy Regular rate and rhythm; no murmurs Lungs: Respirations even and unlabored, lungs CTA bilaterally Abdomen:  Soft, nontender and nondistended. Normal bowel sounds., nontense ascites Extremities:  Without edema.,thin, muscle atrophy Neurologic:  sedated  Intake/Output from previous day: 06/23 0701 - 06/24 0700 In: 455 [P.O.:380; I.V.:75] Out: 1775 [Urine:1775] Intake/Output this shift: Total I/O In: -  Out: 200 [Urine:200]  Lab Results:  Recent Labs  08/25/16 0259 08/27/16 0215  WBC 34.2* 34.1*  HGB 9.1* 11.0*  HCT 26.5* 33.5*  PLT 134* 142*   BMET  Recent Labs  08/25/16 0259 08/25/16 1235 08/26/16 0238 08/27/16 0215  NA 127*  --  135 144  K 2.6* 3.2* 3.2* 3.0*  CL 96*  --  104 114*  CO2 18*  --  16* 18*  GLUCOSE 110*  --  185* 73  BUN 68*  --  68* 62*  CREATININE 2.85*  --  2.36* 2.06*  CALCIUM 7.1*  --  7.1* 7.7*   LFT  Recent Labs  08/27/16 0215  PROT 5.2*  ALBUMIN 2.9*  AST 139*  ALT 61  ALKPHOS 174*  BILITOT 44.0*   PT/INR No results for input(s): LABPROT, INR in the last 72 hours.  Studies/Results: No results found.  IMP/PLAN:  #1 41 yo WM with severe ETOH induced hepatitis/decompensated cirrhosis - Tbili 44 - on steroid RX- some improvement in parameters #2 ETOH withdrawal - continued agitation - requiring sedation #3 HRS type 1 - Cr improving - continue  Octreotide,, midodrine #4 Ascites - don't think he needs paracentesis at this time - unlikely to change management and tenuous renal status. On Rocephin since admit #5 hypokalemia - correct #6 hyponatremia - corrected  No changes today from GI standpoint, check and trend INR daily for now /PLAN  Active Problems:   Liver failure (HCC)   Alcoholic cirrhosis of liver without ascites (HCC)   Pressure injury of skin   Encephalopathy    LOS: 5 days   Amy Esterwood  08/27/2016, 10:04 AM     Attending physician's note   I have taken an interval history, reviewed the chart and examined the patient. I agree with the Advanced Practitioner's note, impression and recommendations.   Claudette HeadMalcolm Stark, MD Clement[facility] GrahamFACG [phone] Mon-Fri 8a-5p [phone] after 5p, weekends, holidays

## 2016-08-27 NOTE — Progress Notes (Signed)
CRITICAL VALUE ALERT  Critical Value: Bilirubin 44  Date & Time Notied: 24 August 21 413  Provider Notified: Dr Marijo Conceptionoypid  Orders Received/Actions taken:no new orders given

## 2016-08-27 NOTE — Plan of Care (Signed)
Problem: Safety: Goal: Ability to remain free from injury will improve Outcome: Not Progressing Pt continue to attempt to get oob multiple times a shift currently has a tele sitter bed alarm on close to nurse station

## 2016-08-27 NOTE — Progress Notes (Signed)
[facility] Progress Note    Assessment/ Plan:   1.  Acute kidney injury: Occurring in the setting of acute alcoholic hepatitis superimposed on decompensated cirrhosis.  Likely an ATN with superimposed HRS 1 picture.  No plans for paracentesis yet.  I'll stop octreotide and midodrine in that setting.  He's not a dialysis candidate.  His creatinine is improving.  If paracentesis is pursued, would do no more than 2L and would replete with 25 g albumin at time of procedure.  If cr stable off midodrine and octreotide tomorrow we will sign off.  2.  Acute decompensated cirrhosis secondary to EtOH abuse: solumedrol --> prednisolone.  Has some HE, on lactulose and Xifaxin too.  Being empirically treated with CTX for SBP as well.  Not a transplant candidate due to heavy ongoing EtOH abuse.  GI following  3.  Possible GIB: Hgb stable.  PO PPI BID  4.  Hypokalemia and hypomagnesemia: being repleted; favor PO  5.  EtOH abuse: no signs of DTs, continuing CIWA protocol, substance abuse counseling  6.  Hyponatremia: improved  7.  Dispo: ultimately pt is not significantly improving; mom is open to pall care c/s which I think is   Subjective:    Sleeping, just got ativan for CIWA of 14.  Very jaundiced.  Tbili up to 44   Objective:   BP 129/86 (BP Location: Left Arm)   Pulse 99   Temp 97.6 F (36.4 C) (Oral)   Resp 18   Ht 6\' 2"  (1.88 m)   Wt 84 kg (185 lb 3 oz)   SpO2 100%   BMI 23.78 kg/m   Intake/Output Summary (Last 24 hours) at 08/27/16 1102 Last data filed at 08/27/16 0800  Gross per 24 hour  Intake              280 ml  Output             1375 ml  Net            -1095 ml   Weight change:   Physical Exam: GEN ill appearing, lying in bed, sleeping, extremely jaundiced HEENT eyes closed today NECK no jvd PULM bilateral rhonchi CV tachycardic, soft systolic murmur ABD distended with fluid wave today EXT no LE edema NEURO + asterixis,  SKIN + spider  telangectasias over chest MSK multiple ecchymoses  Imaging: No results found.  Labs: BMET  Recent Labs Lab 08/22/16 2326 08/23/16 0603 08/24/16 0252 08/24/16 1848 08/25/16 0259 08/25/16 1235 08/26/16 0238 08/27/16 0215  NA 116* 117*  118* 120* 124* 127*  --  135 144  K 2.3* 2.2*  2.2* 2.2* 2.8* 2.6* 3.2* 3.2* 3.0*  CL 80* 83*  83* 87* 91* 96*  --  104 114*  CO2 19* 21*  22 21* 18* 18*  --  16* 18*  GLUCOSE 202* 188*  190* 117* 170* 110*  --  185* 73  BUN 55* 52*  53* 61* 66* 68*  --  68* 62*  CREATININE 3.24* 3.20*  3.23* 3.41* 3.01* 2.85*  --  2.36* 2.06*  CALCIUM 7.7* 7.4*  7.3* 7.2* 7.0* 7.1*  --  7.1* 7.7*   CBC  Recent Labs Lab 08/23/16 1154 08/24/16 0252 08/25/16 0259 08/27/16 0215  WBC 35.8* 36.8* 34.2* 34.1*  NEUTROABS 33.3*  --   --   --   HGB 9.7* 9.4* 9.1* 11.0*  HCT 26.9* 26.6* 26.5* 33.5*  MCV 103.9* 106.0* 108.2* 112.8*  PLT 102* 118* 134*  142*    Medications:    . feeding supplement (GLUCERNA SHAKE)  237 mL Oral TID BM  . folic acid  1 mg Oral Daily  . insulin aspart  0-15 Units Subcutaneous Q4H  . lactulose  30 g Oral QID  . multivitamin with minerals  1 tablet Oral Daily  . pantoprazole  40 mg Oral Q0600  . prednisoLONE  40 mg Oral Daily  . rifaximin  550 mg Oral BID  . thiamine  100 mg Oral Daily      Bufford ButtnerElizabeth Annai Heick, MD [facility] pgr [phone] 08/27/2016, 11:02 AM

## 2016-08-27 NOTE — Progress Notes (Addendum)
Echocardiogram 2D Echocardiogram has been performed. Difficult exam due to poor patient cooperation. Limited images obtained.  Dorothey BasemanReel, Sony Schlarb M 08/27/2016, 9:31 AM

## 2016-08-28 DIAGNOSIS — K7010 Alcoholic hepatitis without ascites: Secondary | ICD-10-CM

## 2016-08-28 DIAGNOSIS — K7682 Hepatic encephalopathy: Secondary | ICD-10-CM

## 2016-08-28 DIAGNOSIS — K7290 Hepatic failure, unspecified without coma: Secondary | ICD-10-CM

## 2016-08-28 LAB — GLUCOSE, CAPILLARY
GLUCOSE-CAPILLARY: 326 mg/dL — AB (ref 65–99)
GLUCOSE-CAPILLARY: 332 mg/dL — AB (ref 65–99)
GLUCOSE-CAPILLARY: 180 mg/dL — AB (ref 65–99)
Glucose-Capillary: 148 mg/dL — ABNORMAL HIGH (ref 65–99)

## 2016-08-28 LAB — CBC
HEMATOCRIT: 33.9 % — AB (ref 39.0–52.0)
HEMOGLOBIN: 11.1 g/dL — AB (ref 13.0–17.0)
MCH: 37.8 pg — ABNORMAL HIGH (ref 26.0–34.0)
MCHC: 32.7 g/dL (ref 30.0–36.0)
MCV: 115.3 fL — AB (ref 78.0–100.0)
Platelets: 126 10*3/uL — ABNORMAL LOW (ref 150–400)
RBC: 2.94 MIL/uL — ABNORMAL LOW (ref 4.22–5.81)
RDW: 13.7 % (ref 11.5–15.5)
WBC: 30.9 10*3/uL — AB (ref 4.0–10.5)

## 2016-08-28 LAB — COMPREHENSIVE METABOLIC PANEL
ALBUMIN: 2.7 g/dL — AB (ref 3.5–5.0)
ALT: 73 U/L — ABNORMAL HIGH (ref 17–63)
AST: 125 U/L — AB (ref 15–41)
Alkaline Phosphatase: 223 U/L — ABNORMAL HIGH (ref 38–126)
Anion gap: 12 (ref 5–15)
BUN: 57 mg/dL — AB (ref 6–20)
CHLORIDE: 121 mmol/L — AB (ref 101–111)
CO2: 19 mmol/L — ABNORMAL LOW (ref 22–32)
Calcium: 8.1 mg/dL — ABNORMAL LOW (ref 8.9–10.3)
Creatinine, Ser: 2.05 mg/dL — ABNORMAL HIGH (ref 0.61–1.24)
GFR calc Af Amer: 45 mL/min — ABNORMAL LOW (ref >60)
GFR, EST NON AFRICAN AMERICAN: 39 mL/min — AB (ref >60)
Glucose, Bld: 294 mg/dL — ABNORMAL HIGH (ref 65–99)
POTASSIUM: 3.8 mmol/L (ref 3.5–5.1)
Sodium: 152 mmol/L — ABNORMAL HIGH (ref 135–145)
TOTAL PROTEIN: 5.1 g/dL — AB (ref 6.5–8.1)
Total Bilirubin: 43.7 mg/dL (ref 0.3–1.2)

## 2016-08-28 LAB — PROTIME-INR
INR: 1.74
PROTHROMBIN TIME: 20.5 s — AB (ref 11.4–15.2)

## 2016-08-28 LAB — PHOSPHORUS: Phosphorus: 4.9 mg/dL — ABNORMAL HIGH (ref 2.5–4.6)

## 2016-08-28 LAB — AMMONIA: Ammonia: 61 umol/L — ABNORMAL HIGH (ref 9–35)

## 2016-08-28 MED ORDER — INSULIN GLARGINE 100 UNIT/ML ~~LOC~~ SOLN
8.0000 [IU] | Freq: Every day | SUBCUTANEOUS | Status: DC
Start: 2016-08-28 — End: 2016-08-30
  Administered 2016-08-28 – 2016-08-30 (×3): 8 [IU] via SUBCUTANEOUS
  Filled 2016-08-28 (×3): qty 0.08

## 2016-08-28 MED ORDER — SODIUM CHLORIDE 0.45 % IV SOLN
INTRAVENOUS | Status: DC
  Administered 2016-08-28 – 2016-08-29 (×3): via INTRAVENOUS

## 2016-08-28 MED ORDER — LACTULOSE 10 GM/15ML PO SOLN
20.0000 g | Freq: Two times a day (BID) | ORAL | Status: DC
  Administered 2016-08-28 – 2016-09-06 (×17): 20 g via ORAL
  Filled 2016-08-28 (×17): qty 30

## 2016-08-28 NOTE — Progress Notes (Signed)
Daily Rounding Note  08/28/2016, 9:18 AM  LOS: 6 days   SUBJECTIVE:   Chief complaint:  None today   Remains confused and agitated at times.  Frequent loose, non-bloody stools. Pt's meals and PO meds held last night due to AMS to out-of -it to take PO safely, this is better this AM.  OBJECTIVE:         Vital signs in last 24 hours:    Temp:  [97.8 F (36.6 C)-98.9 F (37.2 C)] 97.8 F (36.6 C) (06/25 0734) Pulse Rate:  [97-102] 102 (06/25 0734) Resp:  [18-27] 24 (06/25 0734) BP: (102-135)/(72-89) 102/73 (06/25 0734) SpO2:  [94 %-100 %] 99 % (06/25 0734) Weight:  [84 kg (185 lb 3 oz)] 84 kg (185 lb 3 oz) (06/25 0220) Last BM Date: 08/27/16 Filed Weights   08/22/16 1557 08/24/16 1739 08/28/16 0220  Weight: 83.9 kg (185 lb) 84 kg (185 lb 3 oz) 84 kg (185 lb 3 oz)   General: markedly jaundiced and icteric   Heart: tachy, regular Chest: upper exp wheezes, loose cough, bil ronchi.  No labored breathing.   Abdomen: softer, less protuberant/distended.  Hypoactive BS.  NT  Extremities: muscle wasting of all limbs Heme: large ecchymosis on left post thorax and on lower legs (where PAS hose were placed) Neuro/Psych:  Follows commands, speech a bit garbled but answers appropriately.  Oriented to  [facility], [facility], self but not as to his current health issues.  Some upper extremity tremor (improved) but no discernable tremor.  Mittens in place.  Moves all 4 limbs.    Intake/Output from previous day: 06/24 0701 - 06/25 0700 In: 150 [IV Piggyback:150] Out: 1150 [Urine:1150]  Intake/Output this shift: No intake/output data recorded.  Lab Results:  Recent Labs  08/27/16 0215 08/28/16 0224  WBC 34.1* 30.9*  HGB 11.0* 11.1*  HCT 33.5* 33.9*  PLT 142* 126*   BMET  Recent Labs  08/26/16 0238 08/27/16 0215 08/28/16 0224  NA 135 144 152*  K 3.2* 3.0* 3.8  CL 104 114* 121*  CO2 16* 18* 19*  GLUCOSE 185* 73 294*    BUN 68* 62* 57*  CREATININE 2.36* 2.06* 2.05*  CALCIUM 7.1* 7.7* 8.1*   LFT  Recent Labs  08/27/16 0215 08/28/16 0224  PROT 5.2* 5.1*  ALBUMIN 2.9* 2.7*  AST 139* 125*  ALT 61 73*  ALKPHOS 174* 223*  BILITOT 44.0* 43.7*   PT/INR  Recent Labs  08/27/16 1046 08/28/16 0224  LABPROT 19.9* 20.5*  INR 1.67 1.74   Hepatitis Panel No results for input(s): HEPBSAG, HCVAB, HEPAIGM, HEPBIGM in the last 72 hours.  Studies/Results: No results found.   Scheduled Meds: . feeding supplement (GLUCERNA SHAKE)  237 mL Oral TID BM  . folic acid  1 mg Oral Daily  . insulin aspart  0-15 Units Subcutaneous TID WC  . lactulose  30 g Oral QID  . multivitamin with minerals  1 tablet Oral Daily  . pantoprazole  40 mg Oral Q0600  . potassium chloride  40 mEq Oral TID  . prednisoLONE  40 mg Oral Daily  . rifaximin  550 mg Oral BID  . thiamine  100 mg Oral Daily   Continuous Infusions: . cefTRIAXone (ROCEPHIN)  IV Stopped (08/27/16 2130)   PRN Meds:.haloperidol, ipratropium-albuterol, LORazepam   ASSESMENT:   *  Severe ETOH hepatitis, decompensated cirrhosis.  Day 7 steroids Solumedrol >. Prednisolone.  T bili rising, fluctuating alk phos  and transaminases.  WBCs declining, still markedly elevated.     *  HRS type 1.  Octreotide, midodrine stopped 6/24. Completed 48 hours of Albumin Rx.    *  HE.  On lactulose, Rifaxamin.    *  Ascites.  Holding off on Paracentesis due to tenuous renal function.  Empiric Rocephin day 7.  *  Hypernatremia.       *  Non-critical thrombocytopenia.    *  Coagulopathy.  PT elevated, INR WNL.     PLAN   *  Supportive care.  Dropped the dose on Lactulose. Goal is 3 to 4 BMs daily and has been well above this of late.  ? Duration of Rocephin.  No + micro data, but WBCs still quite elevated though not necessarily due to infection.   Tyler Clarke  08/28/2016, 9:18 AM Pager: [phone]  GI ATTENDING  Case discussed with Dr. Fuller Plan. Interval  history and data reviewed. Patient seen and examined. Agree with interval progress note. Patient remains seriously ill with acute on chronic liver disease. Would not expect bilirubin to follow in the face of significant renal insufficiency. Continue with all supportive measures. Adjustment lactulose. No additional GI plans or recommendations.  Docia Chuck. Geri Seminole., M.D. [facility] Division of Gastroenterology

## 2016-08-28 NOTE — Progress Notes (Signed)
Dr. Sharon SellerMcClung at bedside. Assessed patient, discussed condition and plan of care with patient's mother.

## 2016-08-28 NOTE — Clinical Social Work Note (Signed)
Patient continues to follow for improved mentation prior to presenting patient with substance abuse resources. Patient oriented to self only.  Tyler CourtSarah Akua Blethen, CSW [phone]

## 2016-08-28 NOTE — Progress Notes (Signed)
Assessment/ Plan:   1. Acute kidney injury: Occurring in the setting of acute alcoholic hepatitis superimposed on decompensated cirrhosis.  He is dehydrated currently  2. Acute decompensated cirrhosis secondary to EtOH abuse: solumedrol --> prednisolone. Has some HE, on lactulose and Xifaxin too. Being empirically treated with CTX for SBP as well. Not a transplant candidate due to heavy ongoing EtOH abuse.  GI following  3. Possible GIB: Hgb stable.  PO PPI BID  4. Hypokalemia and hypomagnesemia: being repleted; favor PO  5. EtOH abuse: no signs of DTs, continuing CIWA protocol, substance abuse counseling  6. Hyponatremia: improved  7.  Hypernatremia: Due to GI losses, decreased intake.  Will give IVFs   Subjective: Interval History: Sodium is high  Objective: Vital signs in last 24 hours: Temp:  [97.5 F (36.4 C)-98.9 F (37.2 C)] 97.5 F (36.4 C) (06/25 1112) Pulse Rate:  [97-107] 102 (06/25 1200) Resp:  [18-30] 29 (06/25 1200) BP: (102-135)/(62-89) 104/62 (06/25 1112) SpO2:  [94 %-100 %] 100 % (06/25 1200) Weight:  [84 kg (185 lb 3 oz)] 84 kg (185 lb 3 oz) (06/25 0220) Weight change:   Intake/Output from previous day: 06/24 0701 - 06/25 0700 In: 150 [IV Piggyback:150] Out: 1150 [Urine:1150] Intake/Output this shift: Total I/O In: 520 [P.O.:520] Out: 501 [Urine:500; Stool:1]  General appearance: alert and icteric Extremities: reduced skin turgor  Lab Results:  Recent Labs  08/27/16 0215 08/28/16 0224  WBC 34.1* 30.9*  HGB 11.0* 11.1*  HCT 33.5* 33.9*  PLT 142* 126*   BMET:  Recent Labs  08/27/16 0215 08/28/16 0224  NA 144 152*  K 3.0* 3.8  CL 114* 121*  CO2 18* 19*  GLUCOSE 73 294*  BUN 62* 57*  CREATININE 2.06* 2.05*  CALCIUM 7.7* 8.1*   No results for input(s): PTH in the last 72 hours. Iron Studies:  Recent Labs  08/27/16 0215  IRON 31*  TIBC NOT CALCULATED  FERRITIN 3,022*   Studies/Results: No results  found.  Scheduled: . feeding supplement (GLUCERNA SHAKE)  237 mL Oral TID BM  . folic acid  1 mg Oral Daily  . insulin aspart  0-15 Units Subcutaneous TID WC  . insulin glargine  8 Units Subcutaneous Daily  . lactulose  20 g Oral BID  . multivitamin with minerals  1 tablet Oral Daily  . pantoprazole  40 mg Oral Q0600  . prednisoLONE  40 mg Oral Daily  . rifaximin  550 mg Oral BID  . thiamine  100 mg Oral Daily    LOS: 6 days   Jaelene Garciagarcia C 08/28/2016,2:46 PM

## 2016-08-28 NOTE — Progress Notes (Signed)
[facility] TEAM 1 - Stepdown/ICU TEAM  [name] E Ruland  ZOX:[id number] DOB: [birth year] DOA: 08/22/2016 PCP: Kaleen MaskElkins, Wilson Oliver, MD    Brief Narrative:  41 year old M w/ hx of tobacco and heavy EtOH Abuse (24 beers/day), DM2, and Polycythemia who was sent to the ER by his PCP for abnormal labs and slurred speech.   In the ED he was noted to have significant jaundice and hypotension at 80/51, Na 114, K <2, BUN 53, Cr 3.15, albumin 1.9, TBili 36.6, WBC 39.2, plts 147, INR 1.61. Head CT was negative. EKG noted for prolonged QTc 0.540. Stool was guaiac positive.   Subjective: The patient is alert but encephalopathic/confused.  He cannot provide a reliable history.  He does not appear to be in acute respiratory distress nor does he appear to be suffering with uncontrolled pain.  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 - creatinine appears to be slowly improving - octreotide and midodrine stopped 6/24 - renal function slowly improving at this time  Recent Labs Lab 08/24/16 1848 08/25/16 0259 08/26/16 0238 08/27/16 0215 08/28/16 0224  CREATININE 3.01* 2.85* 2.36* 2.06* 2.05*    Hyponatremia Resolved  Hypokalemia Due to GI losses w/ use of lactulose - Resolved with supplementation  Thrombocytopenia Due to cirrhosis - stable at present without evidence of bleeding  Prolonged QTc Monitor on telemetry  Tobacco  abuse  DM 2 CBG trending upward today - adjust treatment and follow  Severe protein calorie malnutrition  DVT prophylaxis: SCDs Code Status: DNR - NO CODE Family Communication: Spoke with mother at bedside at length - informed her that I remained concerned that he was not likely to survive this hospital stay Disposition Plan: SDU  Consultants:  PCCM Nephrology [facility]  Procedures: None  Antimicrobials:  Rocephin 6/19 >   Objective: Blood pressure 102/73, pulse (!) 102, temperature 97.8 F (36.6 C), temperature source Oral, resp. rate (!) 24, height 6\' 2"  (1.88 m), weight 84 kg (185 lb 3 oz), SpO2 99 %.  Intake/Output Summary (Last 24 hours) at 08/28/16 1201 Last data filed at 08/28/16 0900  Gross per 24 hour  Intake              430 ml  Output             1251 ml  Net             -821 ml   Filed Weights   08/22/16 1557 08/24/16 1739 08/28/16 0220  Weight: 83.9 kg (185 lb) 84 kg (185 lb 3 oz) 84 kg (185 lb 3 oz)    Examination: General: More alert but confused and unable to provide history Lungs: Coarse crackles and upper airway sounds persisted all fields Cardiovascular: RRR w/o M Abdomen: Protuberant, soft, no rebound, nontender, bs +, obvious ascites slowly increasing Extremities: 1+ B LE edema without significant change   CBC:  Recent Labs Lab 08/23/16 1154 08/24/16 0252 08/25/16 0259 08/27/16 0215 08/28/16  0224  WBC 35.8* 36.8* 34.2* 34.1* 30.9*  NEUTROABS 33.3*  --   --   --   --   HGB 9.7* 9.4* 9.1* 11.0* 11.1*  HCT 26.9* 26.6* 26.5* 33.5* 33.9*  MCV 103.9* 106.0* 108.2* 112.8* 115.3*  PLT 102* 118* 134* 142* 126*   Basic Metabolic Panel:  Recent Labs Lab 08/23/16 1154  08/24/16 1848 08/25/16 0259 08/25/16 1235 08/26/16 0238 08/27/16 0215 08/28/16 0224  NA  --   < > 124* 127*  --  135 144 152*  K  --   < > 2.8* 2.6* 3.2* 3.2* 3.0* 3.8  CL  --   < > 91* 96*  --  104 114* 121*  CO2  --   < > 18* 18*  --  16* 18* 19*  GLUCOSE  --   <  > 170* 110*  --  185* 73 294*  BUN  --   < > 66* 68*  --  68* 62* 57*  CREATININE  --   < > 3.01* 2.85*  --  2.36* 2.06* 2.05*  CALCIUM  --   < > 7.0* 7.1*  --  7.1* 7.7* 8.1*  MG 2.6*  --   --  2.8* 2.9* 3.0* 3.1*  --   PHOS  --   --   --   --   --   --   --  4.9*  < > = values in this interval not displayed. GFR: Estimated Creatinine Clearance: 56.2 mL/min (A) (by C-G formula based on SCr of 2.05 mg/dL (H)).  Liver Function Tests:  Recent Labs Lab 08/22/16 1558 08/24/16 0252 08/25/16 0259 08/27/16 0215 08/28/16 0224  AST 123* 77* 85* 139* 125*  ALT 50 33 36 61 73*  ALKPHOS 209* 111 101 174* 223*  BILITOT 36.6* 39.9* 19.6* 44.0* 43.7*  PROT 5.3* 4.8* 5.1* 5.2* 5.1*  ALBUMIN 1.9* 2.6* 3.1* 2.9* 2.7*    Recent Labs Lab 08/22/16 1931 08/25/16 0259 08/26/16 0238 08/27/16 0215 08/28/16 0224  AMMONIA 100* 124* 158* 102* 61*    Coagulation Profile:  Recent Labs Lab 08/22/16 1749 08/23/16 0603 08/24/16 0252 08/27/16 1046 08/28/16 0224  INR 1.61 1.82 1.54 1.67 1.74   CBG:  Recent Labs Lab 08/27/16 1228 08/27/16 1635 08/27/16 1915 08/27/16 2113 08/28/16 0735  GLUCAP 164* 282* 171* 176* 326*    Recent Results (from the past 240 hour(s))  Blood culture (routine x 2)     Status: None   Collection Time: 08/22/16  5:40 PM  Result Value Ref Range Status   Specimen Description BLOOD RIGHT ANTECUBITAL  Final   Special Requests   Final    BOTTLES DRAWN AEROBIC AND ANAEROBIC Blood Culture adequate volume   Culture NO GROWTH 5 DAYS  Final   Report Status 08/27/2016 FINAL  Final  Blood culture (routine x 2)     Status: None   Collection Time: 08/22/16  5:49 PM  Result Value Ref Range Status   Specimen Description BLOOD BLOOD LEFT FOREARM  Final   Special Requests   Final    BOTTLES DRAWN AEROBIC AND ANAEROBIC Blood Culture adequate volume   Culture NO GROWTH 5 DAYS  Final   Report Status 08/27/2016 FINAL  Final  MRSA PCR Screening     Status: None   Collection  Time: 08/22/16 11:15 PM  Result Value Ref Range Status   MRSA by PCR NEGATIVE NEGATIVE Final    Comment:        The GeneXpert  MRSA Assay (FDA approved for NASAL specimens only), is one component of a comprehensive MRSA colonization surveillance program. It is not intended to diagnose MRSA infection nor to guide or monitor treatment for MRSA infections.      Scheduled Meds: . feeding supplement (GLUCERNA SHAKE)  237 mL Oral TID BM  . folic acid  1 mg Oral Daily  . insulin aspart  0-15 Units Subcutaneous TID WC  . lactulose  20 g Oral BID  . multivitamin with minerals  1 tablet Oral Daily  . pantoprazole  40 mg Oral Q0600  . prednisoLONE  40 mg Oral Daily  . rifaximin  550 mg Oral BID  . thiamine  100 mg Oral Daily     LOS: 6 days   Lonia Blood, MD [facility] Office  [phone] Pager - Text Page per Amion as per below:  On-Call/Text Page:      [email or web address]      password [email or web address]  If 7PM-7AM, please contact night-coverage [email or web address] Password [email or web address] 08/28/2016, 12:01 PM

## 2016-08-29 DIAGNOSIS — E870 Hyperosmolality and hypernatremia: Secondary | ICD-10-CM

## 2016-08-29 DIAGNOSIS — D696 Thrombocytopenia, unspecified: Secondary | ICD-10-CM

## 2016-08-29 DIAGNOSIS — F10121 Alcohol abuse with intoxication delirium: Secondary | ICD-10-CM

## 2016-08-29 DIAGNOSIS — E118 Type 2 diabetes mellitus with unspecified complications: Secondary | ICD-10-CM

## 2016-08-29 DIAGNOSIS — N170 Acute kidney failure with tubular necrosis: Secondary | ICD-10-CM

## 2016-08-29 DIAGNOSIS — K7040 Alcoholic hepatic failure without coma: Principal | ICD-10-CM

## 2016-08-29 LAB — GLUCOSE, CAPILLARY
GLUCOSE-CAPILLARY: 203 mg/dL — AB (ref 65–99)
Glucose-Capillary: 184 mg/dL — ABNORMAL HIGH (ref 65–99)
Glucose-Capillary: 243 mg/dL — ABNORMAL HIGH (ref 65–99)
Glucose-Capillary: 267 mg/dL — ABNORMAL HIGH (ref 65–99)

## 2016-08-29 LAB — COMPREHENSIVE METABOLIC PANEL
ALBUMIN: 2.7 g/dL — AB (ref 3.5–5.0)
ALK PHOS: 245 U/L — AB (ref 38–126)
ALT: 88 U/L — ABNORMAL HIGH (ref 17–63)
ANION GAP: 11 (ref 5–15)
AST: 145 U/L — AB (ref 15–41)
BUN: 57 mg/dL — AB (ref 6–20)
CALCIUM: 8.3 mg/dL — AB (ref 8.9–10.3)
CO2: 17 mmol/L — AB (ref 22–32)
Chloride: 125 mmol/L — ABNORMAL HIGH (ref 101–111)
Creatinine, Ser: 2 mg/dL — ABNORMAL HIGH (ref 0.61–1.24)
GFR calc Af Amer: 47 mL/min — ABNORMAL LOW (ref >60)
GFR calc non Af Amer: 40 mL/min — ABNORMAL LOW (ref >60)
GLUCOSE: 215 mg/dL — AB (ref 65–99)
POTASSIUM: 4 mmol/L (ref 3.5–5.1)
SODIUM: 153 mmol/L — AB (ref 135–145)
TOTAL PROTEIN: 5 g/dL — AB (ref 6.5–8.1)
Total Bilirubin: 47.3 mg/dL (ref 0.3–1.2)

## 2016-08-29 LAB — AMMONIA: Ammonia: 37 umol/L — ABNORMAL HIGH (ref 9–35)

## 2016-08-29 MED ORDER — DEXTROSE 5 % IV SOLN
INTRAVENOUS | Status: DC
  Administered 2016-08-29 – 2016-08-30 (×3): via INTRAVENOUS

## 2016-08-29 MED ORDER — FREE WATER
200.0000 mL | Freq: Three times a day (TID) | Status: DC
  Administered 2016-08-29 – 2016-08-30 (×4): 200 mL via ORAL

## 2016-08-29 NOTE — Progress Notes (Signed)
Patient unable to void bladder scanned for 815 ml urine. MD notified, I & O cathed for 375. Patient tolerated fair, patient resting.

## 2016-08-29 NOTE — Progress Notes (Signed)
Daily Rounding Note  08/29/2016, 8:34 AM  LOS: 7 days   SUBJECTIVE:   Chief complaint: neck and back pain, not new issues for him.    2 soft, brown BMs in last 8 hours. Eating 10 to 25% of meals.   RN requesting replacement of urinary catheter, previous Coude catheter was removed and pt having difficulty urinating.  Staff resorting to periodic I/O catheterization.   OBJECTIVE:         Vital signs in last 24 hours:    Temp:  [97.5 F (36.4 C)-98.7 F (37.1 C)] 97.6 F (36.4 C) (06/26 0732) Pulse Rate:  [94-107] 96 (06/26 0732) Resp:  [18-30] 20 (06/26 0732) BP: (101-133)/(62-90) 101/69 (06/26 0732) SpO2:  [99 %-100 %] 100 % (06/26 0732) Last BM Date: 08/27/16 Filed Weights   08/22/16 1557 08/24/16 1739 08/28/16 0220  Weight: 83.9 kg (185 lb) 84 kg (185 lb 3 oz) 84 kg (185 lb 3 oz)   General: jaundiced, ill, malnourished looking   Heart: RRR Chest: sputum in upper airway with weak cough.  Exp wheezing. Abdomen: distended/protuberant.  NT.  BS present but hypoactive.    Extremities: no CCE.  Generalized muscle wasting.  Bruising on legs, arms, trunk. Neuro/Psych:  Oriented to himself, [facility],  but not to year.  Randomly utters or responds with non-sequitors.  Restless leg type movement.  Unable to elicit asterixis in hands.  Not able to move eyes to follow finger and complete EOMI assessment.    Intake/Output from previous day: 06/25 0701 - 06/26 0700 In: 3185 [P.O.:920; I.V.:2250] Out: 1251 [Urine:1250; Stool:1]  Intake/Output this shift: No intake/output data recorded.  Lab Results:  Recent Labs  08/27/16 0215 08/28/16 0224  WBC 34.1* 30.9*  HGB 11.0* 11.1*  HCT 33.5* 33.9*  PLT 142* 126*   BMET  Recent Labs  08/27/16 0215 08/28/16 0224 08/29/16 0324  NA 144 152* 153*  K 3.0* 3.8 4.0  CL 114* 121* 125*  CO2 18* 19* 17*  GLUCOSE 73 294* 215*  BUN 62* 57* 57*  CREATININE 2.06* 2.05* 2.00*    CALCIUM 7.7* 8.1* 8.3*   LFT  Recent Labs  08/27/16 0215 08/28/16 0224 08/29/16 0324  PROT 5.2* 5.1* 5.0*  ALBUMIN 2.9* 2.7* 2.7*  AST 139* 125* 145*  ALT 61 73* 88*  ALKPHOS 174* 223* 245*  BILITOT 44.0* 43.7* 47.3*   PT/INR  Recent Labs  08/27/16 1046 08/28/16 0224  LABPROT 19.9* 20.5*  INR 1.67 1.74   Hepatitis Panel No results for input(s): HEPBSAG, HCVAB, HEPAIGM, HEPBIGM in the last 72 hours.  Studies/Results: No results found.   Scheduled Meds: . feeding supplement (GLUCERNA SHAKE)  237 mL Oral TID BM  . folic acid  1 mg Oral Daily  . insulin aspart  0-15 Units Subcutaneous TID WC  . insulin glargine  8 Units Subcutaneous Daily  . lactulose  20 g Oral BID  . multivitamin with minerals  1 tablet Oral Daily  . pantoprazole  40 mg Oral Q0600  . prednisoLONE  40 mg Oral Daily  . rifaximin  550 mg Oral BID  . thiamine  100 mg Oral Daily   Continuous Infusions: . sodium chloride 150 mL/hr at 08/29/16 0506   PRN Meds:.haloperidol, ipratropium-albuterol, LORazepam   ASSESMENT:   *  Severe ETOH hepatitis, decompensated cirrhosis.  Day 8 steroids Solumedrol >. Prednisolone.  LFTs persistently elevated,  WBCs declining, still markedly elevated.     *  HRS type 1 /AKI: improved.  Completed Octreotide, midodrine, Albumin.    *  HE.  On lactulose, Rifaxamin.    *  Ascites.  Holding off on Paracentesis due to tenuous renal function. Completed 7 days empiric Rocephin .  *  Hypernatremia.  on NS.     *  Non-critical thrombocytopenia.    *  Coagulopathy.  PT elevated, INR WNL.      PLAN   *  Supportive care.    * If contd improvement in renal fx, consider paracentesis  Jennye MoccasinSarah Gribbin  08/29/2016, 8:34 AM Pager: [phone]  GI ATTENDING  Interval history data reviewed. Patient seen and examined. Agree with interval progress note. The patient remains quite ill stable. Continue current treatment plan with no new recommendations from GI perspective.  Hopefully he will improve with time without interval setbacks.  Wilhemina BonitoJohn N. Eda KeysPerry, Jr., M.D. [facility] Division of Gastroenterology

## 2016-08-29 NOTE — Progress Notes (Signed)
PROGRESS NOTE    Tyler Clarke  JYN:[id number] DOB: Aug 08, 1975 DOA: 08/22/2016 PCP: Kaleen MaskElkins, Wilson Oliver, MD   Brief Narrative:  41 year old WM PMHx Current smoker, heavy EtOH Abuse, DM type 2,Polycythemia  Who was sent to the ER today by his primary MD for abnormal labs and slurred speech that started on Wednesday.    Patient reports vaguely not to be feeling well for the last several weeks and reports a yellowish tint to his skin that started several days ago.  He additionally admits to recent falls at home.  Denies any abdominal pain, cough, shortness of breath, fever, chills, nausea, vomiting, diarrhea, bloody stools, weight gain, swelling, or dizziness.  He reports he currently smokes half to one pack of cigarettes a day, drinks 24 beers daily (last drink yesterday), and denies any recreational drug use.  Reports his diabetes was previously diet controlled.    In the ED, he was noted to have significant jaundice and hypotension of 80/51, heart rate 91,afebrile 97.3, and 96% on room air. He has been alert and oriented.  Labs noted for Na 114, K < 2, Cl 20, glucose 304, BUN 53, sCr 3.15 (previously 0.4 on 07/27/16), AG 21, albumin 1.9, AST 123, t. Bili 36.6,  WBC 39.2, Hgb 11.3, Hct 30.8, plts 147, INR 1.61, PT 19.3, other labs pending.  Head CT for recurrent falls was negative. EKG noted for prolonged QTc 0.540.  Stool was guaiac positive.  He was treated with 1L NS, 1 L LR, started on KCL and mag replacement, protonix bolus and drip started, and placed on octreotide.  Systolic blood pressure remains in the 90's.  PCCM to admit to ICU for hepatic and renal dysfunction.     Subjective: 6/26  A/O 1 (does not know when, where, why), follows commands. 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  Lab Results  Component Value Date   CREATININE 2.00 (H) 08/29/2016   CREATININE 2.05 (H) 08/28/2016   CREATININE 2.06 (H) 08/27/2016  -Avoid nephrotoxic agents  Hypernatremia - D5W @  175 ml/hr  Hypokalemia -Resolved continue to monitor  Hypomagnesium -Monitor closely  Leukocytosis - ddx includeacute hepatitis vs SBP -Trend WBC  Recent Labs Lab 08/22/16 1558 08/23/16 1154 08/24/16 0252 08/25/16 0259 08/27/16 0215 08/28/16 0224  WBC 39.2* 35.8* 36.8* 34.2* 34.1* 30.9*  -Afebrile last 24 hour's -Continue ceftriaxone for SBP prophylaxis, dose adjusted 6/20  Alcohol abuse(24 beers daily),/Acute alcoholic hepatitis with ascites - MELD score 36  -Ativan PRN -Monitor neuro status  -Continue daily MVI/ folate/ thiamine  Severe protein calorie malnutrition -Encourage patient to continue to take by mouth food/drink  Alcoholic Hepatic cirrhosis with ascites -Abdominal ultrasound consistent with hepatic cirrhosis: Minimal ascites -Pending AFP, Acute hepatitis panel negative -Prednisolone 40 mg daily -Protonix 40 mg daily  Hepatic Encephalopathy   Recent Labs Lab 08/22/16 1931 08/25/16 0259 08/26/16 0238 08/27/16 0215 08/28/16 0224 08/29/16 0324  AMMONIA 100* 124* 158* 102* 61* 37*  -Increase lactulose 20 g BID -Rifaximin 550 mg BID  Anemia  - mild   Thrombocytopenia- -Negative sign of overt bleeding continue to monitor closely  Prolonged QTc -Goal map > 65 -Avoid meds that prolong QTc  -Echocardiogram pending  Tobacco abuse - CXR normal -O2 prn for sats > 94% -Pulmonary hygiene with IS  -Duonebs q4 hr prn  -Smoking cessation   DM type II controlled with complication - cortisol 39.6 -6/22 Hemoglobin A1c = 6.9  -Lipid panel -Lantus 8 units daily -Moderate SSI         DVT prophylaxis: SCD Code Status: DO NOT RESUSCITATE Family  Communication: None  Disposition Plan: TBD   Consultants:  [facility] Dr. Claudette Head Nephrology Dr. Bufford Buttner [facility]   Procedures/Significant Events:  6/19 Heat CT >> normal 6/19 [place] abd >> small volume ascites, nodular contour of liver c/w hepatic cirrhosis; 1.4 cm cyst at the left hepatic lobe; gallbladder wall thickening is nonspecific in the presence of ascites, underlying gallbladder sludge, no definite stones 6/20 Renal US >> 6/24 Echocardiogram: LVEF=  65% to 70%. - (grade 1 diastolic dysfunction).   VENTILATOR SETTINGS:    Cultures 619 blood NGTD     Antimicrobials: Anti-infectives    Start     Stop   08/23/16 2000  cefTRIAXone (ROCEPHIN) 2 g in dextrose 5 % 50 mL IVPB     08/29/16 1959   08/22/16 2000  cefTRIAXone (ROCEPHIN) 1 g in dextrose 5 % 50 mL IVPB  Status:  Discontinued     08/23/16 0914       Devices    LINES / TUBES:  PIV     Continuous Infusions: . sodium chloride 150 mL/hr at 08/29/16 0506     Objective: Vitals:   08/28/16 1915 08/28/16 2240 08/29/16 0251 08/29/16 0732  BP: 133/90 111/78 126/87 101/69  Pulse: 97 94 98 96  Resp: (!) 22 (!) 25 (!) 25 20  Temp: 98.7 F (37.1 C) 98.6 F (37 C) 98.5 F (36.9 C) 97.6 F (36.4 C)  TempSrc: Axillary Axillary Axillary Axillary  SpO2: 100% 100% 100% 100%  Weight:      Height:        Intake/Output Summary (Last 24 hours) at 08/29/16 1610 Last data filed at 08/29/16 0600  Gross per 24 hour  Intake             3185 ml  Output              951 ml  Net             2234 ml   Filed Weights   08/22/16 1557 08/24/16 1739 08/28/16 0220  Weight: 185 lb (83.9 kg) 185 lb 3 oz (84 kg) 185 lb 3 oz (84 kg)    Examination:  GeneralA/O 1 (does not know when,Where, why), follows commands,: No acute respiratory distress Eyes: positive icterus ENT: Negative Runny nose, negative gingival bleeding, Neck:  Negative scars, masses, torticollis, lymphadenopathy, JVD Lungs: clear to  auscultation bilateral, negative crackles  Cardiovascular: Regular rate and rhythm without murmur gallop or rub normal S1 and S2 Abdomen: negative abdominal pain,  positive distended (ascites), positive soft, bowel sounds, no rebound, no ascites, no appreciable mass Extremities: No significant cyanosis, clubbing, or edema bilateral lower extremities Skin: Jaundice Psychiatric:  Unable to assess secondary to patient's altered mental status   Central nervous system:  Patient moves all extremities spontaneously, follows some commands, muscle strength all extremities appears 5/5, sensation intact, Cranial nerves II through XII intact, tongue/uvula midline, all extremities muscle strength 5/5, sensation intact t positive dysarthria, positive expressive aphasia, positive receptive aphasia.  .     Data Reviewed: Care during the described time interval was provided by me .  I have reviewed this patient's available data, including medical history, events of note, physical examination, and all test results as part of my evaluation. I have personally reviewed and interpreted all radiology studies.  CBC:  Recent Labs Lab 08/23/16 1154 08/24/16 0252 08/25/16 0259 08/27/16 0215 08/28/16 0224  WBC 35.8* 36.8* 34.2* 34.1* 30.9*  NEUTROABS 33.3*  --   --   --   --   HGB 9.7* 9.4* 9.1* 11.0* 11.1*  HCT 26.9* 26.6* 26.5* 33.5* 33.9*  MCV 103.9* 106.0* 108.2* 112.8* 115.3*  PLT 102* 118* 134* 142* 126*   Basic Metabolic Panel:  Recent Labs Lab 08/23/16 1154  08/25/16 0259 08/25/16 1235 08/26/16 0238 08/27/16 0215 08/28/16 0224 08/29/16 0324  NA  --   < > 127*  --  135 144 152* 153*  K  --   < > 2.6* 3.2* 3.2* 3.0* 3.8 4.0  CL  --   < > 96*  --  104 114* 121* 125*  CO2  --   < > 18*  --  16* 18* 19* 17*  GLUCOSE  --   < > 110*  --  185* 73 294* 215*  BUN  --   < > 68*  --  68* 62* 57* 57*  CREATININE  --   < > 2.85*  --  2.36* 2.06* 2.05* 2.00*  CALCIUM  --   < > 7.1*  --  7.1* 7.7* 8.1*  8.3*  MG 2.6*  --  2.8* 2.9* 3.0* 3.1*  --   --   PHOS  --   --   --   --   --   --  4.9*  --   < > = values in this interval not displayed. GFR: Estimated Creatinine Clearance: 57.7 mL/min (A) (by C-G formula based on SCr of 2 mg/dL (H)). Liver Function Tests:  Recent Labs Lab 08/24/16 0252 08/25/16 0259 08/27/16 0215 08/28/16 0224 08/29/16 0324  AST 77* 85* 139* 125* 145*  ALT 33 36 61 73* 88*  ALKPHOS 111 101 174* 223* 245*  BILITOT 39.9* 19.6* 44.0* 43.7* 47.3*  PROT 4.8* 5.1* 5.2* 5.1* 5.0*  ALBUMIN 2.6* 3.1* 2.9* 2.7* 2.7*   No results for input(s): LIPASE, AMYLASE in the last 168 hours.  Recent Labs Lab 08/25/16 0259 08/26/16 0238 08/27/16 0215 08/28/16 0224 08/29/16 0324  AMMONIA 124* 158* 102* 61* 37*   Coagulation Profile:  Recent Labs Lab 08/22/16 1749 08/23/16 0603 08/24/16 0252 08/27/16 1046 08/28/16 0224  INR 1.61 1.82 1.54 1.67 1.74   Cardiac Enzymes: No results for input(s): CKTOTAL, CKMB, CKMBINDEX, TROPONINI in the last 168 hours. BNP (last 3 results) No results for input(s): PROBNP in the last 8760 hours. HbA1C: No results for input(s): HGBA1C in the last 72 hours. CBG:  Recent Labs Lab 08/28/16 0735 08/28/16 1211 08/28/16 1736 08/28/16 2115 08/29/16 0747  GLUCAP 326* 332* 148* 180* 184*   Lipid Profile: No results for input(s): CHOL, HDL, LDLCALC, TRIG, CHOLHDL, LDLDIRECT in the last 72 hours. Thyroid Function Tests: No results for input(s): TSH, T4TOTAL, FREET4, T3FREE, THYROIDAB in the last 72 hours. Anemia Panel:  Recent Labs  08/27/16 0215  VITAMINB12 6,223*  FOLATE 13.6  FERRITIN 3,022*  TIBC NOT CALCULATED  IRON 31*  RETICCTPCT 6.1*   Urine analysis:    Component Value Date/Time   COLORURINE AMBER (A) 08/23/2016 0236   APPEARANCEUR HAZY (A) 08/23/2016 0236   LABSPEC 1.014 08/23/2016 0236   PHURINE 5.0 08/23/2016 0236  GLUCOSEU 50 (A) 08/23/2016 0236   HGBUR MODERATE (A) 08/23/2016 0236   BILIRUBINUR  MODERATE (A) 08/23/2016 0236   KETONESUR NEGATIVE 08/23/2016 0236   PROTEINUR NEGATIVE 08/23/2016 0236   UROBILINOGEN 0.2 02/20/2010 1837   NITRITE NEGATIVE 08/23/2016 0236   LEUKOCYTESUR NEGATIVE 08/23/2016 0236   Sepsis Labs: @LABRCNTIP (procalcitonin:4,lacticidven:4)  ) Recent Results (from the past 240 hour(s))  Blood culture (routine x 2)     Status: None   Collection Time: 08/22/16  5:40 PM  Result Value Ref Range Status   Specimen Description BLOOD RIGHT ANTECUBITAL  Final   Special Requests   Final    BOTTLES DRAWN AEROBIC AND ANAEROBIC Blood Culture adequate volume   Culture NO GROWTH 5 DAYS  Final   Report Status 08/27/2016 FINAL  Final  Blood culture (routine x 2)     Status: None   Collection Time: 08/22/16  5:49 PM  Result Value Ref Range Status   Specimen Description BLOOD BLOOD LEFT FOREARM  Final   Special Requests   Final    BOTTLES DRAWN AEROBIC AND ANAEROBIC Blood Culture adequate volume   Culture NO GROWTH 5 DAYS  Final   Report Status 08/27/2016 FINAL  Final  MRSA PCR Screening     Status: None   Collection Time: 08/22/16 11:15 PM  Result Value Ref Range Status   MRSA by PCR NEGATIVE NEGATIVE Final    Comment:        The GeneXpert MRSA Assay (FDA approved for NASAL specimens only), is one component of a comprehensive MRSA colonization surveillance program. It is not intended to diagnose MRSA infection nor to guide or monitor treatment for MRSA infections.          Radiology Studies: No results found.      Scheduled Meds: . feeding supplement (GLUCERNA SHAKE)  237 mL Oral TID BM  . folic acid  1 mg Oral Daily  . insulin aspart  0-15 Units Subcutaneous TID WC  . insulin glargine  8 Units Subcutaneous Daily  . lactulose  20 g Oral BID  . multivitamin with minerals  1 tablet Oral Daily  . pantoprazole  40 mg Oral Q0600  . prednisoLONE  40 mg Oral Daily  . rifaximin  550 mg Oral BID  . thiamine  100 mg Oral Daily   Continuous  Infusions: . sodium chloride 150 mL/hr at 08/29/16 0506     LOS: 7 days    Time spent: 40 minutes    Braysen Cloward, Roselind Messier, MD [facility] Pager [phone]   If 7PM-7AM, please contact night-coverage [email or web address] Password [email or web address] 08/29/2016, 8:08 AM

## 2016-08-29 NOTE — Plan of Care (Signed)
Problem: Safety: Goal: Ability to remain free from injury will improve Outcome: Progressing Patient remains intermittently confused, explained need to call for needs tele sitter at bedside with frequently rounding.

## 2016-08-29 NOTE — Progress Notes (Signed)
Assessment/ Plan:   1. Acute kidney injury: Occurring in the setting of acute alcoholic hepatitis superimposed on decompensated cirrhosis.  He is dehydrated currently  2. Acute decompensated cirrhosis secondary to EtOH abuse: solumedrol -->prednisolone. Has some HE, on lactulose and Xifaxin too. Being empirically treated with CTX for SBP as well.  3. EtOH abuse: no signs of DTs, continuing CIWA protocol, substance abuse counseling  6. Hyponatremia: improved, overcorrected  7.  Hypernatremia: Due to GI losses, decreased intake.  Will give D5W IVFs, watch BS  Subjective: Interval History: thirsty  Objective: Vital signs in last 24 hours: Temp:  [97.5 F (36.4 C)-98.7 F (37.1 C)] 97.6 F (36.4 C) (06/26 0732) Pulse Rate:  [94-104] 96 (06/26 0732) Resp:  [20-30] 20 (06/26 0732) BP: (101-133)/(62-90) 101/69 (06/26 0732) SpO2:  [100 %] 100 % (06/26 0732) Weight change:   Intake/Output from previous day: 06/25 0701 - 06/26 0700 In: 3335 [P.O.:920; I.V.:2400] Out: 1251 [Urine:1250; Stool:1] Intake/Output this shift: Total I/O In: 150 [I.V.:150] Out: -   General appearance: alert GI: protuberant and mild tenderness Extremities: no edema, decreased turgor  Lab Results:  Recent Labs  08/27/16 0215 08/28/16 0224  WBC 34.1* 30.9*  HGB 11.0* 11.1*  HCT 33.5* 33.9*  PLT 142* 126*   BMET:  Recent Labs  08/28/16 0224 08/29/16 0324  NA 152* 153*  K 3.8 4.0  CL 121* 125*  CO2 19* 17*  GLUCOSE 294* 215*  BUN 57* 57*  CREATININE 2.05* 2.00*  CALCIUM 8.1* 8.3*   No results for input(s): PTH in the last 72 hours. Iron Studies:  Recent Labs  08/27/16 0215  IRON 31*  TIBC NOT CALCULATED  FERRITIN 3,022*   Studies/Results: No results found.  Scheduled: . feeding supplement (GLUCERNA SHAKE)  237 mL Oral TID BM  . folic acid  1 mg Oral Daily  . insulin aspart  0-15 Units Subcutaneous TID WC  . insulin glargine  8 Units Subcutaneous Daily  . lactulose   20 g Oral BID  . multivitamin with minerals  1 tablet Oral Daily  . pantoprazole  40 mg Oral Q0600  . prednisoLONE  40 mg Oral Daily  . rifaximin  550 mg Oral BID  . thiamine  100 mg Oral Daily     LOS: 7 days   Tayte Mcwherter C 08/29/2016,10:28 AM

## 2016-08-30 ENCOUNTER — Inpatient Hospital Stay (HOSPITAL_COMMUNITY): Payer: Medicaid Other

## 2016-08-30 LAB — CBC
HCT: 36.3 % — ABNORMAL LOW (ref 39.0–52.0)
HEMOGLOBIN: 11.7 g/dL — AB (ref 13.0–17.0)
MCH: 38.4 pg — ABNORMAL HIGH (ref 26.0–34.0)
MCHC: 32.2 g/dL (ref 30.0–36.0)
MCV: 119 fL — ABNORMAL HIGH (ref 78.0–100.0)
Platelets: 73 10*3/uL — ABNORMAL LOW (ref 150–400)
RBC: 3.05 MIL/uL — ABNORMAL LOW (ref 4.22–5.81)
RDW: 13.8 % (ref 11.5–15.5)
WBC: 28.1 10*3/uL — AB (ref 4.0–10.5)

## 2016-08-30 LAB — COMPREHENSIVE METABOLIC PANEL
ALBUMIN: 2.3 g/dL — AB (ref 3.5–5.0)
ALT: 88 U/L — ABNORMAL HIGH (ref 17–63)
ANION GAP: 12 (ref 5–15)
AST: 129 U/L — AB (ref 15–41)
Alkaline Phosphatase: 248 U/L — ABNORMAL HIGH (ref 38–126)
BUN: 51 mg/dL — AB (ref 6–20)
CHLORIDE: 118 mmol/L — AB (ref 101–111)
CO2: 13 mmol/L — AB (ref 22–32)
Calcium: 7.6 mg/dL — ABNORMAL LOW (ref 8.9–10.3)
Creatinine, Ser: 2.07 mg/dL — ABNORMAL HIGH (ref 0.61–1.24)
GFR calc Af Amer: 45 mL/min — ABNORMAL LOW (ref >60)
GFR calc non Af Amer: 39 mL/min — ABNORMAL LOW (ref >60)
GLUCOSE: 433 mg/dL — AB (ref 65–99)
POTASSIUM: 4.8 mmol/L (ref 3.5–5.1)
SODIUM: 143 mmol/L (ref 135–145)
Total Bilirubin: 41.3 mg/dL (ref 0.3–1.2)
Total Protein: 4.3 g/dL — ABNORMAL LOW (ref 6.5–8.1)

## 2016-08-30 LAB — GLUCOSE, CAPILLARY
GLUCOSE-CAPILLARY: 388 mg/dL — AB (ref 65–99)
GLUCOSE-CAPILLARY: 340 mg/dL — AB (ref 65–99)
Glucose-Capillary: 407 mg/dL — ABNORMAL HIGH (ref 65–99)

## 2016-08-30 LAB — AMMONIA: Ammonia: 22 umol/L (ref 9–35)

## 2016-08-30 MED ORDER — INSULIN GLARGINE 100 UNIT/ML ~~LOC~~ SOLN
18.0000 [IU] | Freq: Every day | SUBCUTANEOUS | Status: DC
  Administered 2016-08-31 – 2016-09-03 (×4): 18 [IU] via SUBCUTANEOUS
  Filled 2016-08-30 (×6): qty 0.18

## 2016-08-30 MED ORDER — INSULIN GLARGINE 100 UNIT/ML ~~LOC~~ SOLN
10.0000 [IU] | SUBCUTANEOUS | Status: AC
  Administered 2016-08-30: 10 [IU] via SUBCUTANEOUS
  Filled 2016-08-30: qty 0.1

## 2016-08-30 NOTE — Progress Notes (Signed)
Nutrition Follow-up  DOCUMENTATION CODES:   Not applicable  INTERVENTION:    Continue Glucerna Shake po TID, each supplement provides 220 kcal and 10 grams of protein  Continue MVI daily  NUTRITION DIAGNOSIS:   Inadequate oral intake related to poor appetite as evidenced by meal completion < 50%.  Ongoing  GOAL:   Patient will meet greater than or equal to 90% of their needs   Unmet  MONITOR:   PO intake, Supplement acceptance, Labs, I & O's  ASSESSMENT:   41 yo male with hx of diet controlled DM, tobacco and heavy alcohol abuse who was admitted on 6/19 with abnormal electrolytes, jaundice, slurred speech, and multiple falls. Ultrasound of the abdomen showed nodular liver likely related to hepatic cirrhosis.  Poor oral intake persists; patient is consuming 10-25% of meals. He is also receiving Glucerna Shake supplements TID between meals, consumes 1-3 per day.  Labs reviewed: CBG's: [phone] Medications reviewed and include folic acid, lactulose, MVI, and thiamine.  Diet Order:  Diet Heart Room service appropriate? Yes; Fluid consistency: Thin  Skin:  Wound (see comment) (stage I to buttocks)  Last BM:  6/26  Height:   Ht Readings from Last 1 Encounters:  08/24/16 6\' 2"  (1.88 m)    Weight:   Wt Readings from Last 1 Encounters:  08/28/16 185 lb 3 oz (84 kg)    Ideal Body Weight:  86.4 kg  BMI:  Body mass index is 23.78 kg/m.  Estimated Nutritional Needs:   Kcal:  2300-2500  Protein:  100-120 gm  Fluid:  1.8-2 L  EDUCATION NEEDS:   No education needs identified at this time  Joaquin CourtsKimberly Harris, RD, LDN, CNSC Pager [phone] After Hours Pager [phone]

## 2016-08-30 NOTE — Progress Notes (Addendum)
PROGRESS NOTE    Tyler Clarke  ZOX:[id number] DOB: 03-Jul-1975 DOA: 08/22/2016 PCP: Kaleen Mask, MD   Brief Narrative:   41 year old WM PMHx Current smoker, heavy EtOH Abuse, DM type 2,Polycythemia  Who was sent to the ER today by his primary MD for abnormal labs and slurred speech that started on Wednesday.    Patient reports vaguely not to be feeling well for the last several weeks and reports a yellowish tint to his skin that started several days ago.  He additionally admits to recent falls at home.  Denies any abdominal pain, cough, shortness of breath, fever, chills, nausea, vomiting, diarrhea, bloody stools, weight gain, swelling, or dizziness.  He reports he currently smokes half to one pack of cigarettes a day, drinks 24 beers daily (last drink yesterday), and denies any recreational drug use.  Reports his diabetes was previously diet controlled.    In the ED, he was noted to have significant jaundice and hypotension of 80/51, heart rate 91,afebrile 97.3, and 96% on room air. He has been alert and oriented.  Labs noted for Na 114, K < 2, Cl 20, glucose 304, BUN 53, sCr 3.15 (previously 0.4 on 07/27/16), AG 21, albumin 1.9, AST 123, t. Bili 36.6,  WBC 39.2, Hgb 11.3, Hct 30.8, plts 147, INR 1.61, PT 19.3, other labs pending.  Clarke CT for recurrent falls was negative. EKG noted for prolonged QTc 0.540.  Stool was guaiac positive.  He was treated with 1L NS, 1 L LR, started on KCL and mag replacement, protonix bolus and drip started, and placed on octreotide.  Systolic blood pressure remains in the 90's.  PCCM to admit to ICU for hepatic and renal dysfunction.     Subjective: No significant events overnight per nursing staff, he reports he had 2 bowel movements overnight, afebrile, no nausea, no vomiting, with poor oral intake   Assessment & Plan:   Active Problems:   Liver failure (HCC)   Alcoholic cirrhosis of liver without ascites (HCC)   Pressure injury of skin  Encephalopathy   Alcoholic hepatitis without ascites   Encephalopathy, hepatic (HCC)  Decompensated liver cirrhosis secondary to severe alcohol liver disease with alcoholic hepatitis  - GI input greatly appreciated, continue with prednisolone, day # 9 . - Continue with supportive care, ammonia level has improved, continue with Actos for target 3-4 bowel movements per day . - Abdominal ultrasound consistent with hepatic cirrhosis: Minimal ascites - Pending AFP, Acute hepatitis panel negative - Continue with rifaximin - per GI :Avoiding paracentesis due to AKI.  Completed 1 week empiric Rocephin  Hepatic encephalopathy - Remains encephalopathic, ammonia has improved, continue with lactulose and rifaximin  Hypernatremia - Resolved with D5W, is continue D5W given hyperglycemia, encouraged fluid intake  Acute kidney injury - Most likely hepatorenal secondary to acute alcoholic hepatitis with decompensated cirrhosis, improved with hydration  Thrombocytopenia - Continues to decline, this is most likely related to liver disease  Urinary obstruction - Required Foley catheter insertion yesterday  Leukocytosis - Trending down slowly, currently on steroids, a febrile - Received one week off ceftriaxone for SBP prophylaxis  Alcohol abuse(24 beers daily),/Acute alcoholic hepatitis with ascites - MELD score 36  - Ativan PRN - Monitor neuro status  - Continue daily MVI/ folate/ thiamine  Severe protein calorie malnutrition - Encourage patient to continue to take by mouth food/drink  Prolonged QTc - Avoid meds that prolong QTc    DM type II controlled with complication -  A1c  is 6.9, currently uncontrolled most likely related to D5W, but it remains significantly elevated 2 hours after stopping D5W, so we'll increase his Lantus dose from 8-18 units daily,(will be giving extra 10 units a day on top of the Clarke he received earlier this morning).    We'll check chest x-ray giving  bibasilar crackles.  DVT prophylaxis: SCD Code Status: DO NOT RESUSCITATE Family Communication: None  Disposition Plan: TBD   Consultants:  [facility] Dr. Claudette Clarke Nephrology Dr. Bufford Clarke Tyler Clarke  Cultures 619 blood NGTD  Antimicrobials: Anti-infectives    Start     Stop   08/23/16 2000  cefTRIAXone (ROCEPHIN) 2 g in dextrose 5 % 50 mL IVPB     08/29/16 1959   08/22/16 2000  cefTRIAXone (ROCEPHIN) 1 g in dextrose 5 % 50 mL IVPB  Status:  Discontinued     08/23/16 0914           Objective: Vitals:   08/29/16 2234 08/30/16 0327 08/30/16 0700 08/30/16 0900  BP: 115/85 103/75  111/73  Pulse: 86 84    Resp: (!) 21 15    Temp: 97.6 F (36.4 C) 97.6 F (36.4 C) (!) 96.3 F (35.7 C)   TempSrc: Axillary Axillary Axillary   SpO2: 100% 100%    Weight:      Height:        Intake/Output Summary (Last 24 hours) at 08/30/16 1058 Last data filed at 08/30/16 0700  Gross per 24 hour  Intake          3338.75 ml  Output             2300 ml  Net          1038.75 ml   Filed Weights   08/22/16 1557 08/24/16 1739 08/28/16 0220  Weight: 83.9 kg (185 lb) 84 kg (185 lb 3 oz) 84 kg (185 lb 3 oz)    Examination:  Awake alert time 1, confused. Simple commands  Icteric sclera  No definitive bilaterally, no use of accessory muscle, mild bibasilar crackles  Regular rate and rhythm, no rubs, gallops  Abdominal distended secondary to ascites, but soft, nontender, bowel sounds present  Images was significant bruising, no cyanosis, no clubbing, or edema \\impaired  judgment and insight   .     Data Reviewed: Care during the described time interval was provided by me .  I have reviewed this patient's available data, including medical history, events of note, physical examination, and all test results as part of my evaluation. I have personally reviewed and interpreted all radiology studies.  CBC:  Recent Labs Lab 08/23/16 1154 08/24/16 0252 08/25/16 0259  08/27/16 0215 08/28/16 0224 08/30/16 0715  WBC 35.8* 36.8* 34.2* 34.1* 30.9* 28.1*  NEUTROABS 33.3*  --   --   --   --   --   HGB 9.7* 9.4* 9.1* 11.0* 11.1* 11.7*  HCT 26.9* 26.6* 26.5* 33.5* 33.9* 36.3*  MCV 103.9* 106.0* 108.2* 112.8* 115.3* 119.0*  PLT 102* 118* 134* 142* 126* 73*   Basic Metabolic Panel:  Recent Labs Lab 08/23/16 1154  08/25/16 0259 08/25/16 1235 08/26/16 0238 08/27/16 0215 08/28/16 0224 08/29/16 0324 08/30/16 0715  NA  --   < > 127*  --  135 144 152* 153* 143  K  --   < > 2.6* 3.2* 3.2* 3.0* 3.8 4.0 4.8  CL  --   < > 96*  --  104 114* 121* 125* 118*  CO2  --   < >  18*  --  16* 18* 19* 17* 13*  GLUCOSE  --   < > 110*  --  185* 73 294* 215* 433*  BUN  --   < > 68*  --  68* 62* 57* 57* 51*  CREATININE  --   < > 2.85*  --  2.36* 2.06* 2.05* 2.00* 2.07*  CALCIUM  --   < > 7.1*  --  7.1* 7.7* 8.1* 8.3* 7.6*  MG 2.6*  --  2.8* 2.9* 3.0* 3.1*  --   --   --   PHOS  --   --   --   --   --   --  4.9*  --   --   < > = values in this interval not displayed. GFR: Estimated Creatinine Clearance: 55.7 mL/min (A) (by C-G formula based on SCr of 2.07 mg/dL (H)). Liver Function Tests:  Recent Labs Lab 08/25/16 0259 08/27/16 0215 08/28/16 0224 08/29/16 0324 08/30/16 0715  AST 85* 139* 125* 145* 129*  ALT 36 61 73* 88* 88*  ALKPHOS 101 174* 223* 245* 248*  BILITOT 19.6* 44.0* 43.7* 47.3* 41.3*  PROT 5.1* 5.2* 5.1* 5.0* 4.3*  ALBUMIN 3.1* 2.9* 2.7* 2.7* 2.3*   No results for input(s): LIPASE, AMYLASE in the last 168 hours.  Recent Labs Lab 08/26/16 0238 08/27/16 0215 08/28/16 0224 08/29/16 0324 08/30/16 0313  AMMONIA 158* 102* 61* 37* 22   Coagulation Profile:  Recent Labs Lab 08/24/16 0252 08/27/16 1046 08/28/16 0224  INR 1.54 1.67 1.74   Cardiac Enzymes: No results for input(s): CKTOTAL, CKMB, CKMBINDEX, TROPONINI in the last 168 hours. BNP (last 3 results) No results for input(s): PROBNP in the last 8760 hours. HbA1C: No results for  input(s): HGBA1C in the last 72 hours. CBG:  Recent Labs Lab 08/29/16 0747 08/29/16 1127 08/29/16 1647 08/29/16 2111 08/30/16 0758  GLUCAP 184* 243* 203* 267* 407*   Lipid Profile: No results for input(s): CHOL, HDL, LDLCALC, TRIG, CHOLHDL, LDLDIRECT in the last 72 hours. Thyroid Function Tests: No results for input(s): TSH, T4TOTAL, FREET4, T3FREE, THYROIDAB in the last 72 hours. Anemia Panel: No results for input(s): VITAMINB12, FOLATE, FERRITIN, TIBC, IRON, RETICCTPCT in the last 72 hours. Urine analysis:    Component Value Date/Time   COLORURINE AMBER (A) 08/23/2016 0236   APPEARANCEUR HAZY (A) 08/23/2016 0236   LABSPEC 1.014 08/23/2016 0236   PHURINE 5.0 08/23/2016 0236   GLUCOSEU 50 (A) 08/23/2016 0236   HGBUR MODERATE (A) 08/23/2016 0236   BILIRUBINUR MODERATE (A) 08/23/2016 0236   KETONESUR NEGATIVE 08/23/2016 0236   PROTEINUR NEGATIVE 08/23/2016 0236   UROBILINOGEN 0.2 02/20/2010 1837   NITRITE NEGATIVE 08/23/2016 0236   LEUKOCYTESUR NEGATIVE 08/23/2016 0236   Sepsis Labs: @LABRCNTIP (procalcitonin:4,lacticidven:4)  ) Recent Results (from the past 240 hour(s))  Blood culture (routine x 2)     Status: None   Collection Time: 08/22/16  5:40 PM  Result Value Ref Range Status   Specimen Description BLOOD RIGHT ANTECUBITAL  Final   Special Requests   Final    BOTTLES DRAWN AEROBIC AND ANAEROBIC Blood Culture adequate volume   Culture NO GROWTH 5 DAYS  Final   Report Status 08/27/2016 FINAL  Final  Blood culture (routine x 2)     Status: None   Collection Time: 08/22/16  5:49 PM  Result Value Ref Range Status   Specimen Description BLOOD BLOOD LEFT FOREARM  Final   Special Requests   Final    BOTTLES DRAWN AEROBIC AND  ANAEROBIC Blood Culture adequate volume   Culture NO GROWTH 5 DAYS  Final   Report Status 08/27/2016 FINAL  Final  MRSA PCR Screening     Status: None   Collection Time: 08/22/16 11:15 PM  Result Value Ref Range Status   MRSA by PCR NEGATIVE  NEGATIVE Final    Comment:        The GeneXpert MRSA Assay (FDA approved for NASAL specimens only), is one component of a comprehensive MRSA colonization surveillance program. It is not intended to diagnose MRSA infection nor to guide or monitor treatment for MRSA infections.          Radiology Studies: No results found.      Scheduled Meds: . feeding supplement (GLUCERNA SHAKE)  237 mL Oral TID BM  . folic acid  1 mg Oral Daily  . free water  200 mL Oral Q8H  . insulin aspart  0-15 Units Subcutaneous TID WC  . insulin glargine  8 Units Subcutaneous Daily  . lactulose  20 g Oral BID  . multivitamin with minerals  1 tablet Oral Daily  . pantoprazole  40 mg Oral Q0600  . prednisoLONE  40 mg Oral Daily  . rifaximin  550 mg Oral BID  . thiamine  100 mg Oral Daily   Continuous Infusions:    LOS: 8 days     Huey Bienenstock, MD Pager [phone] [facility] Pager [phone]   If 7PM-7AM, please contact night-coverage [email or web address] Password Tyler Clarke 08/30/2016, 10:58 AM

## 2016-08-30 NOTE — Progress Notes (Signed)
Assessment/ Plan:   1. Acute kidney injury: Occurring in the setting of acute alcoholic hepatitis superimposed on decompensated cirrhosis. He is dehydrated currently, stable  2. Acute decompensated cirrhosis secondary to EtOH abuse  3. Hyponatremia: improved, overcorrected   4. Hypernatremia: Resolved              Renal will sign off  Subjective: Interval History: feels better  Objective: Vital signs in last 24 hours: Temp:  [96.3 F (35.7 C)-98.1 F (36.7 C)] 96.3 F (35.7 C) (06/27 0700) Pulse Rate:  [84-101] 84 (06/27 0327) Resp:  [15-26] 15 (06/27 0327) BP: (99-133)/(71-85) 111/73 (06/27 0900) SpO2:  [100 %] 100 % (06/27 0327) Weight change:   Intake/Output from previous day: 06/26 0701 - 06/27 0700 In: 3608.8 [P.O.:370; I.V.:3238.8] Out: 2300 [Urine:2300] Intake/Output this shift: No intake/output data recorded.  General appearance: alert GI: protuberant, ascites Skin: jaundiced  Lab Results:  Recent Labs  08/28/16 0224 08/30/16 0715  WBC 30.9* 28.1*  HGB 11.1* 11.7*  HCT 33.9* 36.3*  PLT 126* 73*   BMET:  Recent Labs  08/29/16 0324 08/30/16 0715  NA 153* 143  K 4.0 4.8  CL 125* 118*  CO2 17* 13*  GLUCOSE 215* 433*  BUN 57* 51*  CREATININE 2.00* 2.07*  CALCIUM 8.3* 7.6*   No results for input(s): PTH in the last 72 hours. Iron Studies: No results for input(s): IRON, TIBC, TRANSFERRIN, FERRITIN in the last 72 hours. Studies/Results: No results found.  Scheduled: . feeding supplement (GLUCERNA SHAKE)  237 mL Oral TID BM  . folic acid  1 mg Oral Daily  . free water  200 mL Oral Q8H  . insulin aspart  0-15 Units Subcutaneous TID WC  . insulin glargine  8 Units Subcutaneous Daily  . lactulose  20 g Oral BID  . multivitamin with minerals  1 tablet Oral Daily  . pantoprazole  40 mg Oral Q0600  . prednisoLONE  40 mg Oral Daily  . rifaximin  550 mg Oral BID  . thiamine  100 mg Oral Daily    LOS: 8 days   Tyler Clarke  C 08/30/2016,9:29 AM

## 2016-08-30 NOTE — Progress Notes (Signed)
Pt unable to void having I & O cathed 3 times . MD notified orders received to place urinary catheter. Placed catheter per Hospital policy K. Vear [employer] Psychologist, clinicalassisting RN. Patient tolerated fair.

## 2016-08-30 NOTE — Progress Notes (Signed)
Daily Rounding Note  08/30/2016, 9:09 AM  LOS: 8 days   SUBJECTIVE:   Chief complaint: neck pain is better.      Not eating much.  4 BMs, not bloody, yesterday. Foley in place now. 2.3v liters of urine yesterday.    Meager PO intake persists.  Taking ~ 10% of meals.  OBJECTIVE:         Vital signs in last 24 hours:    Temp:  [96.3 F (35.7 C)-98.1 F (36.7 C)] 96.3 F (35.7 C) (06/27 0700) Pulse Rate:  [84-101] 84 (06/27 0327) Resp:  [15-26] 15 (06/27 0327) BP: (99-133)/(71-85) 103/75 (06/27 0327) SpO2:  [100 %] 100 % (06/27 0327) Last BM Date: 08/29/16 Filed Weights   08/22/16 1557 08/24/16 1739 08/28/16 0220  Weight: 83.9 kg (185 lb) 84 kg (185 lb 3 oz) 84 kg (185 lb 3 oz)   General: ill, jaundiced.  Alert but slow to respond   Heart: RRR Chest: tight cough, some increased WOB Abdomen: protuberant and NT.  Some increased tension but not tense  Extremities: no CCE.  Global muscular atrophy Neuro/Psych:  Oriented to place, and self, not to year.  Follows commands.  Hand tremors but not asterixis  Intake/Output from previous day: 06/26 0701 - 06/27 0700 In: 3608.8 [P.O.:370; I.V.:3238.8] Out: 2300 [Urine:2300]  Intake/Output this shift: No intake/output data recorded.  Lab Results:  Recent Labs  08/28/16 0224 08/30/16 0715  WBC 30.9* 28.1*  HGB 11.1* 11.7*  HCT 33.9* 36.3*  PLT 126* 73*   BMET  Recent Labs  08/28/16 0224 08/29/16 0324 08/30/16 0715  NA 152* 153* 143  K 3.8 4.0 4.8  CL 121* 125* 118*  CO2 19* 17* 13*  GLUCOSE 294* 215* 433*  BUN 57* 57* 51*  CREATININE 2.05* 2.00* 2.07*  CALCIUM 8.1* 8.3* 7.6*   LFT  Recent Labs  08/28/16 0224 08/29/16 0324 08/30/16 0715  PROT 5.1* 5.0* 4.3*  ALBUMIN 2.7* 2.7* 2.3*  AST 125* 145* 129*  ALT 73* 88* 88*  ALKPHOS 223* 245* 248*  BILITOT 43.7* 47.3* 41.3*   PT/INR  Recent Labs  08/27/16 1046 08/28/16 0224  LABPROT 19.9* 20.5*   INR 1.67 1.74   Hepatitis Panel No results for input(s): HEPBSAG, HCVAB, HEPAIGM, HEPBIGM in the last 72 hours.  Studies/Results: No results found.   Scheduled Meds: . feeding supplement (GLUCERNA SHAKE)  237 mL Oral TID BM  . folic acid  1 mg Oral Daily  . free water  200 mL Oral Q8H  . insulin aspart  0-15 Units Subcutaneous TID WC  . insulin glargine  8 Units Subcutaneous Daily  . lactulose  20 g Oral BID  . multivitamin with minerals  1 tablet Oral Daily  . pantoprazole  40 mg Oral Q0600  . prednisoLONE  40 mg Oral Daily  . rifaximin  550 mg Oral BID  . thiamine  100 mg Oral Daily   Continuous Infusions: PRN Meds:.haloperidol, ipratropium-albuterol, LORazepam   ASSESMENT:   *  Severe ETOH liver disease with ETOH hepatitis and decompensated cirrhosis.  Day 9 steroids Solumedrol >> prednisolone for initial discriminant fx of 52.  Calculated based on 6/25 labs: 83.    *  AKI.  Improved, BUN/Creat at plateau.  CompletedOctreotide, midodrine, Albumin for concerns of HRS type 1.    *  HE.  Rifaximin, Lactulose in place.   *  Ascites.  Avoiding paracentesis due to AKI.  Completed 1  week empiric Rocephin.    *  Hypernatremia, resolved  *  PCM.  Poor po intake.  Glucerna supplement in place.   *  Thrombocytopenia.  No yet critical but platelets steadily declining.  *  Urinary obstruction.    *  Glucose intolerance vs DM type 2.    *  Leukocytosis without fever.      PLAN   *  Supportive care.  If it resolves at all, it will take many weeks/months for jaundice to resolve.  Complete 28 days of steroids.    *  GI will follow from periphery.    *  ? CXR for r/o HAPNA given his cough? Given fluctuating mental status, he is at risk of aspiration. Will leave to primary service  Jennye MoccasinSarah Gribbin  08/30/2016, 9:09 AM Pager: [phone]  GI ATTENDING  Interval history data reviewed. Patient seen and examined. Agree with interval progress note. No interval problems. Stable.  Continue with supportive care. Continue steroids. GI will check in periodically but contact us before needed urgently or if you have questions. Thanks  Wilhemina BonitoJohn N. Eda KeysPerry, Jr., M.D. [facility] Division of Gastroenterology

## 2016-08-30 NOTE — Care Management Note (Signed)
Case Management Note  Patient Details  Name: Tyler Clarke MRN: [id number] Date of Birth: [birth year]  Subjective/Objective:  Liver failure, encephalopathy, alcohol hepatitis, poor intake                  Action/Plan: Discharge Planning: NCM spoke to pt's wife, [name] # [phone], cell #[phone]. States they live in separate households. Pt living with parents at this time. Will continue to follow for dc needs. CSW following for possible SNF.   PCP Kaleen MaskELKINS, WILSON OLIVER   Expected Discharge Date:                  Expected Discharge Plan:  Home w Home Health Services  In-House Referral:  Clinical Social Work  Discharge planning Services  CM Consult  Status of Service:  In process, will continue to follow  If discussed at Long Length of Stay Meetings, dates discussed:    Additional Comments:  Elliot CousinShavis, Tavarion Babington Ellen, RN 08/30/2016, 6:00 PM

## 2016-08-31 LAB — COMPREHENSIVE METABOLIC PANEL
ALBUMIN: 2.1 g/dL — AB (ref 3.5–5.0)
ALK PHOS: 278 U/L — AB (ref 38–126)
ALT: 95 U/L — AB (ref 17–63)
AST: 141 U/L — AB (ref 15–41)
Anion gap: 9 (ref 5–15)
BILIRUBIN TOTAL: 43.6 mg/dL — AB (ref 0.3–1.2)
BUN: 57 mg/dL — AB (ref 6–20)
CALCIUM: 7.9 mg/dL — AB (ref 8.9–10.3)
CO2: 16 mmol/L — ABNORMAL LOW (ref 22–32)
Chloride: 121 mmol/L — ABNORMAL HIGH (ref 101–111)
Creatinine, Ser: 2.42 mg/dL — ABNORMAL HIGH (ref 0.61–1.24)
GFR calc Af Amer: 37 mL/min — ABNORMAL LOW (ref >60)
GFR calc non Af Amer: 32 mL/min — ABNORMAL LOW (ref >60)
GLUCOSE: 124 mg/dL — AB (ref 65–99)
Potassium: 3.4 mmol/L — ABNORMAL LOW (ref 3.5–5.1)
Sodium: 146 mmol/L — ABNORMAL HIGH (ref 135–145)
TOTAL PROTEIN: 4.2 g/dL — AB (ref 6.5–8.1)

## 2016-08-31 LAB — GLUCOSE, CAPILLARY
Glucose-Capillary: 98 mg/dL (ref 65–99)
Glucose-Capillary: 89 mg/dL (ref 65–99)
Glucose-Capillary: 206 mg/dL — ABNORMAL HIGH (ref 65–99)
Glucose-Capillary: 132 mg/dL — ABNORMAL HIGH (ref 65–99)
Glucose-Capillary: 119 mg/dL — ABNORMAL HIGH (ref 65–99)

## 2016-08-31 LAB — CBC
HCT: 33.4 % — ABNORMAL LOW (ref 39.0–52.0)
HEMOGLOBIN: 10.7 g/dL — AB (ref 13.0–17.0)
MCH: 37.4 pg — ABNORMAL HIGH (ref 26.0–34.0)
MCHC: 32 g/dL (ref 30.0–36.0)
MCV: 116.8 fL — ABNORMAL HIGH (ref 78.0–100.0)
Platelets: 70 10*3/uL — ABNORMAL LOW (ref 150–400)
RBC: 2.86 MIL/uL — ABNORMAL LOW (ref 4.22–5.81)
RDW: 13.6 % (ref 11.5–15.5)
WBC: 29.7 10*3/uL — AB (ref 4.0–10.5)

## 2016-08-31 NOTE — Progress Notes (Signed)
Inpatient Diabetes Program Recommendations  AACE/ADA: New Consensus Statement on Inpatient Glycemic Control (2015)  Target Ranges:  Prepandial:   less than 140 mg/dL      Peak postprandial:   less than 180 mg/dL (1-2 hours)      Critically ill patients:  140 - 180 mg/dL   Results for Tyler Clarke, [name] (MRN [id number]) as of 08/31/2016 11:21  Ref. Range 08/30/2016 07:58 08/30/2016 11:18 08/30/2016 16:00 08/31/2016 06:53 08/31/2016 07:41  Glucose-Capillary Latest Ref Range: 65 - 99 mg/dL 409407 (H) 811388 (H) 914340 (H) 98 89   Review of Glycemic Control  Current orders for Inpatient glycemic control: Lantus 18 units daily, Novolog 0-15 units TID with meals  Inpatient Diabetes Program Recommendations:  Insulin-Basal: Noted Lantus increased to 18 units daily on 08/30/16. Correction (SSI): Please consider ordering Novolog 0-5 units QHS for bedtime correction scale.  Thanks, Orlando PennerMarie Fallyn Munnerlyn, RN, MSN, CDE Diabetes Coordinator Inpatient Diabetes Program [phone] (Team Pager from 8am to 5pm)

## 2016-08-31 NOTE — Progress Notes (Signed)
PROGRESS NOTE    Tyler Clarke  XBJ:[id number] DOB: 1975-03-26 DOA: 08/22/2016 PCP: Kaleen Mask, MD   Brief Narrative:  41 year old male with history of heavy alcohol use, diabetes type 2 came to the ER after having abnormal labs outpatient. Patient was found to be in decompensated liver cirrhosis, new diagnosis secondary to alcohol abuse. He has been getting treatment for acute alcoholic hepatitis and renal failure.   Assessment & Plan:   Active Problems:   Liver failure (HCC)   Alcoholic cirrhosis of liver without ascites (HCC)   Pressure injury of skin   Encephalopathy   Alcoholic hepatitis without ascites   Encephalopathy, hepatic (HCC)  Decompensated liver cirrhosis likely secondary to alcohol use Moderate alcoholic hepatitis -Gastroenterology following. Continue prednisone (day 10 ). Plan to complete 28 day course steroids -Acute hepatitis panel negative. Abdominal ultrasound showed hepatic cirrhosis -Continue lactulose and rifaximin -Status post 1 week of Rocephin treatment -AFP checked on 08/22/2016-normal -Supportive care  Hepatic encephalopathy, fluctuating -Continue lactulose and rifaximin. Titrate to 3-4 soft bowel movements daily  Hypernatremia -As improved. Encourage oral liquid -Nephrology was following  Thrombocytopenia  -likely secondary to liver disease  Acute kidney injury -Likely secondary to hepatic renal syndrome? -Continue gentle hydration.  Hypoalbuminemia -If continues to third Place fluid then he may need some albumin supplements  Diabetes type 2 -Currently on insulin regimen. Continue at this time  Alcohol abuse -CIWA protocol  Will have physical therapy see him to help him with some passive  DVT prophylaxis: SCDs Code Status: Do not recess it. Family Communication:  None at bedside Disposition Plan: To be determined  Consultants:   Gastroenterology  Nephrology  Procedures:   None  Subjective: Patient is  pleasantly confused at this time. He is only able to tell me his name. He is alert and awake.  Objective: Vitals:   08/30/16 1845 08/30/16 2140 08/31/16 0502 08/31/16 0921  BP: 114/65 98/65 98/80  115/66  Pulse: 95 86 89 80  Resp: (!) 21 20 19 18   Temp: 98.1 F (36.7 C) 97.4 F (36.3 C) 98.4 F (36.9 C) 97.8 F (36.6 C)  TempSrc: Oral Oral Oral Oral  SpO2: 100% 100% 100% 100%  Weight:  90.3 kg (199 lb 1.2 oz)    Height:        Intake/Output Summary (Last 24 hours) at 08/31/16 1330 Last data filed at 08/31/16 1226  Gross per 24 hour  Intake              957 ml  Output              535 ml  Net              422 ml   Filed Weights   08/24/16 1739 08/28/16 0220 08/30/16 2140  Weight: 84 kg (185 lb 3 oz) 84 kg (185 lb 3 oz) 90.3 kg (199 lb 1.2 oz)    Examination:  General exam: Appears calm and comfortable; AAOx1 to name; jaundiced + scleral icterus Respiratory system: Clear to auscultation. Respiratory effort normal. Cardiovascular system: S1 & S2 heard, RRR. No JVD, murmurs, rubs, gallops or clicks. No pedal edema. Gastrointestinal system: Abdomen is distended but soft and nontender. No organomegaly or masses felt. Normal bowel sounds heard. Central nervous system: Alert and oriented. No focal neurological deficits. Extremities: Symmetric 4+ x 5 power. Skin: No rashes, lesions or ulcers Psychiatry: Judgement and insight poor Mittens in place as he poses danger to himself - pulling on lines and  tube   Data Reviewed:   CBC:  Recent Labs Lab 08/25/16 0259 08/27/16 0215 08/28/16 0224 08/30/16 0715 08/31/16 0344  WBC 34.2* 34.1* 30.9* 28.1* 29.7*  HGB 9.1* 11.0* 11.1* 11.7* 10.7*  HCT 26.5* 33.5* 33.9* 36.3* 33.4*  MCV 108.2* 112.8* 115.3* 119.0* 116.8*  PLT 134* 142* 126* 73* 70*   Basic Metabolic Panel:  Recent Labs Lab 08/25/16 0259 08/25/16 1235 08/26/16 0238 08/27/16 0215 08/28/16 0224 08/29/16 0324 08/30/16 0715 08/31/16 0344  NA 127*  --  135 144  152* 153* 143 146*  K 2.6* 3.2* 3.2* 3.0* 3.8 4.0 4.8 3.4*  CL 96*  --  104 114* 121* 125* 118* 121*  CO2 18*  --  16* 18* 19* 17* 13* 16*  GLUCOSE 110*  --  185* 73 294* 215* 433* 124*  BUN 68*  --  68* 62* 57* 57* 51* 57*  CREATININE 2.85*  --  2.36* 2.06* 2.05* 2.00* 2.07* 2.42*  CALCIUM 7.1*  --  7.1* 7.7* 8.1* 8.3* 7.6* 7.9*  MG 2.8* 2.9* 3.0* 3.1*  --   --   --   --   PHOS  --   --   --   --  4.9*  --   --   --    GFR: Estimated Creatinine Clearance: 47.6 mL/min (A) (by C-G formula based on SCr of 2.42 mg/dL (H)). Liver Function Tests:  Recent Labs Lab 08/27/16 0215 08/28/16 0224 08/29/16 0324 08/30/16 0715 08/31/16 0344  AST 139* 125* 145* 129* 141*  ALT 61 73* 88* 88* 95*  ALKPHOS 174* 223* 245* 248* 278*  BILITOT 44.0* 43.7* 47.3* 41.3* 43.6*  PROT 5.2* 5.1* 5.0* 4.3* 4.2*  ALBUMIN 2.9* 2.7* 2.7* 2.3* 2.1*   No results for input(s): LIPASE, AMYLASE in the last 168 hours.  Recent Labs Lab 08/26/16 0238 08/27/16 0215 08/28/16 0224 08/29/16 0324 08/30/16 0313  AMMONIA 158* 102* 61* 37* 22   Coagulation Profile:  Recent Labs Lab 08/27/16 1046 08/28/16 0224  INR 1.67 1.74   Cardiac Enzymes: No results for input(s): CKTOTAL, CKMB, CKMBINDEX, TROPONINI in the last 168 hours. BNP (last 3 results) No results for input(s): PROBNP in the last 8760 hours. HbA1C: No results for input(s): HGBA1C in the last 72 hours. CBG:  Recent Labs Lab 08/30/16 1118 08/30/16 1600 08/31/16 0653 08/31/16 0741 08/31/16 1228  GLUCAP 388* 340* 98 89 206*   Lipid Profile: No results for input(s): CHOL, HDL, LDLCALC, TRIG, CHOLHDL, LDLDIRECT in the last 72 hours. Thyroid Function Tests: No results for input(s): TSH, T4TOTAL, FREET4, T3FREE, THYROIDAB in the last 72 hours. Anemia Panel: No results for input(s): VITAMINB12, FOLATE, FERRITIN, TIBC, IRON, RETICCTPCT in the last 72 hours. Sepsis Labs: No results for input(s): PROCALCITON, LATICACIDVEN in the last 168  hours.  Recent Results (from the past 240 hour(s))  Blood culture (routine x 2)     Status: None   Collection Time: 08/22/16  5:40 PM  Result Value Ref Range Status   Specimen Description BLOOD RIGHT ANTECUBITAL  Final   Special Requests   Final    BOTTLES DRAWN AEROBIC AND ANAEROBIC Blood Culture adequate volume   Culture NO GROWTH 5 DAYS  Final   Report Status 08/27/2016 FINAL  Final  Blood culture (routine x 2)     Status: None   Collection Time: 08/22/16  5:49 PM  Result Value Ref Range Status   Specimen Description BLOOD BLOOD LEFT FOREARM  Final   Special Requests   Final  BOTTLES DRAWN AEROBIC AND ANAEROBIC Blood Culture adequate volume   Culture NO GROWTH 5 DAYS  Final   Report Status 08/27/2016 FINAL  Final  MRSA PCR Screening     Status: None   Collection Time: 08/22/16 11:15 PM  Result Value Ref Range Status   MRSA by PCR NEGATIVE NEGATIVE Final    Comment:        The GeneXpert MRSA Assay (FDA approved for NASAL specimens only), is one component of a comprehensive MRSA colonization surveillance program. It is not intended to diagnose MRSA infection nor to guide or monitor treatment for MRSA infections.          Radiology Studies: Dg Chest Port 1 View  Result Date: 08/30/2016 CLINICAL DATA:  Tachypnea EXAM: PORTABLE CHEST 1 VIEW COMPARISON:  08/22/2016 chest radiograph. FINDINGS: Stable cardiomediastinal silhouette with normal heart size. No pneumothorax. No pleural effusion. Lungs appear clear, with no acute consolidative airspace disease and no pulmonary edema. Stable healed deformity in the lateral right mid rib. IMPRESSION: No active cardiopulmonary disease. Electronically Signed   By: Delbert Phenix M.D.   On: 08/30/2016 12:17        Scheduled Meds: . feeding supplement (GLUCERNA SHAKE)  237 mL Oral TID BM  . folic acid  1 mg Oral Daily  . free water  200 mL Oral Q8H  . insulin aspart  0-15 Units Subcutaneous TID WC  . insulin glargine  18 Units  Subcutaneous Daily  . lactulose  20 g Oral BID  . multivitamin with minerals  1 tablet Oral Daily  . pantoprazole  40 mg Oral Q0600  . prednisoLONE  40 mg Oral Daily  . rifaximin  550 mg Oral BID  . thiamine  100 mg Oral Daily   Continuous Infusions:   LOS: 9 days    Time spent: 35 mins     Derek Laughter Joline Maxcy, MD [facility] Pager [phone]   If 7PM-7AM, please contact night-coverage [email or web address] Password [email or web address] 08/31/2016, 1:30 PM

## 2016-09-01 LAB — COMPREHENSIVE METABOLIC PANEL
ALT: 111 U/L — ABNORMAL HIGH (ref 17–63)
AST: 133 U/L — AB (ref 15–41)
Albumin: 2.4 g/dL — ABNORMAL LOW (ref 3.5–5.0)
Alkaline Phosphatase: 352 U/L — ABNORMAL HIGH (ref 38–126)
Anion gap: 11 (ref 5–15)
BILIRUBIN TOTAL: 44.3 mg/dL — AB (ref 0.3–1.2)
BUN: 60 mg/dL — AB (ref 6–20)
CALCIUM: 8.5 mg/dL — AB (ref 8.9–10.3)
CO2: 14 mmol/L — ABNORMAL LOW (ref 22–32)
CREATININE: 2.68 mg/dL — AB (ref 0.61–1.24)
Chloride: 123 mmol/L — ABNORMAL HIGH (ref 101–111)
GFR calc Af Amer: 33 mL/min — ABNORMAL LOW (ref >60)
GFR, EST NON AFRICAN AMERICAN: 28 mL/min — AB (ref >60)
Glucose, Bld: 204 mg/dL — ABNORMAL HIGH (ref 65–99)
POTASSIUM: 3.6 mmol/L (ref 3.5–5.1)
Sodium: 148 mmol/L — ABNORMAL HIGH (ref 135–145)
TOTAL PROTEIN: 5 g/dL — AB (ref 6.5–8.1)

## 2016-09-01 LAB — GLUCOSE, CAPILLARY
GLUCOSE-CAPILLARY: 143 mg/dL — AB (ref 65–99)
GLUCOSE-CAPILLARY: 177 mg/dL — AB (ref 65–99)
Glucose-Capillary: 282 mg/dL — ABNORMAL HIGH (ref 65–99)
Glucose-Capillary: 305 mg/dL — ABNORMAL HIGH (ref 65–99)

## 2016-09-01 MED ORDER — HALOPERIDOL LACTATE 2 MG/ML PO CONC
1.0000 mg | Freq: Four times a day (QID) | ORAL | Status: DC | PRN
  Filled 2016-09-01: qty 0.5

## 2016-09-01 MED ORDER — INSULIN ASPART 100 UNIT/ML ~~LOC~~ SOLN
4.0000 [IU] | Freq: Once | SUBCUTANEOUS | Status: AC
  Administered 2016-09-01: 4 [IU] via SUBCUTANEOUS

## 2016-09-01 NOTE — Progress Notes (Signed)
09/01/2016 12:42 PM  Spoke with MD Nelson ChimesAmin in regards to patient foley. New orders written. [employer] [facility] [employer] to discontinue. Will monitor patient for next 6 hours then bladder scan, if no output.   Lan Mcneill The Mutual of OmahaYoung BSN, RN-BC [facility] Phone [phone]

## 2016-09-01 NOTE — Progress Notes (Signed)
PROGRESS NOTE    Tyler Clarke  UEA:[id number] DOB: 1975-09-21 DOA: 08/22/2016 PCP: Kaleen Mask, MD   Brief Narrative:  41 year old male with history of heavy alcohol use, diabetes type 2 came to the ER after having abnormal labs outpatient. Patient was found to be in decompensated liver cirrhosis, new diagnosis secondary to alcohol abuse. He has been getting treatment for acute alcoholic hepatitis and renal failure.   Assessment & Plan:   Active Problems:   Liver failure (HCC)   Alcoholic cirrhosis of liver without ascites (HCC)   Pressure injury of skin   Encephalopathy   Alcoholic hepatitis without ascites   Encephalopathy, hepatic (HCC)  Decompensated liver cirrhosis likely secondary to alcohol use Moderate alcoholic hepatitis -Gastroenterology following. Continue prednisone (day 11 ). Plan to complete 28 day course steroids. Not sure how much his steroids are helping at this point but will continue to complete the course. -Acute hepatitis panel negative. Abdominal ultrasound showed hepatic cirrhosis -Continue lactulose and rifaximin -Status post 1 week of Rocephin treatment -AFP checked on 08/22/2016-normal -Supportive care -Given recent history of alcohol drinking he's not a transplant candidate at this time.  Hepatic encephalopathy, fluctuating -Mentation is little better this morning. Mother is at bedside. -Continue lactulose and rifaximin. Titrate to 3-4 soft bowel movements daily  Hypernatremia -A.m. labs pending -As improved. Encourage oral liquid -Nephrology was following  Thrombocytopenia  -likely secondary to liver disease  Acute kidney injury -A.m. labs pending -Likely secondary to hepatic renal syndrome? -Continue gentle hydration.  Hypoalbuminemia -If continues to third Place fluid then he may need some albumin supplements  Diabetes type 2 -Currently on insulin regimen. Continue at this time  Alcohol abuse -CIWA protocol  Due to  generalized weakness physical therapy and occupational therapy has been ordered.  DVT prophylaxis: SCDs Code Status: Do not recess it. Family Communication:  Mother is at the bedside Disposition Plan: To be determined  Consultants:   Gastroenterology  Nephrology  Procedures:   None  Subjective: Patient is more alert this morning and conversational. Mother is at bedside as well. No complaints but mother does bring up a concern of getting patient an extra help at home as she is already tied up and very busy at her work and taking care of her husband. She does not think patient will be able to take care of himself. No other complaints. I spoke with the case management who will assess and help the patient with the needs after physical therapy has evaluated the patient.  Objective: Vitals:   08/31/16 1749 08/31/16 2046 09/01/16 0738 09/01/16 1000  BP: 92/61 101/63 104/66 104/68  Pulse: 93 94 92 92  Resp: 18 19 18 18   Temp: 98 F (36.7 C) 98.3 F (36.8 C) 98 F (36.7 C) 97.6 F (36.4 C)  TempSrc: Oral Oral Oral Axillary  SpO2: 100% 100% 100% 100%  Weight:  90.3 kg (199 lb 1.2 oz)    Height:        Intake/Output Summary (Last 24 hours) at 09/01/16 1233 Last data filed at 09/01/16 0611  Gross per 24 hour  Intake              240 ml  Output             1100 ml  Net             -860 ml   Filed Weights   08/28/16 0220 08/30/16 2140 08/31/16 2046  Weight: 84 kg (185 lb 3 oz)  90.3 kg (199 lb 1.2 oz) 90.3 kg (199 lb 1.2 oz)    Examination:  General exam: Appears calm and comfortable; AAOx3; Jaundice and scleral icterus Respiratory system: Clear to auscultation. Respiratory effort normal. Cardiovascular system: S1 & S2 heard, RRR. No JVD, murmurs, rubs, gallops or clicks. No pedal edema. Gastrointestinal system: Abdomen is distended but soft and nontender. No organomegaly or masses felt. Normal bowel sounds heard. Central nervous system: Alert and oriented. No focal  neurological deficits. Extremities: Symmetric 4+ x 5 power. Skin: No rashes, lesions or ulcers Psychiatry: Judgement and insight poor Mittens in place as he poses danger to himself - pulling on lines and tube   Data Reviewed:   CBC:  Recent Labs Lab 08/27/16 0215 08/28/16 0224 08/30/16 0715 08/31/16 0344  WBC 34.1* 30.9* 28.1* 29.7*  HGB 11.0* 11.1* 11.7* 10.7*  HCT 33.5* 33.9* 36.3* 33.4*  MCV 112.8* 115.3* 119.0* 116.8*  PLT 142* 126* 73* 70*   Basic Metabolic Panel:  Recent Labs Lab 08/25/16 1235  08/26/16 0238 08/27/16 0215 08/28/16 0224 08/29/16 0324 08/30/16 0715 08/31/16 0344  NA  --   < > 135 144 152* 153* 143 146*  K 3.2*  --  3.2* 3.0* 3.8 4.0 4.8 3.4*  CL  --   < > 104 114* 121* 125* 118* 121*  CO2  --   < > 16* 18* 19* 17* 13* 16*  GLUCOSE  --   < > 185* 73 294* 215* 433* 124*  BUN  --   < > 68* 62* 57* 57* 51* 57*  CREATININE  --   < > 2.36* 2.06* 2.05* 2.00* 2.07* 2.42*  CALCIUM  --   < > 7.1* 7.7* 8.1* 8.3* 7.6* 7.9*  MG 2.9*  --  3.0* 3.1*  --   --   --   --   PHOS  --   --   --   --  4.9*  --   --   --   < > = values in this interval not displayed. GFR: Estimated Creatinine Clearance: 47.6 mL/min (A) (by C-G formula based on SCr of 2.42 mg/dL (H)). Liver Function Tests:  Recent Labs Lab 08/27/16 0215 08/28/16 0224 08/29/16 0324 08/30/16 0715 08/31/16 0344  AST 139* 125* 145* 129* 141*  ALT 61 73* 88* 88* 95*  ALKPHOS 174* 223* 245* 248* 278*  BILITOT 44.0* 43.7* 47.3* 41.3* 43.6*  PROT 5.2* 5.1* 5.0* 4.3* 4.2*  ALBUMIN 2.9* 2.7* 2.7* 2.3* 2.1*   No results for input(s): LIPASE, AMYLASE in the last 168 hours.  Recent Labs Lab 08/26/16 0238 08/27/16 0215 08/28/16 0224 08/29/16 0324 08/30/16 0313  AMMONIA 158* 102* 61* 37* 22   Coagulation Profile:  Recent Labs Lab 08/27/16 1046 08/28/16 0224  INR 1.67 1.74   Cardiac Enzymes: No results for input(s): CKTOTAL, CKMB, CKMBINDEX, TROPONINI in the last 168 hours. BNP (last 3  results) No results for input(s): PROBNP in the last 8760 hours. HbA1C: No results for input(s): HGBA1C in the last 72 hours. CBG:  Recent Labs Lab 08/31/16 1228 08/31/16 1731 08/31/16 2150 09/01/16 0746 09/01/16 1114  GLUCAP 206* 132* 119* 143* 177*   Lipid Profile: No results for input(s): CHOL, HDL, LDLCALC, TRIG, CHOLHDL, LDLDIRECT in the last 72 hours. Thyroid Function Tests: No results for input(s): TSH, T4TOTAL, FREET4, T3FREE, THYROIDAB in the last 72 hours. Anemia Panel: No results for input(s): VITAMINB12, FOLATE, FERRITIN, TIBC, IRON, RETICCTPCT in the last 72 hours. Sepsis Labs: No results for  input(s): PROCALCITON, LATICACIDVEN in the last 168 hours.  Recent Results (from the past 240 hour(s))  Blood culture (routine x 2)     Status: None   Collection Time: 08/22/16  5:40 PM  Result Value Ref Range Status   Specimen Description BLOOD RIGHT ANTECUBITAL  Final   Special Requests   Final    BOTTLES DRAWN AEROBIC AND ANAEROBIC Blood Culture adequate volume   Culture NO GROWTH 5 DAYS  Final   Report Status 08/27/2016 FINAL  Final  Blood culture (routine x 2)     Status: None   Collection Time: 08/22/16  5:49 PM  Result Value Ref Range Status   Specimen Description BLOOD BLOOD LEFT FOREARM  Final   Special Requests   Final    BOTTLES DRAWN AEROBIC AND ANAEROBIC Blood Culture adequate volume   Culture NO GROWTH 5 DAYS  Final   Report Status 08/27/2016 FINAL  Final  MRSA PCR Screening     Status: None   Collection Time: 08/22/16 11:15 PM  Result Value Ref Range Status   MRSA by PCR NEGATIVE NEGATIVE Final    Comment:        The GeneXpert MRSA Assay (FDA approved for NASAL specimens only), is one component of a comprehensive MRSA colonization surveillance program. It is not intended to diagnose MRSA infection nor to guide or monitor treatment for MRSA infections.          Radiology Studies: No results found.      Scheduled Meds: . feeding  supplement (GLUCERNA SHAKE)  237 mL Oral TID BM  . folic acid  1 mg Oral Daily  . insulin aspart  0-15 Units Subcutaneous TID WC  . insulin glargine  18 Units Subcutaneous Daily  . lactulose  20 g Oral BID  . multivitamin with minerals  1 tablet Oral Daily  . pantoprazole  40 mg Oral Q0600  . prednisoLONE  40 mg Oral Daily  . rifaximin  550 mg Oral BID  . thiamine  100 mg Oral Daily   Continuous Infusions:   LOS: 10 days    Time spent: 35 mins     Orean Giarratano Joline Maxcyhirag Retina Bernardy, MD [facility] Pager [phone]   If 7PM-7AM, please contact night-coverage [email or web address] Password [email or web address] 09/01/2016, 12:33 PM

## 2016-09-01 NOTE — Evaluation (Signed)
Physical Therapy Evaluation Patient Details Name: Tyler Clarke MRN: [id number] DOB: 1975-05-26 Today's Date: 09/01/2016   History of Present Illness  Pt is a 41 yo male admitted through ED on 08/22/16 with recent falls, jaundice, ETOH hepatitis, AKI, ascites, encephalopathy and liver faillure. PMH signifcant for heavy alcohol abuse and DM.   Clinical Impression  Pt presents with the above diagnosis and below deficits for therapy evaluation. Prior to admission, pt lived with his parents and was independent. Currently, pt requires Mod A for all mobility and requires increased verbal and tactile cues for mobility and safety. Pt is not safe to return home with his parents as his mother is now caring for his father who just returned from rehab. Pt will require SNF placement at discharge in order to improve his mobility and strength before returning home. Pt continues to require acute follow-up in order to address the below deficits prior to DC.     Follow Up Recommendations SNF;Supervision/Assistance - 24 hour    Equipment Recommendations  Rolling walker with 5" wheels;3in1 (PT)    Recommendations for Other Services       Precautions / Restrictions Precautions Precautions: Fall Restrictions Weight Bearing Restrictions: No      Mobility  Bed Mobility Overal bed mobility: Needs Assistance Bed Mobility: Supine to Sit     Supine to sit: Mod assist;HOB elevated     General bed mobility comments: Mod A to bring LE's off bed and to bring trunk upright at EOB. Pt appears to have diffiuclty with sequencing mobility.   Transfers Overall transfer level: Needs assistance Equipment used: 1 person hand held assist Transfers: Sit to/from UGI CorporationStand;Stand Pivot Transfers Sit to Stand: Mod assist Stand pivot transfers: Mod assist       General transfer comment: Mod A to stand from elevated surface and Mod A to complete pivot transfer with 50% cues for hand placement on  recliner  Ambulation/Gait             General Gait Details: Did not attempt this session  Stairs            Wheelchair Mobility    Modified Rankin (Stroke Patients Only)       Balance Overall balance assessment: Needs assistance;History of Falls Sitting-balance support: No upper extremity supported;Feet supported Sitting balance-Leahy Scale: Fair     Standing balance support: Single extremity supported;During functional activity Standing balance-Leahy Scale: Poor                               Pertinent Vitals/Pain Pain Assessment: No/denies pain    Home Living Family/patient expects to be discharged to:: Unsure Living Arrangements: Parent               Additional Comments: pt lives with his mother who is caring for his father who just returned from rehab and is unable to take pt back.     Prior Function Level of Independence: Independent               Hand Dominance   Dominant Hand: Right    Extremity/Trunk Assessment   Upper Extremity Assessment Upper Extremity Assessment: Defer to OT evaluation    Lower Extremity Assessment Lower Extremity Assessment: Generalized weakness    Cervical / Trunk Assessment Cervical / Trunk Assessment: Normal  Communication   Communication: Receptive difficulties  Cognition Arousal/Alertness: Awake/alert Behavior During Therapy: WFL for tasks assessed/performed Overall Cognitive Status: No family/caregiver present to  determine baseline cognitive functioning                                 General Comments: pt doesn't always answer questions appropriately, unable to follow all commands appropriately.       General Comments      Exercises     Assessment/Plan    PT Assessment Patient needs continued PT services  PT Problem List Decreased strength;Decreased activity tolerance;Decreased balance;Decreased mobility;Decreased cognition;Decreased knowledge of use of  DME;Decreased safety awareness       PT Treatment Interventions DME instruction;Gait training;Stair training;Functional mobility training;Therapeutic activities;Therapeutic exercise;Balance training;Patient/family education    PT Goals (Current goals can be found in the Care Plan section)  Acute Rehab PT Goals Patient Stated Goal: none stated PT Goal Formulation: Patient unable to participate in goal setting Time For Goal Achievement: 09/22/16 Potential to Achieve Goals: Fair    Frequency Min 3X/week   Barriers to discharge Decreased caregiver support      Co-evaluation               AM-PAC PT "6 Clicks" Daily Activity  Outcome Measure Difficulty turning over in bed (including adjusting bedclothes, sheets and blankets)?: A Lot Difficulty moving from lying on back to sitting on the side of the bed? : Total Difficulty sitting down on and standing up from a chair with arms (e.g., wheelchair, bedside commode, etc,.)?: Total Help needed moving to and from a bed to chair (including a wheelchair)?: A Lot Help needed walking in hospital room?: A Lot Help needed climbing 3-5 steps with a railing? : Total 6 Click Score: 9    End of Session Equipment Utilized During Treatment: Gait belt Activity Tolerance: Patient limited by fatigue Patient left: in chair;with call bell/phone within reach;with chair alarm set;with nursing/sitter in room;Other (comment) Chiropractor) Nurse Communication: Mobility status PT Visit Diagnosis: Unsteadiness on feet (R26.81);Muscle weakness (generalized) (M62.81);Repeated falls (R29.6);Difficulty in walking, not elsewhere classified (R26.2)    Time: 1441-1500 PT Time Calculation (min) (ACUTE ONLY): 19 min   Charges:   PT Evaluation $PT Eval Moderate Complexity: 1 Procedure     PT G Codes:        Tyler Clarke PT, DPT  [phone]   Roxy Manns 09/01/2016, 4:42 PM

## 2016-09-02 ENCOUNTER — Inpatient Hospital Stay (HOSPITAL_COMMUNITY): Payer: Medicaid Other

## 2016-09-02 DIAGNOSIS — L8940 Pressure ulcer of contiguous site of back, buttock and hip, unspecified stage: Secondary | ICD-10-CM

## 2016-09-02 LAB — COMPREHENSIVE METABOLIC PANEL
ALBUMIN: 2.2 g/dL — AB (ref 3.5–5.0)
ALK PHOS: 388 U/L — AB (ref 38–126)
ALT: 115 U/L — AB (ref 17–63)
ANION GAP: 12 (ref 5–15)
AST: 137 U/L — AB (ref 15–41)
BUN: 66 mg/dL — ABNORMAL HIGH (ref 6–20)
CALCIUM: 8.5 mg/dL — AB (ref 8.9–10.3)
CO2: 15 mmol/L — AB (ref 22–32)
Chloride: 121 mmol/L — ABNORMAL HIGH (ref 101–111)
Creatinine, Ser: 3.32 mg/dL — ABNORMAL HIGH (ref 0.61–1.24)
GFR calc Af Amer: 25 mL/min — ABNORMAL LOW (ref >60)
GFR calc non Af Amer: 22 mL/min — ABNORMAL LOW (ref >60)
GLUCOSE: 267 mg/dL — AB (ref 65–99)
Potassium: 4 mmol/L (ref 3.5–5.1)
SODIUM: 148 mmol/L — AB (ref 135–145)
Total Bilirubin: 43.2 mg/dL (ref 0.3–1.2)
Total Protein: 4.9 g/dL — ABNORMAL LOW (ref 6.5–8.1)

## 2016-09-02 LAB — URINALYSIS, ROUTINE W REFLEX MICROSCOPIC
GLUCOSE, UA: 50 mg/dL — AB
KETONES UR: NEGATIVE mg/dL
NITRITE: NEGATIVE
Specific Gravity, Urine: 1.005 (ref 1.005–1.030)
Squamous Epithelial / LPF: NONE SEEN
pH: 6 (ref 5.0–8.0)

## 2016-09-02 LAB — CBC
HCT: 36 % — ABNORMAL LOW (ref 39.0–52.0)
Hemoglobin: 11.4 g/dL — ABNORMAL LOW (ref 13.0–17.0)
MCH: 37.7 pg — AB (ref 26.0–34.0)
MCHC: 31.7 g/dL (ref 30.0–36.0)
MCV: 119.2 fL — AB (ref 78.0–100.0)
PLATELETS: 82 10*3/uL — AB (ref 150–400)
RBC: 3.02 MIL/uL — AB (ref 4.22–5.81)
RDW: 14.1 % (ref 11.5–15.5)
WBC: 38 10*3/uL — ABNORMAL HIGH (ref 4.0–10.5)

## 2016-09-02 LAB — GLUCOSE, CAPILLARY
Glucose-Capillary: 217 mg/dL — ABNORMAL HIGH (ref 65–99)
Glucose-Capillary: 186 mg/dL — ABNORMAL HIGH (ref 65–99)
Glucose-Capillary: 163 mg/dL — ABNORMAL HIGH (ref 65–99)
Glucose-Capillary: 128 mg/dL — ABNORMAL HIGH (ref 65–99)

## 2016-09-02 LAB — MAGNESIUM: Magnesium: 2.6 mg/dL — ABNORMAL HIGH (ref 1.7–2.4)

## 2016-09-02 LAB — AMMONIA: AMMONIA: 124 umol/L — AB (ref 9–35)

## 2016-09-02 MED ORDER — OCTREOTIDE ACETATE 100 MCG/ML IJ SOLN
200.0000 ug | Freq: Three times a day (TID) | INTRAMUSCULAR | Status: AC
  Administered 2016-09-02 – 2016-09-03 (×3): 200 ug via SUBCUTANEOUS
  Filled 2016-09-02 (×4): qty 2

## 2016-09-02 MED ORDER — LACTULOSE ENEMA
300.0000 mL | Freq: Once | ORAL | Status: AC
  Administered 2016-09-02: 300 mL via RECTAL
  Filled 2016-09-02: qty 300

## 2016-09-02 MED ORDER — MIDODRINE HCL 5 MG PO TABS: 5.0000 mg | ORAL_TABLET | Freq: Three times a day (TID) | ORAL | Status: AC

## 2016-09-02 MED ORDER — METRONIDAZOLE IN NACL 5-0.79 MG/ML-% IV SOLN
500.0000 mg | Freq: Three times a day (TID) | INTRAVENOUS | Status: DC
  Administered 2016-09-02 – 2016-09-06 (×12): 500 mg via INTRAVENOUS
  Filled 2016-09-02 (×13): qty 100

## 2016-09-02 MED ORDER — VANCOMYCIN HCL 10 G IV SOLR
1500.0000 mg | Freq: Once | INTRAVENOUS | Status: AC
  Administered 2016-09-02: 1500 mg via INTRAVENOUS
  Filled 2016-09-02: qty 1500

## 2016-09-02 MED ORDER — CEFTAZIDIME 1 G IJ SOLR
1.0000 g | Freq: Two times a day (BID) | INTRAMUSCULAR | Status: DC
  Administered 2016-09-02 (×2): 1 g via INTRAVENOUS
  Filled 2016-09-02 (×3): qty 1

## 2016-09-02 MED ORDER — ALBUMIN HUMAN 25 % IV SOLN
25.0000 g | Freq: Four times a day (QID) | INTRAVENOUS | Status: AC
  Administered 2016-09-02 – 2016-09-05 (×12): 25 g via INTRAVENOUS
  Filled 2016-09-02 (×6): qty 100
  Filled 2016-09-02: qty 50
  Filled 2016-09-02: qty 100
  Filled 2016-09-02: qty 50
  Filled 2016-09-02: qty 100
  Filled 2016-09-02 (×2): qty 50
  Filled 2016-09-02 (×2): qty 100
  Filled 2016-09-02: qty 50

## 2016-09-02 NOTE — Progress Notes (Signed)
Pharmacy Antibiotic Note  Tyler Clarke is a 41 y.o. male admitted on 08/22/2016 with acute alcoholic hepatitis with cirrhosis and ascites. Now with new concerns for sepsis and pharmacy consulted to start Vancomycin + Ceftazidime + Flagyl for empiric coverage.   The patient is noted to have worsening renal function over the past several days, 2.07 >> 2.42 >> 2.68 >> 3.32. CrCl is hard to estimate at this time.   Plan: 1. Vancomycin 1500 mg IV x 1 to load 2. Will obtain a VR ~24h after the initial dose to ensure clearing prior to entering scheduled doses 3. Ceftazidime 1g IV every 12 hours  4. Flagyl 500 mg IV every 8 hours 5. Will continue to follow renal function, culture results, LOT, and antibiotic de-escalation plans   Height: 6\' 2"  (188 cm) Weight: 206 lb (93.4 kg) IBW/kg (Calculated) : 82.2  Temp (24hrs), Avg:97.8 F (36.6 C), Min:97.4 F (36.3 C), Max:98 F (36.7 C)   Recent Labs Lab 08/27/16 0215 08/28/16 0224 08/29/16 0324 08/30/16 0715 08/31/16 0344 09/01/16 1211 09/02/16 0337  WBC 34.1* 30.9*  --  28.1* 29.7*  --  38.0*  CREATININE 2.06* 2.05* 2.00* 2.07* 2.42* 2.68* 3.32*    Estimated Creatinine Clearance: 34.7 mL/min (A) (by C-G formula based on SCr of 3.32 mg/dL (H)).    No Known Allergies  Antimicrobials this admission: Rocephin 6/19 >> 6/25 Vancomycin 6/25 >> Ceftazidime 6/25 >> Flagyl 6/25 >>  Microbiology results: 6/19 BCx >> NGf 6/30 BCx >>  Thank you for allowing pharmacy to be a part of this patient's care.  Georgina PillionElizabeth Loveah Like, PharmD, BCPS Clinical Pharmacist Clinical phone for 09/02/2016 from 7a-3:30p: [phone] If after 3:30p, please call main pharmacy at: [phone] 09/02/2016 1:54 PM

## 2016-09-02 NOTE — Progress Notes (Signed)
NT suctioned moderate amount of thick orange secretions.  Pt tolerated well. Weak cough.

## 2016-09-02 NOTE — Progress Notes (Signed)
Pt's total bilirubin is 43.2, this nurse paged MD to make aware.   Larey Dayshristy M Gaetan Spieker, RN

## 2016-09-02 NOTE — Progress Notes (Addendum)
PROGRESS NOTE    Tyler Clarke  EXB:[id number] DOB: 09/02/1975 DOA: 08/22/2016 PCP: Kaleen MaskElkins, Wilson Oliver, MD   Brief Narrative:  41 year old male with history of heavy alcohol use, diabetes type 2 came to the ER after having abnormal labs outpatient. Patient was found to be in decompensated liver cirrhosis, new diagnosis secondary to alcohol abuse. He has been getting treatment for acute alcoholic hepatitis and renal failure.   Assessment & Plan:   Active Problems:   Liver failure (HCC)   Alcoholic cirrhosis of liver without ascites (HCC)   Pressure injury of skin   Encephalopathy   Alcoholic hepatitis without ascites   Encephalopathy, hepatic (HCC)  Decompensated liver cirrhosis likely secondary to alcohol use Moderate alcoholic hepatitis -Gastroenterology following. Continue prednisone (day 12 ). Plan to complete 28 day course steroids. Not sure how much his steroids are helping at this point but will continue to complete the course. -Acute hepatitis panel negative. Abdominal ultrasound showed hepatic cirrhosis -Continue lactulose and rifaximin -Status post 1 week of Rocephin treatment. But due to worsening mental status and leukocytosis we will restart broad-spectrum antibiotics vancomycin and Zosyn (changed to Ceftazidime and flagyl per pharm recs) -AFP checked on 08/22/2016-normal -Supportive care -Given recent history of alcohol drinking he's not a transplant candidate at this time. -We'll order limited ultrasound to check for ascites  Update at 530pm Abd US showed Mod to large ascites. Will order for paracentesis tomorrow with labs to be sent for analysis. NPO PMN  Hepatic encephalopathy, confused today -Worsening of mental status likely from hepatic encephalopathy versus underlying infection. Check Ammonia. Ordered Rectal Lactulose. Increase freq as needed.  -Check UA, chest x-ray and repeat blood cultures. Will start patient on vancomycin and Zosyn -Keep him nothing by  mouth while mental status improves  Acute kidney injury -Concerns for hepatorenal syndrome type I -Was improving but now worsening again therefore I will restart albumin, octreotide and midodrine. Avoid nephrotoxic drugs and monitor creatinine. Discuss with Dr. Hermelinda MedicusSchwartz from nephrology, appreciate his input  Abnormal respiratory sounds -Although he is saturating well I hear some coarse breath sounds. -Order chest x-ray  Hypernatremia -As improved. Encourage oral liquid  Thrombocytopenia  -likely secondary to liver disease  Hypoalbuminemia -Albumin supplements added due to HRS type I  Diabetes type 2 -Currently on insulin regimen. Continue at this time  Alcohol abuse -CIWA protocol  Due to generalized weakness physical therapy and occupational therapy has been ordered.  DVT prophylaxis: SCDs Code Status: Do not recess it. Family Communication:  Mother is at the bedside Disposition Plan: To be determined  Consultants:   Gastroenterology  Nephrology  Procedures:   None  Subjective: Patient appears to be more confused today and has audible coarse breath sounds. He remains afebrile. Mother is at bedside  Objective: Vitals:   09/01/16 1000 09/01/16 2138 09/02/16 0518 09/02/16 0915  BP: 104/68 (!) 95/59 103/63 (!) 98/59  Pulse: 92 90 83 92  Resp: 18 16 17 19   Temp: 97.6 F (36.4 C) 97.9 F (36.6 C) 98 F (36.7 C) 97.4 F (36.3 C)  TempSrc: Axillary Axillary Axillary Oral  SpO2: 100% 100% 98% 92%  Weight:  93.4 kg (206 lb)    Height:        Intake/Output Summary (Last 24 hours) at 09/02/16 1331 Last data filed at 09/02/16 1031  Gross per 24 hour  Intake              240 ml  Output  315 ml  Net              -75 ml   Filed Weights   08/30/16 2140 08/31/16 2046 09/01/16 2138  Weight: 90.3 kg (199 lb 1.2 oz) 90.3 kg (199 lb 1.2 oz) 93.4 kg (206 lb)    Examination:  General exam: Drowsy and confused; Jaundice and scleral icterus Respiratory  system: Clear to auscultation. Respiratory effort normal. Cardiovascular system: S1 & S2 heard, RRR. No JVD, murmurs, rubs, gallops or clicks. No pedal edema. Gastrointestinal system: Abdomen is distended but soft and nontender. No organomegaly or masses felt. Normal bowel sounds heard. Central nervous system: Alert and oriented. No focal neurological deficits. Extremities: Symmetric 4+ x 5 power. Skin: No rashes, lesions or ulcers Psychiatry: Judgement and insight poor Mittens in place as he poses danger to himself - pulling on lines and tube   Data Reviewed:   CBC:  Recent Labs Lab 08/27/16 0215 08/28/16 0224 08/30/16 0715 08/31/16 0344 09/02/16 0337  WBC 34.1* 30.9* 28.1* 29.7* 38.0*  HGB 11.0* 11.1* 11.7* 10.7* 11.4*  HCT 33.5* 33.9* 36.3* 33.4* 36.0*  MCV 112.8* 115.3* 119.0* 116.8* 119.2*  PLT 142* 126* 73* 70* 82*   Basic Metabolic Panel:  Recent Labs Lab 08/27/16 0215 08/28/16 0224 08/29/16 0324 08/30/16 0715 08/31/16 0344 09/01/16 1211 09/02/16 0337  NA 144 152* 153* 143 146* 148* 148*  K 3.0* 3.8 4.0 4.8 3.4* 3.6 4.0  CL 114* 121* 125* 118* 121* 123* 121*  CO2 18* 19* 17* 13* 16* 14* 15*  GLUCOSE 73 294* 215* 433* 124* 204* 267*  BUN 62* 57* 57* 51* 57* 60* 66*  CREATININE 2.06* 2.05* 2.00* 2.07* 2.42* 2.68* 3.32*  CALCIUM 7.7* 8.1* 8.3* 7.6* 7.9* 8.5* 8.5*  MG 3.1*  --   --   --   --   --  2.6*  PHOS  --  4.9*  --   --   --   --   --    GFR: Estimated Creatinine Clearance: 34.7 mL/min (A) (by C-G formula based on SCr of 3.32 mg/dL (H)). Liver Function Tests:  Recent Labs Lab 08/29/16 0324 08/30/16 0715 08/31/16 0344 09/01/16 1211 09/02/16 0337  AST 145* 129* 141* 133* 137*  ALT 88* 88* 95* 111* 115*  ALKPHOS 245* 248* 278* 352* 388*  BILITOT 47.3* 41.3* 43.6* 44.3* 43.2*  PROT 5.0* 4.3* 4.2* 5.0* 4.9*  ALBUMIN 2.7* 2.3* 2.1* 2.4* 2.2*   No results for input(s): LIPASE, AMYLASE in the last 168 hours.  Recent Labs Lab 08/27/16 0215  08/28/16 0224 08/29/16 0324 08/30/16 0313  AMMONIA 102* 61* 37* 22   Coagulation Profile:  Recent Labs Lab 08/27/16 1046 08/28/16 0224  INR 1.67 1.74   Cardiac Enzymes: No results for input(s): CKTOTAL, CKMB, CKMBINDEX, TROPONINI in the last 168 hours. BNP (last 3 results) No results for input(s): PROBNP in the last 8760 hours. HbA1C: No results for input(s): HGBA1C in the last 72 hours. CBG:  Recent Labs Lab 09/01/16 1114 09/01/16 1712 09/01/16 2133 09/02/16 0752 09/02/16 1202  GLUCAP 177* 282* 305* 217* 186*   Lipid Profile: No results for input(s): CHOL, HDL, LDLCALC, TRIG, CHOLHDL, LDLDIRECT in the last 72 hours. Thyroid Function Tests: No results for input(s): TSH, T4TOTAL, FREET4, T3FREE, THYROIDAB in the last 72 hours. Anemia Panel: No results for input(s): VITAMINB12, FOLATE, FERRITIN, TIBC, IRON, RETICCTPCT in the last 72 hours. Sepsis Labs: No results for input(s): PROCALCITON, LATICACIDVEN in the last 168 hours.  No results found  for this or any previous visit (from the past 240 hour(s)).       Radiology Studies: No results found.      Scheduled Meds: . feeding supplement (GLUCERNA SHAKE)  237 mL Oral TID BM  . folic acid  1 mg Oral Daily  . insulin aspart  0-15 Units Subcutaneous TID WC  . insulin glargine  18 Units Subcutaneous Daily  . lactulose  20 g Oral BID  . multivitamin with minerals  1 tablet Oral Daily  . pantoprazole  40 mg Oral Q0600  . prednisoLONE  40 mg Oral Daily  . rifaximin  550 mg Oral BID  . thiamine  100 mg Oral Daily   Continuous Infusions:   LOS: 11 days    Time spent: 35 mins     Chirstina Haan Joline Maxcy, MD [facility] Pager [phone]   If 7PM-7AM, please contact night-coverage [email or web address] Password [email or web address] 09/02/2016, 1:31 PM

## 2016-09-03 DIAGNOSIS — R4182 Altered mental status, unspecified: Secondary | ICD-10-CM

## 2016-09-03 DIAGNOSIS — Z515 Encounter for palliative care: Secondary | ICD-10-CM

## 2016-09-03 DIAGNOSIS — N179 Acute kidney failure, unspecified: Secondary | ICD-10-CM

## 2016-09-03 DIAGNOSIS — Z7189 Other specified counseling: Secondary | ICD-10-CM

## 2016-09-03 LAB — COMPREHENSIVE METABOLIC PANEL
ALK PHOS: 317 U/L — AB (ref 38–126)
ALT: 108 U/L — ABNORMAL HIGH (ref 17–63)
ANION GAP: 12 (ref 5–15)
AST: 133 U/L — ABNORMAL HIGH (ref 15–41)
Albumin: 3.1 g/dL — ABNORMAL LOW (ref 3.5–5.0)
BILIRUBIN TOTAL: 46.2 mg/dL — AB (ref 0.3–1.2)
BUN: 80 mg/dL — ABNORMAL HIGH (ref 6–20)
CALCIUM: 8.6 mg/dL — AB (ref 8.9–10.3)
CO2: 16 mmol/L — AB (ref 22–32)
CREATININE: 4.2 mg/dL — AB (ref 0.61–1.24)
Chloride: 122 mmol/L — ABNORMAL HIGH (ref 101–111)
GFR calc non Af Amer: 16 mL/min — ABNORMAL LOW (ref >60)
GFR, EST AFRICAN AMERICAN: 19 mL/min — AB (ref >60)
GLUCOSE: 125 mg/dL — AB (ref 65–99)
Potassium: 4.4 mmol/L (ref 3.5–5.1)
SODIUM: 150 mmol/L — AB (ref 135–145)
Total Protein: 5 g/dL — ABNORMAL LOW (ref 6.5–8.1)

## 2016-09-03 LAB — CBC
HCT: 33 % — ABNORMAL LOW (ref 39.0–52.0)
Hemoglobin: 10.6 g/dL — ABNORMAL LOW (ref 13.0–17.0)
MCH: 37.7 pg — AB (ref 26.0–34.0)
MCHC: 32.1 g/dL (ref 30.0–36.0)
MCV: 117.4 fL — AB (ref 78.0–100.0)
PLATELETS: 92 10*3/uL — AB (ref 150–400)
RBC: 2.81 MIL/uL — ABNORMAL LOW (ref 4.22–5.81)
RDW: 13.8 % (ref 11.5–15.5)
WBC: 31.4 10*3/uL — AB (ref 4.0–10.5)

## 2016-09-03 LAB — GLUCOSE, CAPILLARY
Glucose-Capillary: 102 mg/dL — ABNORMAL HIGH (ref 65–99)
Glucose-Capillary: 101 mg/dL — ABNORMAL HIGH (ref 65–99)
Glucose-Capillary: 96 mg/dL (ref 65–99)
Glucose-Capillary: 85 mg/dL (ref 65–99)

## 2016-09-03 LAB — MAGNESIUM: Magnesium: 2.8 mg/dL — ABNORMAL HIGH (ref 1.7–2.4)

## 2016-09-03 MED ORDER — GLYCOPYRROLATE 0.2 MG/ML IJ SOLN
0.2000 mg | INTRAMUSCULAR | Status: DC | PRN
  Filled 2016-09-03: qty 1

## 2016-09-03 MED ORDER — FENTANYL CITRATE (PF) 100 MCG/2ML IJ SOLN
25.0000 ug | INTRAMUSCULAR | Status: DC | PRN
  Administered 2016-09-06: 25 ug via INTRAVENOUS
  Filled 2016-09-03: qty 2

## 2016-09-03 MED ORDER — DEXTROSE 5 % IV SOLN
2.0000 g | INTRAVENOUS | Status: DC
  Administered 2016-09-03 – 2016-09-05 (×3): 2 g via INTRAVENOUS
  Filled 2016-09-03 (×4): qty 2

## 2016-09-03 MED ORDER — LACTULOSE ENEMA
300.0000 mL | ORAL | Status: AC | PRN
  Administered 2016-09-03 (×2): 300 mL via RECTAL
  Filled 2016-09-03 (×3): qty 300

## 2016-09-03 NOTE — Progress Notes (Signed)
Report called to Pulte HomesJennifer RN of [facility]. Pt is being transferred to higher level of care on stepdown. Pts belongings transferred with pt to [id number].

## 2016-09-03 NOTE — Progress Notes (Signed)
OT Cancellation Note  Patient Details Name: Tyler Clarke MRN: [id number] DOB: 01/18/1976   Cancelled Treatment:    Reason Eval/Treat Not Completed: Medical issues which prohibited therapy. Pt with rapid decline in status, transferred to SDU this morning. Per RN; palliative now involved. Will hold OT today and follow up as pt is appropriate and time allows.  Gaye AlkenBailey A Rosemond Lyttle M.S., OTR/L Pager: [phone]  09/03/2016, 11:15 AM

## 2016-09-03 NOTE — Progress Notes (Signed)
PROGRESS NOTE    Tyler Clarke  ZOX:[id number] DOB: March 03, 1976 DOA: 08/22/2016 PCP: Kaleen Mask, MD   Brief Narrative:  41 year old male with history of heavy alcohol use, diabetes type 2 came to the ER after having abnormal labs outpatient. Patient was found to be in decompensated liver cirrhosis, new diagnosis secondary to alcohol abuse. He has been getting treatment for acute alcoholic hepatitis and renal failure. After a few days of treatment with lactulose, octreotide and albumin patient's mental status was improving but his alcohol hepatitis still continued to worsen. On 09/03/2016 due to failure of improvement in his mental status and worsening renal function, hepatic function and low blood pressure he was transferred to stepdown unit.   Assessment & Plan:   Active Problems:   Liver failure (HCC)   Alcoholic cirrhosis of liver without ascites (HCC)   Pressure injury of skin   Encephalopathy   Alcoholic hepatitis without ascites   Encephalopathy, hepatic (HCC)  Decompensated liver cirrhosis likely secondary to alcohol use Moderate alcoholic hepatitis; worsening  -Gastroenterology recs noted. Continue prednisone (day 13 ). Plan to complete 28 day course steroids. Not sure how much his steroids are helping at this point but will continue to complete the course. -Acute hepatitis panel negative. Abdominal ultrasound showed hepatic cirrhosis. Repeat Limited US now shows mod to large ascites but unable to get paracentesis today due to his condition -Continue lactulose and rifaximin. Rectal lactulose  -Status post 1 week of Rocephin treatment. But due to worsening mental status and leukocytosis we will restart broad-spectrum antibiotics vancomycin D2; Ceftazidime D2 and flagyl D2 per pharm recs) -AFP checked on 08/22/2016-normal -Supportive care -Given recent history of alcohol drinking he's not a transplant candidate at this time.  Hepatic encephalopathy,  Confused -Underlying infections and/or worsening encephalopathy  -Possible mild urinary tract infection. Chest x-ray does not show any significant changes or acute pulmonary pathology. Concerned about possible SBP. -At this time continue rectal lactulose until his mental status improves. Continue IV antibiotics as mentioned above. -Keep him nothing by mouth while mental status improves  Acute kidney injury Hepatorenal syndrome type I -Currently is on IV albumin and octreotide. Will also administer Midodrine 3 times daily once his mental status improves. Creatinine continues to worsen. Closely monitor urine output, Foley in place -Due to borderline low probe pressure, case discussed with intensive care physician Dr. Marylouise Stacks this morning and agrees due to patient's futile condition we will manage it with above-mentioned medication without any pressors at this time. Repeat blood pressure shows some improvement with systolic of 104/40 heart rate in 70s.  Hypernatremia -Elevated sodium at 150. Encourage oral liquid. Ideally would stop lactulose but at this time he needs it given his mental status  Thrombocytopenia  -likely secondary to liver disease  Hypoalbuminemia -Albumin supplements added due to HRS type I  Diabetes type 2 -Currently on insulin regimen. Continue at this time  Alcohol abuse -CIWA protocol  As he was requiring increasing nursing care, worsening mental status and poor blood pressure he was transferred to stepdown unit this morning.  Patient's condition remains very critical and given elevated meld score, maddrey score, worsening liver function, worsening renal function secondary to hepatorenal syndrome despite of maximizing medical treatment he continues to worsen. His prognosis remains very poor. Case discussed with gastroenterology, intensive care physician. I will also called palliative care this morning to help establish goals of care. Discussed with patient's mother who  was at bedside.  DVT prophylaxis: SCDs Code Status: Do not  recess it. Family Communication:  Mother is at the bedside Disposition Plan: To be determined  Consultants:   Gastroenterology  Nephrology  Palliative care   Critical Care  Procedures:   None  Subjective: No acute events overnight but patient mental status still remains the same. He still remains drowsy/lethargic. Minimally responsive. Remains afebrile. Due to soft blood pressure this morning and worsening of his hepatic and renal function it was determined he should be transferred to stepdown unit.  I will also discuss this case with the intensive care physician, Dr Marylouise Stacks who agrees patient does not need pressors and will treat him with above-mentioned medical treatment.  Objective: Vitals:   09/02/16 2102 09/03/16 0516 09/03/16 0748 09/03/16 0855  BP:  (!) 90/57 (!) 87/56 112/72  Pulse:  75 81 77  Resp:  16 16 (!) 27  Temp:  98 F (36.7 C) 97.5 F (36.4 C) 97.4 F (36.3 C)  TempSrc:  Axillary Axillary Oral  SpO2: 98% 100% 100% 98%  Weight:    90.3 kg (199 lb)  Height:    6\' 4"  (1.93 m)    Intake/Output Summary (Last 24 hours) at 09/03/16 1038 Last data filed at 09/03/16 0520  Gross per 24 hour  Intake              350 ml  Output              175 ml  Net              175 ml   Filed Weights   08/31/16 2046 09/01/16 2138 09/03/16 0855  Weight: 90.3 kg (199 lb 1.2 oz) 93.4 kg (206 lb) 90.3 kg (199 lb)    Examination:  General exam: Drowsy and confused AAOX0; Jaundice and scleral icterus Respiratory system: Clear to auscultation. Respiratory effort normal. Cardiovascular system: S1 & S2 heard, RRR. No JVD, murmurs, rubs, gallops or clicks. No pedal edema. Gastrointestinal system: Abdomen is distended but soft and nontender. No organomegaly or masses felt. Normal bowel sounds heard. Central nervous system: Alert and oriented. No focal neurological deficits. Extremities: Symmetric 4+ x 5 power. Skin:  No rashes, lesions or ulcers Psychiatry: Judgement and insight poor Mittens in place as he poses danger to himself - pulling on lines and tube   Data Reviewed:   CBC:  Recent Labs Lab 08/28/16 0224 08/30/16 0715 08/31/16 0344 09/02/16 0337 09/03/16 0455  WBC 30.9* 28.1* 29.7* 38.0* 31.4*  HGB 11.1* 11.7* 10.7* 11.4* 10.6*  HCT 33.9* 36.3* 33.4* 36.0* 33.0*  MCV 115.3* 119.0* 116.8* 119.2* 117.4*  PLT 126* 73* 70* 82* 92*   Basic Metabolic Panel:  Recent Labs Lab 08/28/16 0224  08/30/16 0715 08/31/16 0344 09/01/16 1211 09/02/16 0337 09/03/16 0455  NA 152*  < > 143 146* 148* 148* 150*  K 3.8  < > 4.8 3.4* 3.6 4.0 4.4  CL 121*  < > 118* 121* 123* 121* 122*  CO2 19*  < > 13* 16* 14* 15* 16*  GLUCOSE 294*  < > 433* 124* 204* 267* 125*  BUN 57*  < > 51* 57* 60* 66* 80*  CREATININE 2.05*  < > 2.07* 2.42* 2.68* 3.32* 4.20*  CALCIUM 8.1*  < > 7.6* 7.9* 8.5* 8.5* 8.6*  MG  --   --   --   --   --  2.6* 2.8*  PHOS 4.9*  --   --   --   --   --   --   < > =  values in this interval not displayed. GFR: Estimated Creatinine Clearance: 29 mL/min (A) (by C-G formula based on SCr of 4.2 mg/dL (H)). Liver Function Tests:  Recent Labs Lab 08/30/16 0715 08/31/16 0344 09/01/16 1211 09/02/16 0337 09/03/16 0455  AST 129* 141* 133* 137* 133*  ALT 88* 95* 111* 115* 108*  ALKPHOS 248* 278* 352* 388* 317*  BILITOT 41.3* 43.6* 44.3* 43.2* 46.2*  PROT 4.3* 4.2* 5.0* 4.9* 5.0*  ALBUMIN 2.3* 2.1* 2.4* 2.2* 3.1*   No results for input(s): LIPASE, AMYLASE in the last 168 hours.  Recent Labs Lab 08/28/16 0224 08/29/16 0324 08/30/16 0313 09/02/16 1450  AMMONIA 61* 37* 22 124*   Coagulation Profile:  Recent Labs Lab 08/27/16 1046 08/28/16 0224  INR 1.67 1.74   Cardiac Enzymes: No results for input(s): CKTOTAL, CKMB, CKMBINDEX, TROPONINI in the last 168 hours. BNP (last 3 results) No results for input(s): PROBNP in the last 8760 hours. HbA1C: No results for input(s):  HGBA1C in the last 72 hours. CBG:  Recent Labs Lab 09/02/16 0752 09/02/16 1202 09/02/16 1719 09/02/16 2023 09/03/16 0747  GLUCAP 217* 186* 163* 128* 102*   Lipid Profile: No results for input(s): CHOL, HDL, LDLCALC, TRIG, CHOLHDL, LDLDIRECT in the last 72 hours. Thyroid Function Tests: No results for input(s): TSH, T4TOTAL, FREET4, T3FREE, THYROIDAB in the last 72 hours. Anemia Panel: No results for input(s): VITAMINB12, FOLATE, FERRITIN, TIBC, IRON, RETICCTPCT in the last 72 hours. Sepsis Labs: No results for input(s): PROCALCITON, LATICACIDVEN in the last 168 hours.  No results found for this or any previous visit (from the past 240 hour(s)).       Radiology Studies: [place] Abdomen Limited  Result Date: 09/02/2016 CLINICAL DATA:  Ascites. EXAM: LIMITED ABDOMEN ULTRASOUND FOR ASCITES TECHNIQUE: Limited ultrasound survey for ascites was performed in all four abdominal quadrants. COMPARISON:  08/22/2016 FINDINGS: Increasing ascites since the prior study with moderate to large volume ascites, most pronounced in the right abdomen and left lower quadrant. IMPRESSION: Moderate to large volume ascites. Electronically Signed   By: Charlett NoseKevin  Dover M.D.   On: 09/02/2016 16:14   Dg Chest Port 1 View  Result Date: 09/02/2016 CLINICAL DATA:  Dyspnea, diabetes mellitus, smoker EXAM: PORTABLE CHEST 1 VIEW COMPARISON:  Portable exam at 1351 hrs compared to 08/30/2016 FINDINGS: Normal heart size, mediastinal contours, and pulmonary vascularity. Lungs clear. No pleural effusion or pneumothorax. Old fracture posterolateral RIGHT seventh rib. Bones otherwise unremarkable. IMPRESSION: No acute abnormalities. Electronically Signed   By: Ulyses SouthwardMark  Boles M.D.   On: 09/02/2016 14:20        Scheduled Meds: . feeding supplement (GLUCERNA SHAKE)  237 mL Oral TID BM  . folic acid  1 mg Oral Daily  . insulin aspart  0-15 Units Subcutaneous TID WC  . insulin glargine  18 Units Subcutaneous Daily  . lactulose   20 g Oral BID  . midodrine  5 mg Oral TID WC  . multivitamin with minerals  1 tablet Oral Daily  . octreotide  200 mcg Subcutaneous TID  . pantoprazole  40 mg Oral Q0600  . prednisoLONE  40 mg Oral Daily  . rifaximin  550 mg Oral BID  . thiamine  100 mg Oral Daily   Continuous Infusions: . albumin human    . cefTAZidime (FORTAZ)  IV Stopped (09/02/16 2355)  . metronidazole Stopped (09/03/16 0606)     LOS: 12 days    Time spent: 35 mins     Ankit Joline Maxcyhirag Amin, MD [facility] Pager  [phone]   If 7PM-7AM, please contact night-coverage [email or web address] Password [email or web address] 09/03/2016, 10:38 AM

## 2016-09-03 NOTE — Progress Notes (Signed)
Patient ID: Tyler Clarke, male   DOB: 24-Nov-1975, 41 y.o.   MRN: [id number]  Received call from hospitalist Dr Nelson Chimes regarding consideration for need for pressors and transfer to ICU.  Patient seen and examined. He is altered and unable to give history  Blood pressure 112/72, pulse 77, temperature 97.4 F (36.3 C), temperature source Oral, resp. rate (!) 27, height 6\' 4"  (1.93 m), weight 90.3 kg (199 lb), SpO2 98 %.  Examination:  General exam: Drowsy and confused; Jaundice and scleral icterus Respiratory system: Clear to auscultation. Respiratory effort normal. Cardiovascular system: S1 & S2 heard, RRR. No JVD, murmurs, rubs, gallops or clicks. No pedal edema. Gastrointestinal system: Abdomen is distended but soft and nontender. No organomegaly or masses felt. Normal bowel sounds heard. Central nervous system: Alert and oriented. No focal neurological deficits. Extremities: Symmetric 4+ x 5 power. Skin: No rashes, lesions or ulcers Psychiatry: Judgement and insight poor Mittens in place as he poses danger to himself - pulling on lines and tube  Labs and imaging reviewed   Current Facility-Administered Medications:  .  albumin human 25 % solution 25 g, 25 g, Intravenous, Q6H, Amin, Ankit Chirag, MD, 25 g at 09/03/16 4098 .  cefTAZidime (FORTAZ) 1 g in dextrose 5 % 50 mL IVPB, 1 g, Intravenous, Q12H, Ann Held, [facility], Stopped at 09/02/16 2355 .  feeding supplement (GLUCERNA SHAKE) (GLUCERNA SHAKE) liquid 237 mL, 237 mL, Oral, TID BM, Simpson, Paula B, NP, 237 mL at 09/02/16 1019 .  folic acid (FOLVITE) tablet 1 mg, 1 mg, Oral, Daily, Jenita Seashore, RPH, 1 mg at 09/02/16 1016 .  haloperidol (HALDOL) 2 MG/ML solution 1 mg, 1 mg, Oral, Q6H PRN, Amin, Ankit Chirag, MD .  insulin aspart (novoLOG) injection 0-15 Units, 0-15 Units, Subcutaneous, TID WC, Lonia Blood, MD, 5 Units at 09/02/16 0830 .  insulin glargine (LANTUS) injection 18 Units, 18 Units, Subcutaneous, Daily,  Elgergawy, Leana Roe, MD, 18 Units at 09/02/16 1016 .  ipratropium-albuterol (DUONEB) 0.5-2.5 (3) MG/3ML nebulizer solution 3 mL, 3 mL, Nebulization, Q4H PRN, Selmer Dominion B, NP, 3 mL at 08/24/16 0828 .  lactulose (CHRONULAC) 10 GM/15ML solution 20 g, 20 g, Oral, BID, Dianah Field, PA-C, 20 g at 09/02/16 1016 .  lactulose (CHRONULAC) enema 200 gm, 300 mL, Rectal, Q4H PRN, Amin, Ankit Chirag, MD .  LORazepam (ATIVAN) injection 1-2 mg, 1-2 mg, Intravenous, Q1H PRN, Sherron Monday, RPH, 2 mg at 09/02/16 0853 .  metroNIDAZOLE (FLAGYL) IVPB 500 mg, 500 mg, Intravenous, Q8H, Amin, Loura Halt, MD, Stopped at 09/03/16 0606 .  midodrine (PROAMATINE) tablet 5 mg, 5 mg, Oral, TID WC, Amin, Ankit Chirag, MD .  multivitamin with minerals tablet 1 tablet, 1 tablet, Oral, Daily, Karl Ito, MD, 1 tablet at 09/02/16 1016 .  octreotide (SANDOSTATIN) injection 200 mcg, 200 mcg, Subcutaneous, TID, Amin, Ankit Chirag, MD, 200 mcg at 09/02/16 2139 .  pantoprazole (PROTONIX) EC tablet 40 mg, 40 mg, Oral, Q0600, Dianah Field, PA-C, 40 mg at 09/01/16 0856 .  prednisoLONE tablet 40 mg, 40 mg, Oral, Daily, Dianah Field, PA-C, 40 mg at 09/02/16 1016 .  rifaximin (XIFAXAN) tablet 550 mg, 550 mg, Oral, BID, Drema Dallas, MD, 550 mg at 09/02/16 1016 .  thiamine (VITAMIN B-1) tablet 100 mg, 100 mg, Oral, Daily, Jenita Seashore, RPH, 100 mg at 09/02/16 1016   A/P  Active Problems:   Liver failure (HCC)   Alcoholic cirrhosis of liver without ascites (  HCC)   Pressure injury of skin   Encephalopathy   Alcoholic hepatitis without ascites   Encephalopathy, hepatic (HCC)  Leucocytosis Hypernatremia - per Renal team HRS-1 Hyperammonemia - on lactulose and rifaximin Hyperbilirubinemia - Total Bili 43 today  CXR clear  Chart reviewed. He has severe alcoholic liver disease, not a transplant candidate due to active alcohol use. GI has signed off the patient. He is on steroids for elevated discriminant  fraction. He has hepatic encephalopathy and going to get lactulose rectally since he is not awake enough to take it po. Also discussed case briefly with dr Bary RichardFienstein who has previously seen the patient.  Last BP is 112/72. I dont think he needs pressors for that BP. Also over all he seems to be candidate for consideration for palliative care/hospice. Aggressive therapy likely futile at this stage.  Completed 1 week of rocephin for SBP and UTI. Paracentesis along with albumin infusion may help renal perfusion and take some pressure off IVC. Reviewed note of Dr Nelson ChimesAmin from today and agree with the current treatment plan. No further recs. Call us again if we can be of any help.

## 2016-09-03 NOTE — Consult Note (Signed)
Consultation Note Date: 09/03/2016   Patient Name: Tyler Clarke  DOB: [birth year]  MRN: [id number]  Age / Sex: 41 y.o., male  PCP: Leonard Downing, MD Referring Physician: Damita Lack, MD  Reason for Consultation: Establishing goals of care, Hospice Evaluation and Psychosocial/spiritual support  HPI/Patient Profile: 41 y.o. male  with past medical history of current smoker, heavy alcohol abuse, diet controlled DM, polycythemia, prepatellar bursitis of right knee admitted on 08/22/2016 from PCP office for abnormal labs, slurred speech, falls and jaundice. New diagnosis of advanced alcoholic liver cirrhosis and hepatitis. 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 Palliative care consulted to assist with [place] with continued decline.   Clinical Assessment and Goals of Care: I met today with Mr. Giller mother, Jeannene Patella. He also is legally married but his wife has been difficult to reach and just recently learned of his hospitalization. [name] tells me that wife, [name], has designated [name] to make decisions. I will confirm this with [name].   I had a long discussion with [name] who is very overwhelmed but understands that Carmichael is at EOL. We discussed hepatorenal and that I am very concerned with his worsening renal function. We had a frank conversation that with this worsening renal function he would be eligible for hospice facility with prognosis < 2 weeks. [name] was very tearful but says that after speaking with Dr. Reesa Chew this morning she felt prognosis was worse than she originally thought.   We clarified GOC for comfort as priority over all else. Proceed with paracentesis as tolerated and lactulose/meds. Do not add vasopressors. No desire to pursue dialysis/CRRT. We will meet again tomorrow morning to reassess  and talk more regarding hospice facility.   Primary Decision Maker NEXT OF KIN legally wife [name] is surrogate but mother Jeannene Patella has been delegated as Media planner (will verify with [name])    SUMMARY OF RECOMMENDATIONS   - DNR confirmed - NO vasopressors, NO dialysis/CRRT - Will discuss more hospice options  Code Status/Advance Care Planning:  DNR   Symptom Management:   Medications added for comfort as needed. PRN fentanyl for pain/dyspnea. PRN Robinul secretions. Continue PRN haldol for agitation.   Continue lactulose enemas.   Palliative Prophylaxis:   Aspiration, Bowel Regimen, Delirium Protocol, Frequent Pain Assessment, Oral Care and Turn Reposition  Additional Recommendations (Limitations, Scope, Preferences):  No Hemodialysis, No Surgical Procedures and No Tracheostomy  Psycho-social/Spiritual:   Desire for further Chaplaincy support:yes  Additional Recommendations: Caregiving  Support/Resources, Education on Hospice and Grief/Bereavement Support  Prognosis:   < 2 weeks is very likely  Discharge Planning: To Be Determined      Primary Diagnoses: Present on Admission: . Liver failure ([place])   I have reviewed the medical record, interviewed the patient and family, and examined the patient. The following aspects are pertinent.  Past Medical History:  Diagnosis Date  . Alcoholic hepatitis with ascites   . Alcoholism ([place])   .  Diet-controlled diabetes mellitus ([place])   . Polycythemia   . Prepatellar bursitis of right knee 06/2014   Social History   Social History  . Marital status: Married    Spouse name: N/A  . Number of children: N/A  . Years of education: N/A   Social History Main Topics  . Smoking status: Current Every Day Smoker    Packs/day: 0.50    Years: 10.00    Types: Cigarettes  . Smokeless tobacco: Never Used  . Alcohol use Yes     Comment: "few drinks/day"  . Drug use: No  . Sexual activity: Not Asked   Other Topics Concern    . None   Social History Narrative  . None   Family History  Problem Relation Age of Onset  . Adopted: Yes   Scheduled Meds: . feeding supplement (GLUCERNA SHAKE)  237 mL Oral TID BM  . folic acid  1 mg Oral Daily  . insulin aspart  0-15 Units Subcutaneous TID WC  . insulin glargine  18 Units Subcutaneous Daily  . lactulose  20 g Oral BID  . midodrine  5 mg Oral TID WC  . multivitamin with minerals  1 tablet Oral Daily  . octreotide  200 mcg Subcutaneous TID  . pantoprazole  40 mg Oral Q0600  . prednisoLONE  40 mg Oral Daily  . rifaximin  550 mg Oral BID  . thiamine  100 mg Oral Daily   Continuous Infusions: . albumin human    . cefTAZidime (FORTAZ)  IV Stopped (09/02/16 2355)  . metronidazole Stopped (09/03/16 0606)   PRN Meds:.haloperidol, ipratropium-albuterol, lactulose, LORazepam No Known Allergies Review of Systems  Unable to perform ROS: Acuity of condition    Physical Exam  Constitutional: He appears well-developed. He has a sickly appearance.  Jaundice   HENT:  Head: Normocephalic and atraumatic.  Cardiovascular: Normal rate and regular rhythm.   Pulmonary/Chest: Effort normal. No accessory muscle usage. No tachypnea. No respiratory distress.  Abdominal: Soft. He exhibits distension and ascites.  Neurological: He is disoriented and unresponsive.  Nursing note and vitals reviewed.   Vital Signs: BP 112/72 (BP Location: Right Arm)   Pulse 77   Temp 97.4 F (36.3 C) (Oral)   Resp (!) 27   Ht '6\' 4"'$  (1.93 m)   Wt 90.3 kg (199 lb)   SpO2 98%   BMI 24.22 kg/m  Pain Assessment: Faces POSS *See Group Information*: S-Acceptable,Sleep, easy to arouse Pain Score: 0-No pain   SpO2: SpO2: 98 % O2 Device:SpO2: 98 % O2 Flow Rate: .O2 Flow Rate (L/min): 2 L/min  IO: Intake/output summary:  Intake/Output Summary (Last 24 hours) at 09/03/16 1006 Last data filed at 09/03/16 0520  Gross per 24 hour  Intake              350 ml  Output              350 ml   Net                0 ml    LBM: Last BM Date: 09/02/16 Baseline Weight: Weight: 83.9 kg (185 lb) Most recent weight: Weight: 90.3 kg (199 lb)     Palliative Assessment/Data: 20%    Time Total: 83mn  Greater than 50%  of this time was spent counseling and coordinating care related to the above assessment and plan.  Signed by: AVinie Sill NP Palliative Medicine Team Pager # [phone](M-F 8a-5p) Team Phone # [phone](Nights/Weekends)

## 2016-09-03 NOTE — Progress Notes (Signed)
[facility] Progress Note  Chief Complaint:    Liver disease  Subjective: Transferred to ICU yesterday with respiratory distress, worsening renal failure  Objective:  Vital signs in last 24 hours: Temp:  [97.4 F (36.3 C)-98.1 F (36.7 C)] 97.4 F (36.3 C) (07/01 0855) Pulse Rate:  [75-85] 77 (07/01 0855) Resp:  [16-27] 27 (07/01 0855) BP: (87-112)/(56-72) 112/72 (07/01 0855) SpO2:  [86 %-100 %] 98 % (07/01 0855) Weight:  [199 lb (90.3 kg)] 199 lb (90.3 kg) (07/01 0855) Last BM Date: 09/02/16 General:   Jaundiced male lying in bed with eyes closed moving legs about but not responding to commands. Mother at bedside EENT:  Normal hearing, non icteric sclera, conjunctive pink.  Heart:  Regular rate and rhythm; no lower extremity edema Pulm: Normal respiratory effort, coughing, congested sounds in chest.  Abdomen:  Soft, moderately distended.  Normal bowel sounds Neurologic:  Lethargic, occasionally opens eyes. Doesn't follow commands   Intake/Output from previous day: 06/30 0701 - 07/01 0700 In: 350 [IV Piggyback:350] Out: 350 [Urine:350]  Lab Results:  Recent Labs  09/02/16 0337 09/03/16 0455  WBC 38.0* 31.4*  HGB 11.4* 10.6*  HCT 36.0* 33.0*  PLT 82* 92*   BMET  Recent Labs  09/01/16 1211 09/02/16 0337 09/03/16 0455  NA 148* 148* 150*  K 3.6 4.0 4.4  CL 123* 121* 122*  CO2 14* 15* 16*  GLUCOSE 204* 267* 125*  BUN 60* 66* 80*  CREATININE 2.68* 3.32* 4.20*  CALCIUM 8.5* 8.5* 8.6*   LFT  Recent Labs  09/03/16 0455  PROT 5.0*  ALBUMIN 3.1*  AST 133*  ALT 108*  ALKPHOS 317*  BILITOT 46.2*    US Abdomen Limited  Result Date: 09/02/2016 CLINICAL DATA:  Ascites. EXAM: LIMITED ABDOMEN ULTRASOUND FOR ASCITES TECHNIQUE: Limited ultrasound survey for ascites was performed in all four abdominal quadrants. COMPARISON:  08/22/2016 FINDINGS: Increasing ascites since the prior study with moderate to large volume ascites, most pronounced in  the right abdomen and left lower quadrant. IMPRESSION: Moderate to large volume ascites. Electronically Signed   By: Charlett Nose M.D.   On: 09/02/2016 16:14   Dg Chest Port 1 View  Result Date: 09/02/2016 CLINICAL DATA:  Dyspnea, diabetes mellitus, smoker EXAM: PORTABLE CHEST 1 VIEW COMPARISON:  Portable exam at 1351 hrs compared to 08/30/2016 FINDINGS: Normal heart size, mediastinal contours, and pulmonary vascularity. Lungs clear. No pleural effusion or pneumothorax. Old fracture posterolateral RIGHT seventh rib. Bones otherwise unremarkable. IMPRESSION: No acute abnormalities. Electronically Signed   By: Ulyses Southward M.D.   On: 09/02/2016 14:20    ASSESSMENT / PLAN:   41 yo male with ETOH hepatitis superimposed on decompensated cirrhosis with encephalopathy and ascites.  So far he hasn't improved with prednisolone. Patient critically ill with worsening liver and renal function.  -Hold Prednisolone until safely taking PO.  -Agree with lactulose enemas given altered mental status. Resume lactulose and Xifaxan when able to take PO -CCM recommending paracentesis with IV albumin to help renal perfusion and to take some pressure off  IVC.   -Palliative Care / Hospice evaluation has also been recommended.     LOS: 12 days   Willette Cluster ,NP 09/03/2016, 10:23 AM  Pager number [phone]    [facility] Attending   I have taken an interval history, reviewed the chart and examined the patient. I agree with the Advanced Practitioner's note, impression and recommendations.   He has multisystem organ failure - liver and kidney and  CNS tenuous at best. Unfortunately looks like he is losing the battle despite aggressive supportive care.  Hypernatremia is in part due to lactulose and may need to stop the lactulose.  I do not think he is septic.  Hospice care is likely best - will see what tomorrow brings.  Iva Booparl E. Gessner, MD, Antionette FairyFACG [facility] [phone] (pager) \09/03/2016 1:33  PM

## 2016-09-03 NOTE — Progress Notes (Signed)
Pharmacy Antibiotic Note  Tyler Clarke is a 41 y.o. male admitted on 08/22/2016 with acute alcoholic hepatitis with cirrhosis and ascites. Now with new concerns for sepsis and pharmacy was consulted to start Vancomycin + Ceftazidime + Flagyl for empiric coverage.  The patient is abx D#2 of the new regimen with continued worsening in SCr, now up to 4.2 << 3.32, CrCl is hard to estimate at this time. Will adjust Ceftazidime dose and move the Vancomycin random to 7/2 AM since the patient's loading dose was given at 1800 on 6/30.   Plan: 1. Adjust Ceftazidime to 2g IV every 24 hours 2. Will obtain a VR on 7/2 AM to ensure clearing prior to entering scheduled doses 3. Will continue to follow renal function, culture results, LOT, and antibiotic de-escalation plans   Height: 6\' 4"  (193 cm) Weight: 199 lb (90.3 kg) IBW/kg (Calculated) : 86.8  Temp (24hrs), Avg:97.5 F (36.4 C), Min:96.2 F (35.7 C), Max:98.1 F (36.7 C)   Recent Labs Lab 08/28/16 0224  08/30/16 0715 08/31/16 0344 09/01/16 1211 09/02/16 0337 09/03/16 0455  WBC 30.9*  --  28.1* 29.7*  --  38.0* 31.4*  CREATININE 2.05*  < > 2.07* 2.42* 2.68* 3.32* 4.20*  < > = values in this interval not displayed.  Estimated Creatinine Clearance: 29 mL/min (A) (by C-G formula based on SCr of 4.2 mg/dL (H)).    No Known Allergies  Antimicrobials this admission: Rocephin 6/19 >> 6/25 Vancomycin 6/30 >> Ceftazidime 6/30 >> Flagyl 6/30 >>  Microbiology results: 6/19 BCx >> NGf 6/30 BCx >>  Thank you for allowing pharmacy to be a part of this patient's care.  Georgina PillionElizabeth Kathan Kirker, PharmD, BCPS Clinical Pharmacist Clinical phone for 09/03/2016 from 7a-3:30p: [phone] If after 3:30p, please call main pharmacy at: [phone] 09/03/2016 12:08 PM

## 2016-09-04 LAB — CBC
HEMATOCRIT: 30.7 % — AB (ref 39.0–52.0)
HEMOGLOBIN: 9.8 g/dL — AB (ref 13.0–17.0)
MCH: 37.8 pg — ABNORMAL HIGH (ref 26.0–34.0)
MCHC: 31.9 g/dL (ref 30.0–36.0)
MCV: 118.5 fL — AB (ref 78.0–100.0)
Platelets: 79 10*3/uL — ABNORMAL LOW (ref 150–400)
RBC: 2.59 MIL/uL — ABNORMAL LOW (ref 4.22–5.81)
RDW: 14.3 % (ref 11.5–15.5)
WBC: 24.6 10*3/uL — AB (ref 4.0–10.5)

## 2016-09-04 LAB — COMPREHENSIVE METABOLIC PANEL
ALBUMIN: 3.7 g/dL (ref 3.5–5.0)
ALT: 86 U/L — AB (ref 17–63)
AST: 92 U/L — AB (ref 15–41)
Alkaline Phosphatase: 248 U/L — ABNORMAL HIGH (ref 38–126)
Anion gap: 13 (ref 5–15)
BUN: 85 mg/dL — AB (ref 6–20)
CHLORIDE: 125 mmol/L — AB (ref 101–111)
CO2: 15 mmol/L — ABNORMAL LOW (ref 22–32)
CREATININE: 4.51 mg/dL — AB (ref 0.61–1.24)
Calcium: 8.7 mg/dL — ABNORMAL LOW (ref 8.9–10.3)
GFR calc Af Amer: 17 mL/min — ABNORMAL LOW (ref >60)
GFR calc non Af Amer: 15 mL/min — ABNORMAL LOW (ref >60)
Glucose, Bld: 71 mg/dL (ref 65–99)
POTASSIUM: 3.3 mmol/L — AB (ref 3.5–5.1)
Sodium: 153 mmol/L — ABNORMAL HIGH (ref 135–145)
Total Bilirubin: 47 mg/dL (ref 0.3–1.2)
Total Protein: 5.3 g/dL — ABNORMAL LOW (ref 6.5–8.1)

## 2016-09-04 LAB — GLUCOSE, CAPILLARY
GLUCOSE-CAPILLARY: 44 mg/dL — AB (ref 65–99)
Glucose-Capillary: 132 mg/dL — ABNORMAL HIGH (ref 65–99)
Glucose-Capillary: 96 mg/dL (ref 65–99)
Glucose-Capillary: 108 mg/dL — ABNORMAL HIGH (ref 65–99)
Glucose-Capillary: 127 mg/dL — ABNORMAL HIGH (ref 65–99)

## 2016-09-04 LAB — MAGNESIUM: MAGNESIUM: 3 mg/dL — AB (ref 1.7–2.4)

## 2016-09-04 LAB — VANCOMYCIN, RANDOM: Vancomycin Rm: 18

## 2016-09-04 MED ORDER — POTASSIUM CHLORIDE 10 MEQ/100ML IV SOLN
10.0000 meq | INTRAVENOUS | Status: AC
  Administered 2016-09-04 (×3): 10 meq via INTRAVENOUS
  Filled 2016-09-04 (×3): qty 100

## 2016-09-04 MED ORDER — DEXTROSE 50 % IV SOLN
INTRAVENOUS | Status: AC
  Administered 2016-09-04: 50 mL
  Filled 2016-09-04: qty 50

## 2016-09-04 MED ORDER — VANCOMYCIN HCL 10 G IV SOLR
1500.0000 mg | Freq: Once | INTRAVENOUS | Status: AC
  Administered 2016-09-04: 1500 mg via INTRAVENOUS
  Filled 2016-09-04: qty 1500

## 2016-09-04 MED ORDER — POTASSIUM CHLORIDE 10 MEQ/100ML IV SOLN
10.0000 meq | Freq: Once | INTRAVENOUS | Status: AC
  Administered 2016-09-04: 10 meq via INTRAVENOUS
  Filled 2016-09-04: qty 100

## 2016-09-04 NOTE — Progress Notes (Signed)
[facility] Progress Note    Since last GI note: Appreciate and agree with palliative care input, plan for comfort care.  Objective: Vital signs in last 24 hours: Temp:  [96.2 F (35.7 C)-97.5 F (36.4 C)] 97.5 F (36.4 C) (07/02 0834) Pulse Rate:  [77-88] 88 (07/02 0834) Resp:  [15-28] 19 (07/02 0834) BP: (95-112)/(54-83) 105/69 (07/02 0834) SpO2:  [98 %-100 %] 99 % (07/02 0834) Last BM Date: 09/03/16 General: alert and oriented times 1 or 2 Heart: regular rate and rythm Abdomen: moderately tense ascites Deeply jaundiced No lower ext edema   Lab Results:  Recent Labs  09/02/16 0337 09/03/16 0455 09/04/16 0243  WBC 38.0* 31.4* 24.6*  HGB 11.4* 10.6* 9.8*  PLT 82* 92* 79*  MCV 119.2* 117.4* 118.5*    Recent Labs  09/02/16 0337 09/03/16 0455 09/04/16 0243  NA 148* 150* 153*  K 4.0 4.4 3.3*  CL 121* 122* 125*  CO2 15* 16* 15*  GLUCOSE 267* 125* 71  BUN 66* 80* 85*  CREATININE 3.32* 4.20* 4.51*  CALCIUM 8.5* 8.6* 8.7*    Recent Labs  09/02/16 0337 09/03/16 0455 09/04/16 0243  PROT 4.9* 5.0* 5.3*  ALBUMIN 2.2* 3.1* 3.7  AST 137* 133* 92*  ALT 115* 108* 86*  ALKPHOS 388* 317* 248*  BILITOT 43.2* 46.2* 47.0*   Studies/Results: [place] Abdomen Limited  Result Date: 09/02/2016 CLINICAL DATA:  Ascites. EXAM: LIMITED ABDOMEN ULTRASOUND FOR ASCITES TECHNIQUE: Limited ultrasound survey for ascites was performed in all four abdominal quadrants. COMPARISON:  08/22/2016 FINDINGS: Increasing ascites since the prior study with moderate to large volume ascites, most pronounced in the right abdomen and left lower quadrant. IMPRESSION: Moderate to large volume ascites. Electronically Signed   By: Charlett NoseKevin  Dover M.D.   On: 09/02/2016 16:14   Dg Chest Port 1 View  Result Date: 09/02/2016 CLINICAL DATA:  Dyspnea, diabetes mellitus, smoker EXAM: PORTABLE CHEST 1 VIEW COMPARISON:  Portable exam at 1351 hrs compared to 08/30/2016 FINDINGS: Normal heart size,  mediastinal contours, and pulmonary vascularity. Lungs clear. No pleural effusion or pneumothorax. Old fracture posterolateral RIGHT seventh rib. Bones otherwise unremarkable. IMPRESSION: No acute abnormalities. Electronically Signed   By: Ulyses SouthwardMark  Boles M.D.   On: 09/02/2016 14:20     Medications: Scheduled Meds: . feeding supplement (GLUCERNA SHAKE)  237 mL Oral TID BM  . folic acid  1 mg Oral Daily  . insulin aspart  0-15 Units Subcutaneous TID WC  . insulin glargine  18 Units Subcutaneous Daily  . lactulose  20 g Oral BID  . multivitamin with minerals  1 tablet Oral Daily  . pantoprazole  40 mg Oral Q0600  . prednisoLONE  40 mg Oral Daily  . rifaximin  550 mg Oral BID  . thiamine  100 mg Oral Daily   Continuous Infusions: . albumin human    . cefTAZidime (FORTAZ)  IV Stopped (09/03/16 1749)  . metronidazole Stopped (09/04/16 16100632)  . potassium chloride    . vancomycin     PRN Meds:.fentaNYL (SUBLIMAZE) injection, glycopyrrolate, haloperidol, ipratropium-albuterol, LORazepam    Assessment/Plan: 41 y.o. male with severe alcoholic hepatitis with underlying Etoh related cirrhosis.  He is encephalopathic, deeply jaundice and Cr continues to rise despite aggressive supportive measures. I do not think he will survive this and agree with palliative care, comfort measures.  Please call or page with any further questions or concerns.   Rachael FeeJacobs, Aleksa Collinsworth P, MD  09/04/2016, 10:38 AM [facility] Pager [phone]

## 2016-09-04 NOTE — Progress Notes (Signed)
Physical Therapy Treatment Patient Details Name: Tyler Clarke MRN: [id number] DOB: May 08, 1975 Today's Date: 09/04/2016    History of Present Illness Pt is a 41 yo male admitted through ED on 08/22/16 with recent falls, jaundice, ETOH hepatitis, AKI, ascites, encephalopathy and liver faillure. PMH signifcant for heavy alcohol abuse and DM.     PT Comments    Pt very slow to follow commands, needs verbal and tactile cues. Pt transferred bed to chair with 180 degree pivot with RW with mod A +2. PT will continue to follow.    Follow Up Recommendations  SNF;Supervision/Assistance - 24 hour     Equipment Recommendations  Rolling walker with 5" wheels;3in1 (PT)    Recommendations for Other Services       Precautions / Restrictions Precautions Precautions: Fall Restrictions Weight Bearing Restrictions: No    Mobility  Bed Mobility Overal bed mobility: Needs Assistance Bed Mobility: Supine to Sit     Supine to sit: Mod assist;HOB elevated     General bed mobility comments: Mod A to bring LE's off bed and to bring trunk upright at EOB. Pt appears to have diffiuclty with sequencing mobility.   Transfers Overall transfer level: Needs assistance Equipment used: Rolling walker (2 wheeled) Transfers: Sit to/from [employer] Sit to Stand: Mod assist;+2 safety/equipment Stand pivot transfers: Mod assist;+2 physical assistance       General transfer comment: mod A to intiate standing. When attempted to give pt a boost, he just pulled back. When stood in front of him and gave input to chest, he began to rise with mod A from behind for support. Needed mgmt of RW from front with 180 degree pivot and mod A for support from back. Pt very shaky and needed verbal and tactile cues for stepping and backing to chair.   Ambulation/Gait             General Gait Details: pt to fatigued with short distance of pivoting   Stairs            Wheelchair Mobility     Modified Rankin (Stroke Patients Only)       Balance Overall balance assessment: Needs assistance;History of Falls Sitting-balance support: No upper extremity supported;Feet supported Sitting balance-Leahy Scale: Fair Sitting balance - Comments: pt can maintain sitting EOB with foot unsupported and supported   Standing balance support: Bilateral upper extremity supported;During functional activity Standing balance-Leahy Scale: Poor Standing balance comment: reliant on UE support                            Cognition Arousal/Alertness: Awake/alert Behavior During Therapy: WFL for tasks assessed/performed Overall Cognitive Status: Impaired/Different from baseline Area of Impairment: Orientation;Attention;Following commands;Safety/judgement;Awareness;Problem solving;Memory                 Orientation Level: Disoriented to;Person;Place;Time;Situation Current Attention Level: Focused Memory: Decreased short-term memory Following Commands: Follows one step commands inconsistently;Follows one step commands with increased time Safety/Judgement: Decreased awareness of safety;Decreased awareness of deficits Awareness: Intellectual Problem Solving: Slow processing;Decreased initiation;Requires verbal cues;Requires tactile cues General Comments: pt can tell me his name but and birth month and day but not year. Verbalizes that he is going to follow commands but does not then physically follow them or when he does it is with large delay.       Exercises      General Comments General comments (skin integrity, edema, etc.): O2 sats and HR stable  Pertinent Vitals/Pain Pain Assessment: Faces Faces Pain Scale: Hurts little more Pain Location: R foot Pain Descriptors / Indicators: Aching Pain Intervention(s): Limited activity within patient's tolerance;Monitored during session    Home Living                      Prior Function            PT Goals  (current goals can now be found in the care plan section) Acute Rehab PT Goals Patient Stated Goal: none stated PT Goal Formulation: Patient unable to participate in goal setting Time For Goal Achievement: 09/22/16 Potential to Achieve Goals: Fair Progress towards PT goals: Progressing toward goals    Frequency    Min 3X/week      PT Plan Current plan remains appropriate    Co-evaluation              AM-PAC PT "6 Clicks" Daily Activity  Outcome Measure  Difficulty turning over in bed (including adjusting bedclothes, sheets and blankets)?: Total Difficulty moving from lying on back to sitting on the side of the bed? : Total Difficulty sitting down on and standing up from a chair with arms (e.g., wheelchair, bedside commode, etc,.)?: Total Help needed moving to and from a bed to chair (including a wheelchair)?: A Lot Help needed walking in hospital room?: A Lot Help needed climbing 3-5 steps with a railing? : Total 6 Click Score: 8    End of Session Equipment Utilized During Treatment: Gait belt Activity Tolerance: Patient limited by fatigue Patient left: in chair;with chair alarm set;with restraints reapplied;with call bell/phone within reach;with nursing/sitter in room (mittens) Nurse Communication: Mobility status PT Visit Diagnosis: Unsteadiness on feet (R26.81);Muscle weakness (generalized) (M62.81);Repeated falls (R29.6);Difficulty in walking, not elsewhere classified (R26.2)     Time: [id number] PT Time Calculation (min) (ACUTE ONLY): 28 min  Charges:  $Gait Training: 8-22 mins $Therapeutic Activity: 8-22 mins                    G Codes:       Lyanne CoVictoria Kayton Dunaj, PT  Acute Rehab Services  [phone]    Lawana ChambersVictoria L Cale Decarolis 09/04/2016, 6:06 PM

## 2016-09-04 NOTE — Progress Notes (Signed)
Pharmacy Antibiotic Note  Tyler Clarke is a 41 y.o. male admitted on 08/22/2016 with acute alcoholic hepatitis with cirrhosis and ascites. Now with new concerns for sepsis and pharmacy was consulted to start Vancomycin + Ceftazidime + Flagyl for empiric coverage.  Patient with hepato renal syndrome with worsening renal fx  vanc random 36 hrs post 1500 mg x 1 - 18   Plan: - Vanc 1500 mg x 1 - recheck lvl in 36-48 hrs - Adjust Ceftaz to 2g/24h - Flagyl 500 q8h  Height: 6\' 4"  (193 cm) Weight: 199 lb (90.3 kg) IBW/kg (Calculated) : 86.8  Temp (24hrs), Avg:97 F (36.1 C), Min:96.2 F (35.7 C), Max:97.5 F (36.4 C)   Recent Labs Lab 08/30/16 0715 08/31/16 0344 09/01/16 1211 09/02/16 0337 09/03/16 0455 09/04/16 0243  WBC 28.1* 29.7*  --  38.0* 31.4* 24.6*  CREATININE 2.07* 2.42* 2.68* 3.32* 4.20* 4.51*  VANCORANDOM  --   --   --   --   --  18    Estimated Creatinine Clearance: 27 mL/min (A) (by C-G formula based on SCr of 4.51 mg/dL (H)).    No Known Allergies  Tyler Clarke, PharmD, BCPS, BCCCP Clinical Pharmacist Clinical phone for 09/04/2016 from 7a-3:30p: [phone] If after 3:30p, please call main pharmacy at: [phone] 09/04/2016 8:53 AM

## 2016-09-04 NOTE — Progress Notes (Signed)
OT Cancellation Note  Patient Details Name: [name] E Scheirer MRN: [id number] DOB: [birth year]   Cancelled Treatment:    Reason Eval/Treat Not Completed: Medical issues which prohibited therapy.  Family also in discussion with palliative about transition to full comfort care.  Will follow to determine appropriateness.  Gerldine Suleiman Painteronarpe, OTR/L [phone]   Jeani HawkingConarpe, Ksenia Kunz M 09/04/2016, 12:34 PM

## 2016-09-04 NOTE — Progress Notes (Signed)
PROGRESS NOTE    Tyler Clarke  ZOX:[id number] DOB: 1975-10-18 DOA: 08/22/2016 PCP: Kaleen Mask, MD   Brief Narrative:  41 year old male with history of heavy alcohol use, diabetes type 2 came to the ER after having abnormal labs outpatient. Patient was found to be in decompensated liver cirrhosis, new diagnosis secondary to alcohol abuse. He has been getting treatment for acute alcoholic hepatitis and renal failure. After a few days of treatment with lactulose, octreotide and albumin patient's mental status was improving but his alcohol hepatitis still continued to worsen. On 09/03/2016 due to failure of improvement in his mental status and worsening renal function, hepatic function and low blood pressure he was transferred to stepdown unit.   Assessment & Plan:   Active Problems:   Liver failure (HCC)   Alcoholic cirrhosis of liver without ascites (HCC)   Pressure injury of skin   Encephalopathy   Alcoholic hepatitis without ascites   Encephalopathy, hepatic (HCC)   Goals of care, counseling/discussion   Palliative care encounter   Acute kidney failure (HCC)  Decompensated liver cirrhosis likely secondary to alcohol use Moderate alcoholic hepatitis; worsening  -Gastroenterology recs noted. Continue prednisone (day 14 ). Plan to complete 28 day course steroids. Not sure how much his steroids are helping at this point but will continue to complete the course. -Acute hepatitis panel negative. Abdominal ultrasound showed hepatic cirrhosis. Repeat Limited US now shows mod to large ascites but unable to get paracentesis today due to his condition -Continue lactulose and rifaximin. Rectal lactulose  -Status post 1 week of Rocephin treatment. But due to worsening mental status and leukocytosis we will restart broad-spectrum antibiotics vancomycin D3; Ceftazidime D2 and flagyl D3 per pharm recs) -AFP checked on 08/22/2016-normal -Supportive care -Given recent history of alcohol  drinking he's not a transplant candidate at this time.  Hepatic encephalopathy, intermittent but confused this morning -Underlying infections and/or worsening encephalopathy  -Continue lactulose and IV antibiotics for presumed infection. It can be stopped once family has determined that patient is comfort care and does not want any further treatment. Appreciate palliative care input -Keep him nothing by mouth while mental status improves  Acute kidney injury Hepatorenal syndrome type I -Currently is on IV albumin and octreotide. Will also administer Midodrine 3 times daily once his mental status improves. Creatinine continues to worsen. Closely monitor urine output, Foley in place -Palliative care following. Appreciate their  Hypernatremia -Elevated sodium at 153. Encourage oral liquid. Ideally would stop lactulose but at this time he needs it given his mental status  Thrombocytopenia  -likely secondary to liver disease  Hypoalbuminemia -Albumin supplements added due to HRS type I  Diabetes type 2 -Currently on insulin regimen. Continue at this time  Alcohol abuse -CIWA protocol  His liver and renal function continues to worsen despite of maximizing medical therapy at this time. Palliative care is following, will recommend patient to be comfort care. Family/mother to make decision. Patient has poor prognosis.  DVT prophylaxis: SCDs Code Status: DNR. No vasopressors, no dialysis/CRRT discussed Family Communication:  Will speak with mother over the phone  Disposition Plan: To be determined  Consultants:   Gastroenterology  Nephrology  Palliative care   Critical Care  Procedures:   None  Subjective: Patient is calm this morning and is pleasantly confused. Does not have any complaints. He is only able to tell me his name otherwise unable to tolerate Place day and the reason why he is here.  Objective: Vitals:   09/04/16 0400  09/04/16 0500 09/04/16 0600 09/04/16 0834    BP: 111/74 112/66 (!) 97/54 105/69  Pulse: 85 82 80 88  Resp: (!) 26 (!) 24 (!) 22 19  Temp:  97.5 F (36.4 C)  97.5 F (36.4 C)  TempSrc:  Oral  Oral  SpO2: 99% 99% 100% 99%  Weight:      Height:        Intake/Output Summary (Last 24 hours) at 09/04/16 1114 Last data filed at 09/04/16 0500  Gross per 24 hour  Intake              450 ml  Output              675 ml  Net             -225 ml   Filed Weights   08/31/16 2046 09/01/16 2138 09/03/16 0855  Weight: 90.3 kg (199 lb 1.2 oz) 93.4 kg (206 lb) 90.3 kg (199 lb)    Examination:  General exam: Drowsy and confused AAOX0; Jaundice and scleral icterus Respiratory system: Clear to auscultation. Respiratory effort normal. Cardiovascular system: S1 & S2 heard, RRR. No JVD, murmurs, rubs, gallops or clicks. No pedal edema. Gastrointestinal system: Abdomen is distended but soft and nontender. No organomegaly or masses felt. Normal bowel sounds heard. Central nervous system: Alert and oriented. No focal neurological deficits. Extremities: Symmetric 4+ x 5 power. Skin: No rashes, lesions or ulcers Psychiatry: Judgement and insight poor Mittens in place as he poses danger to himself - pulling on lines and tube   Data Reviewed:   CBC:  Recent Labs Lab 08/30/16 0715 08/31/16 0344 09/02/16 0337 09/03/16 0455 09/04/16 0243  WBC 28.1* 29.7* 38.0* 31.4* 24.6*  HGB 11.7* 10.7* 11.4* 10.6* 9.8*  HCT 36.3* 33.4* 36.0* 33.0* 30.7*  MCV 119.0* 116.8* 119.2* 117.4* 118.5*  PLT 73* 70* 82* 92* 79*   Basic Metabolic Panel:  Recent Labs Lab 08/31/16 0344 09/01/16 1211 09/02/16 0337 09/03/16 0455 09/04/16 0243  NA 146* 148* 148* 150* 153*  K 3.4* 3.6 4.0 4.4 3.3*  CL 121* 123* 121* 122* 125*  CO2 16* 14* 15* 16* 15*  GLUCOSE 124* 204* 267* 125* 71  BUN 57* 60* 66* 80* 85*  CREATININE 2.42* 2.68* 3.32* 4.20* 4.51*  CALCIUM 7.9* 8.5* 8.5* 8.6* 8.7*  MG  --   --  2.6* 2.8* 3.0*   GFR: Estimated Creatinine Clearance: 27  mL/min (A) (by C-G formula based on SCr of 4.51 mg/dL (H)). Liver Function Tests:  Recent Labs Lab 08/31/16 0344 09/01/16 1211 09/02/16 0337 09/03/16 0455 09/04/16 0243  AST 141* 133* 137* 133* 92*  ALT 95* 111* 115* 108* 86*  ALKPHOS 278* 352* 388* 317* 248*  BILITOT 43.6* 44.3* 43.2* 46.2* 47.0*  PROT 4.2* 5.0* 4.9* 5.0* 5.3*  ALBUMIN 2.1* 2.4* 2.2* 3.1* 3.7   No results for input(s): LIPASE, AMYLASE in the last 168 hours.  Recent Labs Lab 08/29/16 0324 08/30/16 0313 09/02/16 1450  AMMONIA 37* 22 124*   Coagulation Profile: No results for input(s): INR, PROTIME in the last 168 hours. Cardiac Enzymes: No results for input(s): CKTOTAL, CKMB, CKMBINDEX, TROPONINI in the last 168 hours. BNP (last 3 results) No results for input(s): PROBNP in the last 8760 hours. HbA1C: No results for input(s): HGBA1C in the last 72 hours. CBG:  Recent Labs Lab 09/03/16 1329 09/03/16 1755 09/03/16 2048 09/04/16 0901 09/04/16 0938  GLUCAP 101* 96 85 44* 132*   Lipid Profile: No results for input(s):  CHOL, HDL, LDLCALC, TRIG, CHOLHDL, LDLDIRECT in the last 72 hours. Thyroid Function Tests: No results for input(s): TSH, T4TOTAL, FREET4, T3FREE, THYROIDAB in the last 72 hours. Anemia Panel: No results for input(s): VITAMINB12, FOLATE, FERRITIN, TIBC, IRON, RETICCTPCT in the last 72 hours. Sepsis Labs: No results for input(s): PROCALCITON, LATICACIDVEN in the last 168 hours.  Recent Results (from the past 240 hour(s))  Culture, blood (routine x 2)     Status: None (Preliminary result)   Collection Time: 09/02/16  2:50 PM  Result Value Ref Range Status   Specimen Description BLOOD LEFT ANTECUBITAL  Final   Special Requests   Final    BOTTLES DRAWN AEROBIC ONLY Blood Culture results may not be optimal due to an inadequate volume of blood received in culture bottles   Culture NO GROWTH 1 DAY  Final   Report Status PENDING  Incomplete  Culture, blood (routine x 2)     Status: None  (Preliminary result)   Collection Time: 09/02/16  2:50 PM  Result Value Ref Range Status   Specimen Description BLOOD BLOOD LEFT HAND  Final   Special Requests   Final    BOTTLES DRAWN AEROBIC ONLY Blood Culture results may not be optimal due to an inadequate volume of blood received in culture bottles   Culture NO GROWTH 1 DAY  Final   Report Status PENDING  Incomplete         Radiology Studies: US Abdomen Limited  Result Date: 09/02/2016 CLINICAL DATA:  Ascites. EXAM: LIMITED ABDOMEN ULTRASOUND FOR ASCITES TECHNIQUE: Limited ultrasound survey for ascites was performed in all four abdominal quadrants. COMPARISON:  08/22/2016 FINDINGS: Increasing ascites since the prior study with moderate to large volume ascites, most pronounced in the right abdomen and left lower quadrant. IMPRESSION: Moderate to large volume ascites. Electronically Signed   By: Charlett Nose M.D.   On: 09/02/2016 16:14   Dg Chest Port 1 View  Result Date: 09/02/2016 CLINICAL DATA:  Dyspnea, diabetes mellitus, smoker EXAM: PORTABLE CHEST 1 VIEW COMPARISON:  Portable exam at 1351 hrs compared to 08/30/2016 FINDINGS: Normal heart size, mediastinal contours, and pulmonary vascularity. Lungs clear. No pleural effusion or pneumothorax. Old fracture posterolateral RIGHT seventh rib. Bones otherwise unremarkable. IMPRESSION: No acute abnormalities. Electronically Signed   By: Ulyses Southward M.D.   On: 09/02/2016 14:20        Scheduled Meds: . feeding supplement (GLUCERNA SHAKE)  237 mL Oral TID BM  . folic acid  1 mg Oral Daily  . insulin aspart  0-15 Units Subcutaneous TID WC  . lactulose  20 g Oral BID  . multivitamin with minerals  1 tablet Oral Daily  . pantoprazole  40 mg Oral Q0600  . prednisoLONE  40 mg Oral Daily  . rifaximin  550 mg Oral BID  . thiamine  100 mg Oral Daily   Continuous Infusions: . albumin human    . cefTAZidime (FORTAZ)  IV Stopped (09/03/16 1749)  . metronidazole Stopped (09/04/16 1610)  .  potassium chloride 10 mEq (09/04/16 1108)  . vancomycin 1,500 mg (09/04/16 1108)     LOS: 13 days    Time spent: 35 mins     Ankit Joline Maxcy, MD [facility] Pager [phone]   If 7PM-7AM, please contact night-coverage [email or web address] Password [email or web address] 09/04/2016, 11:14 AM

## 2016-09-04 NOTE — Progress Notes (Signed)
   09/04/16 1530  Clinical Encounter Type  Visited With Patient  Visit Type Other (Comment) ([place]ly consult)  Spiritual Encounters  Spiritual Needs Emotional  Stress Factors  Patient Stress Factors Health changes  Introduction to Pt. [facility]. Pt unaware of request. Discussed with him of talking again with his mother on any specific needs and to page for chaplain if requested.

## 2016-09-05 ENCOUNTER — Inpatient Hospital Stay (HOSPITAL_COMMUNITY): Payer: Medicaid Other

## 2016-09-05 ENCOUNTER — Encounter (HOSPITAL_COMMUNITY): Payer: Self-pay | Admitting: Student

## 2016-09-05 DIAGNOSIS — Z7189 Other specified counseling: Secondary | ICD-10-CM

## 2016-09-05 HISTORY — PX: IR PARACENTESIS: IMG2679

## 2016-09-05 LAB — COMPREHENSIVE METABOLIC PANEL
ALBUMIN: 3.6 g/dL (ref 3.5–5.0)
ALK PHOS: 180 U/L — AB (ref 38–126)
ALT: 58 U/L (ref 17–63)
ANION GAP: 15 (ref 5–15)
AST: 54 U/L — AB (ref 15–41)
BILIRUBIN TOTAL: 46.1 mg/dL — AB (ref 0.3–1.2)
BUN: 92 mg/dL — AB (ref 6–20)
CALCIUM: 8.7 mg/dL — AB (ref 8.9–10.3)
CO2: 12 mmol/L — ABNORMAL LOW (ref 22–32)
Chloride: 128 mmol/L — ABNORMAL HIGH (ref 101–111)
Creatinine, Ser: 4.82 mg/dL — ABNORMAL HIGH (ref 0.61–1.24)
GFR calc Af Amer: 16 mL/min — ABNORMAL LOW (ref >60)
GFR calc non Af Amer: 14 mL/min — ABNORMAL LOW (ref >60)
GLUCOSE: 140 mg/dL — AB (ref 65–99)
Potassium: 4 mmol/L (ref 3.5–5.1)
Sodium: 155 mmol/L — ABNORMAL HIGH (ref 135–145)
TOTAL PROTEIN: 5 g/dL — AB (ref 6.5–8.1)

## 2016-09-05 LAB — BODY FLUID CELL COUNT WITH DIFFERENTIAL
LYMPHS FL: 2 %
MONOCYTE-MACROPHAGE-SEROUS FLUID: 9 % — AB (ref 50–90)
NEUTROPHIL FLUID: 89 % — AB (ref 0–25)
WBC FLUID: 5167 uL — AB (ref 0–1000)

## 2016-09-05 LAB — LACTATE DEHYDROGENASE, PLEURAL OR PERITONEAL FLUID: LD, Fluid: 422 U/L — ABNORMAL HIGH (ref 3–23)

## 2016-09-05 LAB — GRAM STAIN

## 2016-09-05 LAB — GLUCOSE, CAPILLARY: Glucose-Capillary: 115 mg/dL — ABNORMAL HIGH (ref 65–99)

## 2016-09-05 LAB — PROTEIN, PLEURAL OR PERITONEAL FLUID

## 2016-09-05 LAB — MAGNESIUM: Magnesium: 3 mg/dL — ABNORMAL HIGH (ref 1.7–2.4)

## 2016-09-05 LAB — GLUCOSE, PLEURAL OR PERITONEAL FLUID: Glucose, Fluid: 139 mg/dL

## 2016-09-05 MED ORDER — LIDOCAINE HCL (PF) 1 % IJ SOLN
INTRAMUSCULAR | Status: AC
  Filled 2016-09-05: qty 30

## 2016-09-05 MED ORDER — HALOPERIDOL LACTATE 2 MG/ML PO CONC
2.0000 mg | Freq: Four times a day (QID) | ORAL | Status: DC | PRN
  Filled 2016-09-05: qty 1

## 2016-09-05 MED ORDER — LORAZEPAM 2 MG/ML IJ SOLN: 1.0000 mg | INTRAMUSCULAR | Status: DC | PRN

## 2016-09-05 MED ORDER — PANTOPRAZOLE SODIUM 40 MG IV SOLR
40.0000 mg | INTRAVENOUS | Status: DC
  Administered 2016-09-05 – 2016-09-06 (×2): 40 mg via INTRAVENOUS
  Filled 2016-09-05 (×2): qty 40

## 2016-09-05 NOTE — Progress Notes (Signed)
Patient able to tell RN his name only.   Speaking a lot about his daughter then in language not understandable. Patient not agreeable to taking medications by mouth or having his temperature taking by mouth...deliberately holding his mouth closed.

## 2016-09-05 NOTE — Plan of Care (Signed)
Problem: Bowel/Gastric: Goal: Will not experience complications related to bowel motility Outcome: Adequate for Discharge Patient now on Palliative care with main focus on comfort.  Paracentesis performed today 09/06/14 with 2L pulled off. Variance: Change in diagnosis Comments: Patient now Palliative care

## 2016-09-05 NOTE — Progress Notes (Signed)
Call received from wife stating she had received call from Hospice RN to call her and she did with no response.  Wife asked for update.  Confirmed with her that she was talking with mother in law even though she was at work and was sure she had missed mother in laws called.  Instructed wife patient would be going to [facility] tomorrow and we are doing more comfort care.  Wife states she will be here in 1.5 hours.

## 2016-09-05 NOTE — Procedures (Signed)
PROCEDURE SUMMARY:  Successful US guided paracentesis from right lateral abdomen.  Yielded 2.0 liters of bright yellow fluid.  No immediate complications.  Pt tolerated well.   Specimen was sent for labs.  Hoyt KochKacie Sue-Ellen Matthews PA-C 09/05/2016 12:12 PM

## 2016-09-05 NOTE — Consult Note (Signed)
[facility] Liaison  Received request from CSW Fuqu[place]-VarinaJenna for family interest in [facility] room 12-25-16. Chart reviewed and spoke with patient's mother by phone per CSW instruction. Both aware [facility] this afternoon and I will follow up tomorrow re availability then.  Thank you,  Forrestine Himva Davis, LCSW [phone]

## 2016-09-05 NOTE — Progress Notes (Signed)
PROGRESS NOTE    Tyler Clarke  ZOX:[id number] DOB: [birth year] DOA: 08/22/2016 PCP: Kaleen Mask, MD   Brief Narrative:  41 year old male with history of heavy alcohol use, diabetes type 2 came to the ER after having abnormal labs outpatient. Patient was found to be in decompensated liver cirrhosis, new diagnosis secondary to alcohol abuse. He has been getting treatment for acute alcoholic hepatitis and renal failure. After a few days of treatment with lactulose, octreotide and albumin patient's mental status was improving but his alcohol hepatitis still continued to worsen. On 09/03/2016 due to failure of improvement in his mental status and worsening renal function, hepatic function and low blood pressure he was transferred to stepdown unit. Seen by palliative care as we think he may not improve much, family is still to determine regarding his Comfort Care status.   Assessment & Plan:   Active Problems:   Liver failure (HCC)   Alcoholic cirrhosis of liver without ascites (HCC)   Pressure injury of skin   Encephalopathy   Alcoholic hepatitis without ascites   Encephalopathy, hepatic (HCC)   Goals of care, counseling/discussion   Palliative care encounter   Acute kidney failure (HCC)  Decompensated liver cirrhosis likely secondary to alcohol use Moderate alcoholic hepatitis; worsening  -Gastroenterology recs noted. Continue prednisone (day 15 ). Plan to complete 28 day course steroids. Not improving much on steroids, will cont for now.  -Acute hepatitis panel negative. Abdominal ultrasound showed hepatic cirrhosis. Repeat Limited US now shows mod to large ascites. Hold off on any paracentesis until family determines his long term goals of care.   -Continue lactulose and rifaximin. Rectal lactulose prn -Status post 1 week of Rocephin treatment. But due to worsening mental status and leukocytosis we will restart broad-spectrum antibiotics vancomycin D4; Ceftazidime D3 and flagyl  D4 per pharm recs) -AFP checked on 08/22/2016-normal -Supportive care -Given recent history of alcohol drinking he's not a transplant candidate at this time.  Hepatic encephalopathy, confused this morning -Underlying infections and/or worsening encephalopathy  -Continue lactulose (PO and rectal depending on his mental status) and IV antibiotics for presumed infection. It can be stopped once family has determined that patient is comfort care and does not want any further treatment. Appreciate palliative care input -Keep him nothing by mouth while mental status improves  Acute kidney injury Hepatorenal syndrome type I;  Continues to worsen.  -Currently is on IV albumin and octreotide. Will also administer Midodrine 3 times daily once his mental status improves, unable to take po this morning. Creatinine continues to worsen. Closely monitor urine output, Foley in place -Palliative care following. Appreciate their input.   Hypernatremia -Elevated sodium at 155. Encourage oral liquid. Ideally would stop lactulose but at this time he needs it given his mental status  Thrombocytopenia  -likely secondary to liver disease  Hypoalbuminemia -Albumin supplements added due to HRS type I  Diabetes type 2 -Currently on insulin regimen. Continue at this time  Alcohol abuse -CIWA protocol  Worsening renal and hepatic condition despite maximizing therapy. Family doesn't want vasopressors, Dialysis. Family to determine comfort care status.  Patient has poor prognosis.  DVT prophylaxis: SCDs Code Status: DNR. No vasopressors, no dialysis/CRRT discussed Family Communication:  Spoke with the patients mother, Elita Quick, over the phone.  Disposition Plan: To be determined  Consultants:   Gastroenterology  Nephrology  Palliative care   Critical Care  Procedures:   None  Subjective: Patient is calm and confused this morning. No complaints. He did have a  BM overnight and one this morning. Remains  afebrile.   Spoke with the patients mother over the phone, she is getting ready to speak with palliative care staff so I will speak with her again after.   Objective: Vitals:   09/04/16 1500 09/04/16 2000 09/05/16 0300 09/05/16 0820  BP: (!) 99/59 101/65 108/65 98/66  Pulse: 86 85 80   Resp: (!) 30 (!) 28 20 17   Temp: 97.8 F (36.6 C) 97.9 F (36.6 C) 97.6 F (36.4 C) 98 F (36.7 C)  TempSrc: Oral Oral Axillary Oral  SpO2: 100% 99% 98% 100%  Weight:   91.2 kg (201 lb)   Height:        Intake/Output Summary (Last 24 hours) at 09/05/16 0950 Last data filed at 09/05/16 [id number]  Gross per 24 hour  Intake                0 ml  Output              850 ml  Net             -850 ml   Filed Weights   09/01/16 2138 09/03/16 0855 09/05/16 0300  Weight: 93.4 kg (206 lb) 90.3 kg (199 lb) 91.2 kg (201 lb)    Examination:  General exam: Drowsy and confused AAOX1; Jaundice and scleral icterus Respiratory system: Clear to auscultation. Respiratory effort normal. Cardiovascular system: S1 & S2 heard, RRR. No JVD, murmurs, rubs, gallops or clicks. No pedal edema. Gastrointestinal system: Abdomen is distended but soft and nontender. No organomegaly or masses felt. Normal bowel sounds heard. Central nervous system: Alert and oriented. No focal neurological deficits. Extremities: Symmetric 4+ x 5 power. Skin: No rashes, lesions or ulcers Psychiatry: Judgement and insight poor Mittens in place as he poses danger to himself - pulling on lines and tube   Data Reviewed:   CBC:  Recent Labs Lab 08/30/16 0715 08/31/16 0344 09/02/16 0337 09/03/16 0455 09/04/16 0243  WBC 28.1* 29.7* 38.0* 31.4* 24.6*  HGB 11.7* 10.7* 11.4* 10.6* 9.8*  HCT 36.3* 33.4* 36.0* 33.0* 30.7*  MCV 119.0* 116.8* 119.2* 117.4* 118.5*  PLT 73* 70* 82* 92* 79*   Basic Metabolic Panel:  Recent Labs Lab 09/01/16 1211 09/02/16 0337 09/03/16 0455 09/04/16 0243 09/05/16 0801  NA 148* 148* 150* 153* 155*  K 3.6  4.0 4.4 3.3* 4.0  CL 123* 121* 122* 125* 128*  CO2 14* 15* 16* 15* 12*  GLUCOSE 204* 267* 125* 71 140*  BUN 60* 66* 80* 85* 92*  CREATININE 2.68* 3.32* 4.20* 4.51* 4.82*  CALCIUM 8.5* 8.5* 8.6* 8.7* 8.7*  MG  --  2.6* 2.8* 3.0* 3.0*   GFR: Estimated Creatinine Clearance: 25.3 mL/min (A) (by C-G formula based on SCr of 4.82 mg/dL (H)). Liver Function Tests:  Recent Labs Lab 09/01/16 1211 09/02/16 0337 09/03/16 0455 09/04/16 0243 09/05/16 0801  AST 133* 137* 133* 92* 54*  ALT 111* 115* 108* 86* 58  ALKPHOS 352* 388* 317* 248* 180*  BILITOT 44.3* 43.2* 46.2* 47.0* 46.1*  PROT 5.0* 4.9* 5.0* 5.3* 5.0*  ALBUMIN 2.4* 2.2* 3.1* 3.7 3.6   No results for input(s): LIPASE, AMYLASE in the last 168 hours.  Recent Labs Lab 08/30/16 0313 09/02/16 1450  AMMONIA 22 124*   Coagulation Profile: No results for input(s): INR, PROTIME in the last 168 hours. Cardiac Enzymes: No results for input(s): CKTOTAL, CKMB, CKMBINDEX, TROPONINI in the last 168 hours. BNP (last 3 results) No results for input(s): PROBNP  in the last 8760 hours. HbA1C: No results for input(s): HGBA1C in the last 72 hours. CBG:  Recent Labs Lab 09/04/16 0938 09/04/16 1151 09/04/16 1657 09/04/16 2111 09/05/16 0816  GLUCAP 132* 96 108* 127* 115*   Lipid Profile: No results for input(s): CHOL, HDL, LDLCALC, TRIG, CHOLHDL, LDLDIRECT in the last 72 hours. Thyroid Function Tests: No results for input(s): TSH, T4TOTAL, FREET4, T3FREE, THYROIDAB in the last 72 hours. Anemia Panel: No results for input(s): VITAMINB12, FOLATE, FERRITIN, TIBC, IRON, RETICCTPCT in the last 72 hours. Sepsis Labs: No results for input(s): PROCALCITON, LATICACIDVEN in the last 168 hours.  Recent Results (from the past 240 hour(s))  Culture, blood (routine x 2)     Status: None (Preliminary result)   Collection Time: 09/02/16  2:50 PM  Result Value Ref Range Status   Specimen Description BLOOD LEFT ANTECUBITAL  Final   Special  Requests   Final    BOTTLES DRAWN AEROBIC ONLY Blood Culture results may not be optimal due to an inadequate volume of blood received in culture bottles   Culture NO GROWTH 2 DAYS  Final   Report Status PENDING  Incomplete  Culture, blood (routine x 2)     Status: None (Preliminary result)   Collection Time: 09/02/16  2:50 PM  Result Value Ref Range Status   Specimen Description BLOOD BLOOD LEFT HAND  Final   Special Requests   Final    BOTTLES DRAWN AEROBIC ONLY Blood Culture results may not be optimal due to an inadequate volume of blood received in culture bottles   Culture NO GROWTH 2 DAYS  Final   Report Status PENDING  Incomplete         Radiology Studies: No results found.      Scheduled Meds: . feeding supplement (GLUCERNA SHAKE)  237 mL Oral TID BM  . folic acid  1 mg Oral Daily  . insulin aspart  0-15 Units Subcutaneous TID WC  . lactulose  20 g Oral BID  . multivitamin with minerals  1 tablet Oral Daily  . pantoprazole  40 mg Oral Q0600  . prednisoLONE  40 mg Oral Daily  . rifaximin  550 mg Oral BID  . thiamine  100 mg Oral Daily   Continuous Infusions: . cefTAZidime (FORTAZ)  IV Stopped (09/04/16 1927)  . metronidazole 500 mg (09/05/16 0631)     LOS: 14 days    Time spent: 35 mins     Bryla Burek Joline Maxcy, MD [facility] Pager [phone]   If 7PM-7AM, please contact night-coverage [email or web address] Password [email or web address] 09/05/2016, 9:50 AM

## 2016-09-05 NOTE — Progress Notes (Signed)
Patient down to IR for paracentesis after RNs clean him up from another bowel movement,

## 2016-09-05 NOTE — Care Management (Addendum)
CM spoke with palliative care - plan is for pt to discharge to residential hospice tomorrow 7/4 - pt is scheduled to receive paracentesis today.  CSW consulted

## 2016-09-05 NOTE — Progress Notes (Signed)
Mother calls for update with patient.  Instructed patient is doing well.  Rested good with no medications for restlessness since he got back from paracentesis.  Mother was glad to hear he is doing well.

## 2016-09-05 NOTE — Progress Notes (Signed)
Responded to consult that pt's mom wished to speak to chaplain this morning re: [facility]. Pt is failing, not imminently, but certainly, from renal disease. Mom wishes him to receive last rites -- not urgently today, as she is taking this day by day now, but soon. We discussed possible timing. Pt's family was at [facility] when spiritual care dept. director Theda BelfastBob Hamilton was pastor there. She'd love to see Nadine CountsBob again, but is unsure whether she should wait till Monday for this purpose, since Nadine CountsBob is on vacation this week.   W/ tomorrow's being July 4th, we agreed that as a matter of course she'd put in consult for that purpose on Th morning or whenever thereafter she thought best, considering her daily med briefings on pt's condition. Also advised her that she can ask a nurse to page for a chaplain at any time -- and such a request can a;so be made any time to an [facility] -- in emergent circumstances. Chaplain available for f/u.   09/05/16 1200  Clinical Encounter Type  Visited With Patient and family together  Visit Type Initial;Psychological support;Spiritual support;Social support  Referral From Nurse  Spiritual Encounters  Spiritual Needs Emotional;Grief support  Stress Factors  Patient Stress Factors Health changes;Loss of control  Family Stress Factors Family relationships;Loss;Loss of control   Ephraim Hamburgerynthia A Christabelle Hanzlik, [address]

## 2016-09-05 NOTE — Progress Notes (Signed)
Occupational Therapy Discharge Patient Details Name: Tyler Clarke MRN: [id number] DOB: 08-13-1975 Today's Date: 09/05/2016 Time:  -     Patient discharged from OT services secondary to Pt is now full comfort care with plan to transfer to residential Hospice facility.  Please see latest therapy progress note for current level of functioning and progress toward goals.  OT to discharge at this time as Further OT not indicated.    GO    Tyler Clarke, OTR/L [phone]  Tyler Clarke, Tyler Clarke 09/05/2016, 11:12 AM

## 2016-09-05 NOTE — Progress Notes (Signed)
Nutrition Brief Note  Chart reviewed. Plans for pt to transfer to residential Hospice facility.  No further nutrition interventions warranted at this time.  Please re-consult as needed.   Roslyn SmilingStephanie Ashya Nicolaisen, MS, RD, LDN Pager # [phone] After hours/ weekend pager # [phone]

## 2016-09-05 NOTE — Progress Notes (Signed)
Daily Progress Note   Patient Name: Tyler Clarke       Date: 09/05/2016 DOB: 08/29/1975  Age: 41 y.o. MRN#: [id number] Attending Physician: Damita Lack, MD Primary Care Physician: Leonard Downing, MD Admit Date: 08/22/2016  Reason for Consultation/Follow-up: Establishing goals of care  Subjective: Tyler Clarke is alert today but still confused. No complaints but restless.   Length of Stay: 14  Current Medications: Scheduled Meds:  . feeding supplement (GLUCERNA SHAKE)  237 mL Oral TID BM  . lactulose  20 g Oral BID  . lidocaine (PF)      . pantoprazole (PROTONIX) IV  40 mg Intravenous Q24H  . prednisoLONE  40 mg Oral Daily  . rifaximin  550 mg Oral BID    Continuous Infusions: . cefTAZidime (FORTAZ)  IV Stopped (09/05/16 1834)  . metronidazole Stopped (09/05/16 1447)    PRN Meds: fentaNYL (SUBLIMAZE) injection, glycopyrrolate, haloperidol, ipratropium-albuterol, LORazepam  Physical Exam     Constitutional: He appears well-developed. He has a sickly appearance.  Jaundice   HENT:  Head: Normocephalic and atraumatic.  Cardiovascular: Normal rate and regular rhythm.   Pulmonary/Chest: Effort normal. No accessory muscle usage. No tachypnea. No respiratory distress.  Abdominal: Soft. He exhibits distension and ascites.  Neurological: He is disoriented.  Nursing note and vitals reviewed.  Vital Signs: BP 94/61   Pulse 85   Temp 98 F (36.7 C) (Oral)   Resp (!) 24   Ht _0  (1.93 m)   Wt 91.2 kg (201 lb)   SpO2 99%   BMI 24.47 kg/m  SpO2: SpO2: 99 % O2 Device: O2 Device: Not Delivered O2 Flow Rate: O2 Flow Rate (L/min): 2 L/min  Intake/output summary:   Intake/Output Summary (Last 24 hours) at 09/05/16 1839 Last data filed at 09/05/16 1525  Gross per 24  hour  Intake               60 ml  Output              850 ml  Net             -790 ml   LBM: Last BM Date: 09/05/16 Baseline Weight: Weight: 83.9 kg (185 lb) Most recent weight: Weight: 91.2 kg (201 lb)       Palliative Assessment/Data: 20%  Flowsheet Rows     Most Recent Value  Intake Tab  Referral Department  Hospitalist  Unit at Time of Referral  Med/Surg Unit  Palliative Care Primary Diagnosis  Other (Comment) [liver disease]  Date Notified  09/03/16  Palliative Care Type  New Palliative care  Reason for referral  Clarify Goals of Care  Date of Admission  08/22/16  Date first seen by Palliative Care  09/03/16  # of days Palliative referral response time  0 Day(s)  # of days IP prior to Palliative referral  12  Clinical Assessment  Psychosocial & Spiritual Assessment  Palliative Care Outcomes      Patient Active Problem List   Diagnosis Date Noted  . Goals of care, counseling/discussion   . Palliative care encounter   . Acute kidney failure (Tyler Clarke)   . Alcoholic hepatitis without ascites   . Encephalopathy, hepatic ([place])   . Encephalopathy 08/24/2016  . Pressure injury of skin 08/23/2016  . Liver failure ([place]) 08/22/2016  . Hepatitis   . Alcoholic hepatitis with ascites   . Alcoholic cirrhosis of liver without ascites ([place])   . Low back pain 07/21/2013  . DIABETES MELLITUS 03/31/2010  . RENAL CALCULUS, HX OF 03/31/2010    Palliative Care Assessment & Plan   HPI: 41 y.o. male  with past medical history of current smoker, heavy alcohol abuse, diet controlled DM, polycythemia, prepatellar bursitis of right knee admitted on 08/22/2016 from PCP office for abnormal labs, slurred speech, falls and jaundice. New diagnosis of advanced alcoholic liver cirrhosis and hepatitis. 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 Palliative care consulted to assist with  [place] with continued decline.    Assessment: I spoke with Tyler Clarke's legal wife today via phone and she confirms that Tyler Clarke's mother is the decision maker although she appreciates updates and wishes to be a support to his mother during this time as this is her main concern. They have been having good communication and all questions/concerns addressed. She is fully aware that he is at EOL.   I met again today after speaking with wife, Tyler Clarke, with [name]'s mother, Tyler Clarke and I discussed his continued decline and discussed focusing his care more on comfort at this time. Tyler Clarke agrees. See below:  Recommendations/Plan:  Proceed with palliative paracentesis for comfort  Diet for comfort feeds  PO lactulose if able to take (NO FURTHER RECTAL LACTULOSE)  PRN meds for comfort as needed  Pursue hospice placement for EOL care  No more labs, cardiac monitoring to lessen agitation  Goals of Care and Additional Recommendations:  Limitations on Scope of Treatment: Minimize Medications, Initiate Comfort Feeding, No Diagnostics and No Hemodialysis  Code Status:  DNR  Prognosis:   Hours - Days  Discharge Planning:  Hospice facility  Care plan was discussed with Dr. Reesa Chew, RN  Thank you for allowing the Palliative Medicine Team to assist in the care of this patient.   Total Time 74mn Prolonged Time Billed  no       Greater than 50%  of this time was spent counseling and coordinating care related to the above assessment and plan.  AVinie Sill NP Palliative Medicine Team Pager # [phone](M-F 8a-5p) Team Phone # [phone](Nights/Weekends)

## 2016-09-05 NOTE — Progress Notes (Signed)
CSW received referral for residential hospice- [facility] and per palliative plan for DC to hospice tomorrow  [facility]  CSW will continue to follow  Burna SisJenna H. Machel Violante, LCSW Clinical Social Worker [phone]

## 2016-09-05 NOTE — Progress Notes (Signed)
Received patient from IR after Paracentesis resting well without any complaints.  2 Liters of yellow fluid drained off off his abdomen.

## 2016-09-06 DIAGNOSIS — Z515 Encounter for palliative care: Secondary | ICD-10-CM

## 2016-09-06 DIAGNOSIS — R0600 Dyspnea, unspecified: Secondary | ICD-10-CM

## 2016-09-06 DIAGNOSIS — K7031 Alcoholic cirrhosis of liver with ascites: Secondary | ICD-10-CM

## 2016-09-06 DIAGNOSIS — R0682 Tachypnea, not elsewhere classified: Secondary | ICD-10-CM

## 2016-09-06 LAB — BASIC METABOLIC PANEL
Anion gap: 16 — ABNORMAL HIGH (ref 5–15)
BUN: 107 mg/dL — AB (ref 6–20)
CHLORIDE: 129 mmol/L — AB (ref 101–111)
CO2: 11 mmol/L — ABNORMAL LOW (ref 22–32)
CREATININE: 5.72 mg/dL — AB (ref 0.61–1.24)
Calcium: 8.6 mg/dL — ABNORMAL LOW (ref 8.9–10.3)
GFR calc Af Amer: 13 mL/min — ABNORMAL LOW (ref >60)
GFR calc non Af Amer: 11 mL/min — ABNORMAL LOW (ref >60)
GLUCOSE: 227 mg/dL — AB (ref 65–99)
Potassium: 3.9 mmol/L (ref 3.5–5.1)
Sodium: 156 mmol/L — ABNORMAL HIGH (ref 135–145)

## 2016-09-06 LAB — GLUCOSE, CAPILLARY
Glucose-Capillary: 218 mg/dL — ABNORMAL HIGH (ref 65–99)
Glucose-Capillary: 266 mg/dL — ABNORMAL HIGH (ref 65–99)

## 2016-09-06 LAB — VANCOMYCIN, RANDOM: Vancomycin Rm: 31

## 2016-09-06 MED ORDER — LORAZEPAM 2 MG/ML IJ SOLN: 1.0000 mg | INTRAMUSCULAR | 0 refills | Status: AC | PRN

## 2016-09-06 MED ORDER — HALOPERIDOL LACTATE 2 MG/ML PO CONC: 2.0000 mg | Freq: Four times a day (QID) | ORAL | 0 refills | Status: AC | PRN

## 2016-09-06 MED ORDER — GLYCOPYRROLATE 0.2 MG/ML IJ SOLN: 0.2000 mg | INTRAMUSCULAR | 0 refills | Status: AC | PRN

## 2016-09-06 MED ORDER — IPRATROPIUM-ALBUTEROL 0.5-2.5 (3) MG/3ML IN SOLN
3.0000 mL | RESPIRATORY_TRACT | 0 refills | Status: AC | PRN
Start: 2016-09-06 — End: ?

## 2016-09-06 MED ORDER — FENTANYL CITRATE (PF) 100 MCG/2ML IJ SOLN: 25.0000 ug | INTRAMUSCULAR | 0 refills | Status: AC | PRN

## 2016-09-06 MED ORDER — GLUCERNA SHAKE PO LIQD: 237.0000 mL | Freq: Three times a day (TID) | ORAL | 0 refills | Status: AC

## 2016-09-07 LAB — PH, BODY FLUID: pH, Body Fluid: 7.6

## 2016-09-07 LAB — CULTURE, BLOOD (ROUTINE X 2)
CULTURE: NO GROWTH
Culture: NO GROWTH

## 2016-09-10 LAB — CULTURE, BODY FLUID-BOTTLE: CULTURE: NO GROWTH

## 2016-09-10 LAB — CULTURE, BODY FLUID W GRAM STAIN -BOTTLE

## 2016-10-04 NOTE — Progress Notes (Signed)
Pharmacy Antibiotic Note  Tyler Clarke is a 41 y.o. male admitted on 08/22/2016 with acute alcoholic hepatitis with cirrhosis and ascites. Now with new concerns for sepsis and pharmacy was consulted to start Vancomycin + Ceftazidime + Flagyl for empiric coverage.  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  A random vancomycin level this morning is elevated at 7331mcg/mL.  Plan: - Hold vancomycin. Recheck level on 7/6 at the earliest if to continue - Ceftazidime 2g IV 24h - Flagyl 500mg  IV q8h - Noted plans [facility]  Height: 6\' 4"  (193 cm) Weight: 201 lb (91.2 kg) IBW/kg (Calculated) : 86.8  Temp (24hrs), Avg:97.9 F (36.6 C), Min:97.5 F (36.4 C), Max:98.5 F (36.9 C)   Recent Labs Lab 08/31/16 0344  09/02/16 0337 09/03/16 0455 09/04/16 0243 09/05/16 0801 2016/08/10 0635  WBC 29.7*  --  38.0* 31.4* 24.6*  --   --   CREATININE 2.42*  < > 3.32* 4.20* 4.51* 4.82* 5.72*  VANCORANDOM  --   --   --   --  18  --  31  < > = values in this interval not displayed.  Estimated Creatinine Clearance: 21.3 mL/min (A) (by C-G formula based on SCr of 5.72 mg/dL (H)).    No Known Allergies  Tyler Clarke, PharmD, BCPS Clinical Pharmacist Pager: [phone] Clinical Phone for 09/30/2016 until 3:30pm: [phone] If after 3:30pm, please call main pharmacy at [phone] 09/25/2016 10:17 AM

## 2016-10-04 NOTE — Progress Notes (Signed)
Discharge Note:   [facility] to [facility]. [facility].

## 2016-10-04 NOTE — Consult Note (Signed)
[facility] room available for Tyler Clarke this morning. Met with his mother Tyler Clarke to complete paper work for transfer today. CSW Eliezer Lofts is aware.   Please fax discharge summary to [phone].  RN please call report to [phone].  Thank you,  Erling Conte, LCSW [phone]

## 2016-10-04 NOTE — Progress Notes (Signed)
[facility] Anticipated discharge date: 7/4 Family notified: mom [facility]- scheduled for 12:40pm  CSW signing off.  Burna SisJenna H. Rilea Arutyunyan, LCSW Clinical Social Worker [phone]

## 2016-10-04 NOTE — Progress Notes (Signed)
Physical Therapy Discharge Patient Details Name: Tyler Clarke MRN: [id number] DOB: 04/03/1975 Today's Date: 09/20/2016 Time:  -     Patient discharged from PT services secondary to medical decline - will need to re-order PT to resume therapy services.Patient discharged from PT services secondary to pt is now full comfort care with plan to transfer to residential Hospice facility.  Please see latest therapy progress note for current level of functioning and progress toward goals.  PT to discharge at this time as further PT not indicated.       Berline LopesDawn F Avey Mcmanamon 09/11/2016, 7:31 AM  Eber Jonesawn Catrice Zuleta,PT Acute Rehabilitation [phone] (pager)

## 2016-10-04 NOTE — Care Management Important Message (Signed)
Important Message  Patient Details  Name: Tyler Clarke MRN: [id number] Date of Birth: [birth year]   Medicare Important Message Given:  Yes    Tyler Clarke Abena 09/05/2016, 9:25 AM

## 2016-10-04 NOTE — Discharge Summary (Signed)
Physician Discharge Summary  Tyler Clarke ZOX:[id number] DOB: 04-06-1975 DOA: 08/22/2016  PCP: Kaleen Mask, MD  Admit date: 08/22/2016 Discharge date: Sep 10, 2016  Admitted From: Home Disposition:  Hospice Facility   Recommendations for Outpatient Follow-up:  1. Follow up Care per Hospice Protocol   Home Health: No Equipment/Devices: None    Discharge Condition: Comfort Care CODE STATUS: DO NOT RESUSCITATE  Diet recommendation: Dysphagia 3 Diet  Brief/Interim Summary: The patient is a 41 year old male with history of heavy alcohol use, diabetes type 2 came to the ER after having abnormal labs outpatient. Patient was found to be in decompensated liver cirrhosis, new diagnosis secondary to alcohol abuse. He has been getting treatment for acute alcoholic hepatitis and renal failure. After a few days of treatment with lactulose, octreotide and albumin patient's mental status was improving but his alcohol hepatitis still continued to worsen. On 09/03/2016 due to failure of improvement in his mental status and worsening renal function, hepatic function and low blood pressure he was transferred to stepdown unit. Seen by palliative care as we think he may not improve much and family elected to pursue Comfort Measures. Patient will be transferred to [facility] today and will have follow up Care per Hospice Protocol.   Discharge Diagnoses:  Active Problems:   Liver failure (HCC)   Alcoholic cirrhosis of liver without ascites (HCC)   Pressure injury of skin   Encephalopathy   Alcoholic hepatitis without ascites   Encephalopathy, hepatic (HCC)   Goals of care, counseling/discussion   Palliative care encounter   Acute kidney failure (HCC)  Decompensated liver cirrhosis likely secondary to alcohol use Moderate alcoholic hepatitis; worsening  -Gastroenterology rec'ss noted. Continue prednisone (day 15 ). Plan to complete 28 day course steroids. Not improving much on steroids, Will stop  now that patient is going on Comfort Measures -Acute hepatitis panel negative. Abdominal ultrasound showed hepatic cirrhosis. Repeat Limited US now shows mod to large ascites. Paracentesis done 09/05/16 yielded 2 Liters  -Discontinued Lactulose and rifaximin. Rectal lactulose prn stopped as well now that patient is on comfort Measures -Status post 1 week of Rocephin treatment. But due to worsening mental status and leukocytosis we will restart broad-spectrum antibiotics vancomycin D4; Ceftazidime D3 and flagyl D4 per pharm recs) -AFP checked on 08/22/2016-normal -Supportive care -Given recent history of alcohol drinking he's not a transplant candidate at this time. -Patient to go on Comfort Measures   Hepatic encephalopathy -Underlying infections and/or worsening encephalopathy  -Discontinued lactulose (PO and rectal depending on his mental status) and IV antibiotics for presumed infection now that he is Comfort Measures  -Dysphagia 3 Diet  Acute kidney injury Hepatorenal syndrome type I;  Continues to worsen.  Was on IV albumin and octreotide and administered Midodrine 3 times daily once his mental status improves, unable to take po this morning. Creatinine continues to worsen. Closely monitor urine output, Foley in place -Palliative care following and patient now Comfort Measures   Hypernatremia -Elevated sodium at 156. Encourage oral liquid.  -Ideally will stop Lactulose now that patient is Comfort  Thrombocytopenia  -likely secondary to liver disease  Hypoalbuminemia -Albumin supplements were added due to HRS type I but now D/C'd  Diabetes type 2 -D/C'd on insulin regimen. Continue at this time  Alcohol abuse -CIWA protocol was now stopped that family has elected comfort Measures  Discharge Instructions  Discharge Instructions    Call MD for:  difficulty breathing, headache or visual disturbances    Complete by:  As directed  Call MD for:  extreme fatigue     Complete by:  As directed    Call MD for:  hives    Complete by:  As directed    Call MD for:  persistant dizziness or light-headedness    Complete by:  As directed    Call MD for:  persistant nausea and vomiting    Complete by:  As directed    Call MD for:  redness, tenderness, or signs of infection (pain, swelling, redness, odor or green/yellow discharge around incision site)    Complete by:  As directed    Call MD for:  severe uncontrolled pain    Complete by:  As directed    Call MD for:  temperature >100.4    Complete by:  As directed    Diet - low sodium heart healthy    Complete by:  As directed    Discharge instructions    Complete by:  As directed    Follow up Care at Hospice per Protocol   Increase activity slowly    Complete by:  As directed      Allergies as of 2016-09-30   No Known Allergies     Medication List    STOP taking these medications   amitriptyline 10 MG tablet Commonly known as:  ELAVIL   fexofenadine 30 MG tablet Commonly known as:  ALLEGRA   ibuprofen 200 MG tablet Commonly known as:  ADVIL,MOTRIN   magnesium oxide 400 MG tablet Commonly known as:  MAG-OX   metFORMIN 1000 MG tablet Commonly known as:  GLUCOPHAGE   potassium chloride SA 20 MEQ tablet Commonly known as:  K-DUR,KLOR-CON     TAKE these medications   feeding supplement (GLUCERNA SHAKE) Liqd Take 237 mLs by mouth 3 (three) times daily between meals.   fentaNYL 100 MCG/2ML injection Commonly known as:  SUBLIMAZE Inject 0.5 mLs (25 mcg total) into the vein every 2 (two) hours as needed for severe pain (dyspnea).   glycopyrrolate 0.2 MG/ML injection Commonly known as:  ROBINUL Inject 1 mL (0.2 mg total) into the vein every 4 (four) hours as needed (secretions, gurgling).   haloperidol 2 MG/ML solution Commonly known as:  HALDOL Take 1 mL (2 mg total) by mouth every 6 (six) hours as needed for agitation.   ipratropium-albuterol 0.5-2.5 (3) MG/3ML Soln Commonly known as:   DUONEB Take 3 mLs by nebulization every 4 (four) hours as needed.   LORazepam 2 MG/ML injection Commonly known as:  ATIVAN Inject 0.5 mLs (1 mg total) into the vein every 4 (four) hours as needed for anxiety or seizure.       No Known Allergies  Consultations:  Gastroenterology  Nephrololgy  PCCM/Pulmonary  Interventional Radiology  Palliative Care Medicine  Procedures/Studies: Ct Head Wo Contrast  Result Date: 08/22/2016 CLINICAL DATA:  Multiple falls at home. Dizziness with standing. Diabetes. EXAM: CT HEAD WITHOUT CONTRAST TECHNIQUE: Contiguous axial images were obtained from the base of the skull through the vertex without intravenous contrast. COMPARISON:  None. FINDINGS: Brain: Mild cerebral and cerebellar volume loss for age. No mass lesion, hemorrhage, hydrocephalus, acute infarct, intra-axial, or extra-axial fluid collection. No mass lesion, hemorrhage, hydrocephalus, acute infarct, intra-axial, or extra-axial fluid collection. Vascular: No hyperdense vessel or unexpected calcification. Skull: No significant soft tissue swelling.  No skull fracture. Sinuses/Orbits: Normal imaged portions of the orbits and globes. Hypoplastic right frontal sinus. Other paranasal sinuses and mastoid air cells clear. Other: None. IMPRESSION: No acute intracranial abnormality. Electronically Signed  By: Jeronimo Greaves M.D.   On: 08/22/2016 17:25   US Abdomen Complete  Result Date: 08/22/2016 CLINICAL DATA:  Acute onset of jaundice. Assess for cirrhosis. Initial encounter. EXAM: ABDOMEN ULTRASOUND COMPLETE COMPARISON:  Right upper quadrant ultrasound performed 07/27/2016 FINDINGS: Gallbladder: Gallbladder wall thickening is noted. This is nonspecific in the presence of ascites. Underlying sludge is noted within the gallbladder. No definite stones are seen. No ultrasonographic Murphy's sign is elicited. Common bile duct: Diameter: 0.5 cm, within normal limits in caliber. Liver: A 1.4 cm cyst is  noted at the left hepatic lobe. The nodular contour of the liver is compatible with hepatic cirrhosis. Small volume ascites is seen tracking about the liver. IVC: Not well characterized. Pancreas: Not well seen. Spleen: Size and appearance within normal limits. Right Kidney: Length: 12.8 cm. Echogenicity within normal limits. No mass or hydronephrosis visualized. Left Kidney: Length: 11.6 cm. Echogenicity within normal limits. No mass or hydronephrosis visualized. Abdominal aorta: Not characterized due to the patient's habitus. Other findings: None. IMPRESSION: 1. Small volume ascites noted about the liver. Nodular contour of the liver is compatible with hepatic cirrhosis. 2. 1.4 cm cyst at the left hepatic lobe. 3. Gallbladder wall thickening is nonspecific in the presence of ascites. Underlying gallbladder sludge noted. No definite stones seen. Electronically Signed   By: Roanna Raider M.D.   On: 08/22/2016 22:33   US Abdomen Limited  Result Date: 09/02/2016 CLINICAL DATA:  Ascites. EXAM: LIMITED ABDOMEN ULTRASOUND FOR ASCITES TECHNIQUE: Limited ultrasound survey for ascites was performed in all four abdominal quadrants. COMPARISON:  08/22/2016 FINDINGS: Increasing ascites since the prior study with moderate to large volume ascites, most pronounced in the right abdomen and left lower quadrant. IMPRESSION: Moderate to large volume ascites. Electronically Signed   By: Charlett Nose M.D.   On: 09/02/2016 16:14   Dg Chest Port 1 View  Result Date: 09/02/2016 CLINICAL DATA:  Dyspnea, diabetes mellitus, smoker EXAM: PORTABLE CHEST 1 VIEW COMPARISON:  Portable exam at 1351 hrs compared to 08/30/2016 FINDINGS: Normal heart size, mediastinal contours, and pulmonary vascularity. Lungs clear. No pleural effusion or pneumothorax. Old fracture posterolateral RIGHT seventh rib. Bones otherwise unremarkable. IMPRESSION: No acute abnormalities. Electronically Signed   By: Ulyses Southward M.D.   On: 09/02/2016 14:20   Dg  Chest Port 1 View  Result Date: 08/30/2016 CLINICAL DATA:  Tachypnea EXAM: PORTABLE CHEST 1 VIEW COMPARISON:  08/22/2016 chest radiograph. FINDINGS: Stable cardiomediastinal silhouette with normal heart size. No pneumothorax. No pleural effusion. Lungs appear clear, with no acute consolidative airspace disease and no pulmonary edema. Stable healed deformity in the lateral right mid rib. IMPRESSION: No active cardiopulmonary disease. Electronically Signed   By: Delbert Phenix M.D.   On: 08/30/2016 12:17   Dg Chest Port 1 View  Result Date: 08/22/2016 CLINICAL DATA:  Hepatitis. EXAM: PORTABLE CHEST 1 VIEW COMPARISON:  Radiograph of December 28, 2013. FINDINGS: The heart size and mediastinal contours are within normal limits. Both lungs are clear. No pneumothorax or pleural effusion is noted. Old right rib fracture is noted. IMPRESSION: No acute cardiopulmonary abnormality seen. Electronically Signed   By: Lupita Raider, M.D.   On: 08/22/2016 21:22   Ir Paracentesis  Result Date: 09/05/2016 INDICATION: Patient with history of cirrhosis, now with ascites. Request is made for diagnostic and therapeutic paracentesis of up to 2 L. EXAM: ULTRASOUND GUIDED DIAGNOSTIC AND THERAPEUTIC PARACENTESIS MEDICATIONS: 10 mL 1% lidocaine COMPLICATIONS: None immediate. PROCEDURE: Informed written consent was obtained from the patient  after a discussion of the risks, benefits and alternatives to treatment. A timeout was performed prior to the initiation of the procedure. Initial ultrasound scanning demonstrates a large amount of ascites within the right lateral abdomen. The right lateral abdomen was prepped and draped in the usual sterile fashion. 1% lidocaine was used for local anesthesia. Following this, a 19 gauge, 7-cm, Yueh catheter was introduced. An ultrasound image was saved for documentation purposes. The paracentesis was performed. The catheter was removed and a dressing was applied. The patient tolerated the procedure  well without immediate post procedural complication. FINDINGS: A total of approximately 2.0 liters of bright yellow fluid was removed. Samples were sent to the laboratory as requested by the clinical team. IMPRESSION: Successful ultrasound-guided diagnostic and therapeutic paracentesis yielding 2.0 liters of peritoneal fluid. Read by:  Loyce DysKacie Matthews PA-C Electronically Signed   By: Irish LackGlenn  Yamagata M.D.   On: 09/05/2016 13:06    Subjective: Seen and examined this AM and was a little confused and severely jaundiced. Was wearing safety mitts. Stated back, chest, and abdomen were hurting. No other complaints and [facility]  Discharge Exam: Vitals:   12/14/2016 0216 12/14/2016 0319  BP:  98/71  Pulse:    Resp:  (!) 21  Temp: 97.8 F (36.6 C) 97.8 F (36.6 C)   Vitals:   09/05/16 2310 12/14/2016 0000 12/14/2016 0216 12/14/2016 0319  BP: (!) 93/58 111/73  98/71  Pulse: 86 93    Resp: (!) 21 (!) 27  (!) 21  Temp: (!) 97.5 F (36.4 C)  97.8 F (36.6 C) 97.8 F (36.6 C)  TempSrc: Oral  Oral Axillary  SpO2: 98% 98%  97%  Weight:      Height:       General: Pt is awake, not in acute distress and severely jaundiced  Cardiovascular: RRR, S1/S2 +, no rubs, no gallops Respiratory: CTA bilaterally, no wheezing, no rhonchi Abdominal: Soft, NT, Distended with ascites, bowel sounds + Extremities: some edema, no cyanosis  The results of significant diagnostics from this hospitalization (including imaging, microbiology, ancillary and laboratory) are listed below for reference.    Microbiology: Recent Results (from the past 240 hour(s))  Culture, blood (routine x 2)     Status: None (Preliminary result)   Collection Time: 09/02/16  2:50 PM  Result Value Ref Range Status   Specimen Description BLOOD LEFT ANTECUBITAL  Final   Special Requests   Final    BOTTLES DRAWN AEROBIC ONLY Blood Culture results may not be optimal due to an inadequate volume of  blood received in culture bottles   Culture NO GROWTH 4 DAYS  Final   Report Status PENDING  Incomplete  Culture, blood (routine x 2)     Status: None (Preliminary result)   Collection Time: 09/02/16  2:50 PM  Result Value Ref Range Status   Specimen Description BLOOD BLOOD LEFT HAND  Final   Special Requests   Final    BOTTLES DRAWN AEROBIC ONLY Blood Culture results may not be optimal due to an inadequate volume of blood received in culture bottles   Culture NO GROWTH 4 DAYS  Final   Report Status PENDING  Incomplete  Culture, body fluid-bottle     Status: None (Preliminary result)   Collection Time: 09/05/16 12:00 PM  Result Value Ref Range Status   Specimen Description FLUID PERITONEAL  Final   Special Requests NONE  Final   Culture NO GROWTH < 24 HOURS  Final   Report Status PENDING  Incomplete  Gram stain     Status: None   Collection Time: 09/05/16 12:00 PM  Result Value Ref Range Status   Specimen Description FLUID PERITONEAL  Final   Special Requests NONE  Final   Gram Stain   Final    ABUNDANT WBC PRESENT, PREDOMINANTLY PMN NO ORGANISMS SEEN    Report Status 09/05/2016 FINAL  Final    Labs: BNP (last 3 results) No results for input(s): BNP in the last 8760 hours. Basic Metabolic Panel:  Recent Labs Lab 09/02/16 0337 09/03/16 0455 09/04/16 0243 09/05/16 0801 09/14/16 0635  NA 148* 150* 153* 155* 156*  K 4.0 4.4 3.3* 4.0 3.9  CL 121* 122* 125* 128* 129*  CO2 15* 16* 15* 12* 11*  GLUCOSE 267* 125* 71 140* 227*  BUN 66* 80* 85* 92* 107*  CREATININE 3.32* 4.20* 4.51* 4.82* 5.72*  CALCIUM 8.5* 8.6* 8.7* 8.7* 8.6*  MG 2.6* 2.8* 3.0* 3.0*  --    Liver Function Tests:  Recent Labs Lab 09/01/16 1211 09/02/16 0337 09/03/16 0455 09/04/16 0243 09/05/16 0801  AST 133* 137* 133* 92* 54*  ALT 111* 115* 108* 86* 58  ALKPHOS 352* 388* 317* 248* 180*  BILITOT 44.3* 43.2* 46.2* 47.0* 46.1*  PROT 5.0* 4.9* 5.0* 5.3* 5.0*  ALBUMIN 2.4* 2.2* 3.1* 3.7 3.6   No  results for input(s): LIPASE, AMYLASE in the last 168 hours.  Recent Labs Lab 09/02/16 1450  AMMONIA 124*   CBC:  Recent Labs Lab 08/31/16 0344 09/02/16 0337 09/03/16 0455 09/04/16 0243  WBC 29.7* 38.0* 31.4* 24.6*  HGB 10.7* 11.4* 10.6* 9.8*  HCT 33.4* 36.0* 33.0* 30.7*  MCV 116.8* 119.2* 117.4* 118.5*  PLT 70* 82* 92* 79*   Cardiac Enzymes: No results for input(s): CKTOTAL, CKMB, CKMBINDEX, TROPONINI in the last 168 hours. BNP: Invalid input(s): POCBNP CBG:  Recent Labs Lab 09/04/16 1151 09/04/16 1657 09/04/16 2111 09/05/16 0816 2016-09-14 0831  GLUCAP 96 108* 127* 115* 218*   D-Dimer No results for input(s): DDIMER in the last 72 hours. Hgb A1c No results for input(s): HGBA1C in the last 72 hours. Lipid Profile No results for input(s): CHOL, HDL, LDLCALC, TRIG, CHOLHDL, LDLDIRECT in the last 72 hours. Thyroid function studies No results for input(s): TSH, T4TOTAL, T3FREE, THYROIDAB in the last 72 hours.  Invalid input(s): FREET3 Anemia work up No results for input(s): VITAMINB12, FOLATE, FERRITIN, TIBC, IRON, RETICCTPCT in the last 72 hours. Urinalysis    Component Value Date/Time   COLORURINE AMBER (A) 09/02/2016 1549   APPEARANCEUR TURBID (A) 09/02/2016 1549   LABSPEC 1.005 09/02/2016 1549   PHURINE 6.0 09/02/2016 1549   GLUCOSEU 50 (A) 09/02/2016 1549   HGBUR SMALL (A) 09/02/2016 1549   BILIRUBINUR MODERATE (A) 09/02/2016 1549   KETONESUR NEGATIVE 09/02/2016 1549   PROTEINUR >=300 (A) 09/02/2016 1549   UROBILINOGEN 0.2 02/20/2010 1837   NITRITE NEGATIVE 09/02/2016 1549   LEUKOCYTESUR SMALL (A) 09/02/2016 1549   Sepsis Labs Invalid input(s): PROCALCITONIN,  WBC,  LACTICIDVEN Microbiology Recent Results (from the past 240 hour(s))  Culture, blood (routine x 2)     Status: None (Preliminary result)   Collection Time: 09/02/16  2:50 PM  Result Value Ref Range Status   Specimen Description BLOOD LEFT ANTECUBITAL  Final   Special Requests   Final     BOTTLES DRAWN AEROBIC ONLY Blood Culture results may not be optimal due to an inadequate volume of blood received in culture bottles  Culture NO GROWTH 4 DAYS  Final   Report Status PENDING  Incomplete  Culture, blood (routine x 2)     Status: None (Preliminary result)   Collection Time: 09/02/16  2:50 PM  Result Value Ref Range Status   Specimen Description BLOOD BLOOD LEFT HAND  Final   Special Requests   Final    BOTTLES DRAWN AEROBIC ONLY Blood Culture results may not be optimal due to an inadequate volume of blood received in culture bottles   Culture NO GROWTH 4 DAYS  Final   Report Status PENDING  Incomplete  Culture, body fluid-bottle     Status: None (Preliminary result)   Collection Time: 09/05/16 12:00 PM  Result Value Ref Range Status   Specimen Description FLUID PERITONEAL  Final   Special Requests NONE  Final   Culture NO GROWTH < 24 HOURS  Final   Report Status PENDING  Incomplete  Gram stain     Status: None   Collection Time: 09/05/16 12:00 PM  Result Value Ref Range Status   Specimen Description FLUID PERITONEAL  Final   Special Requests NONE  Final   Gram Stain   Final    ABUNDANT WBC PRESENT, PREDOMINANTLY PMN NO ORGANISMS SEEN    Report Status 09/05/2016 FINAL  Final   Time coordinating discharge: 25 minutes  SIGNED:  Merlene Laughter, DO [facility] 27-Sep-2016, 11:15 AM Pager [phone]  If 7PM-7AM, please contact night-coverage [email or web address] Password [email or web address]

## 2016-10-04 DEATH — deceased

## 2018-06-30 IMAGING — CT CT HEAD W/O CM
3 of 4 series · 14 of 47 positions shown, 16 images · non-contrast
Comparison: None.

CLINICAL DATA: Multiple falls at home. Dizziness with standing.
Diabetes.

EXAM:
CT HEAD WITHOUT CONTRAST
TECHNIQUE: Contiguous axial images were obtained from the base of the skull
through the vertex without intravenous contrast.

[Series 4: head 2.0 h70h · axial · 0.45mm/px · z∈[-61,+61]mm · 8 of 77 slices shown, 10 images]
[im 8/77  brain]
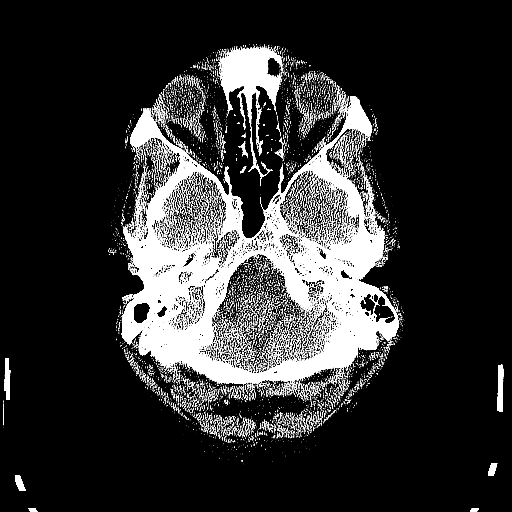
[im 8/77  bone]
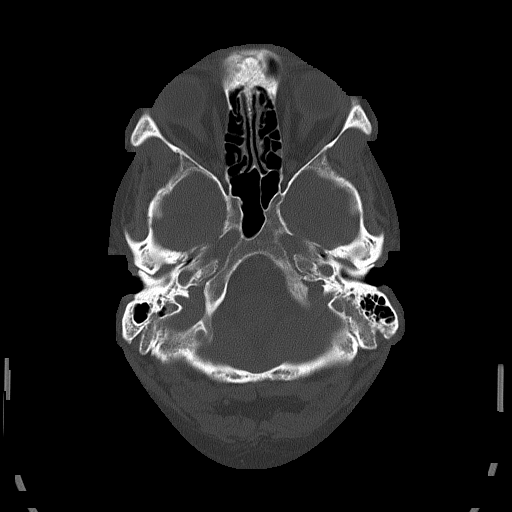
[im 16/77  brain]
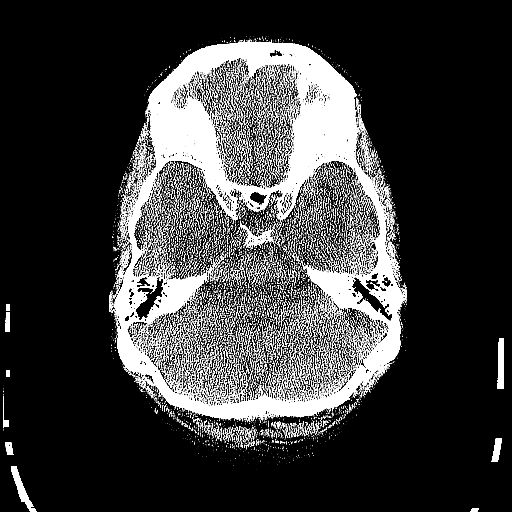
[im 23/77  brain]
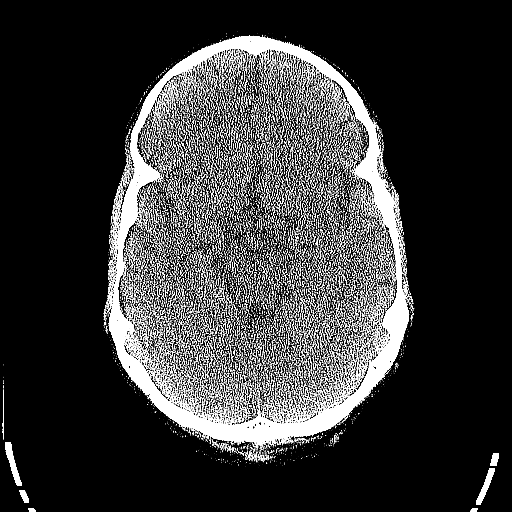
[im 35/77  brain]
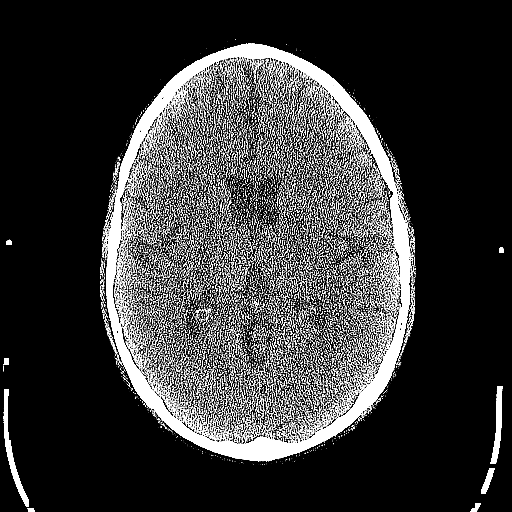
[im 42/77  brain]
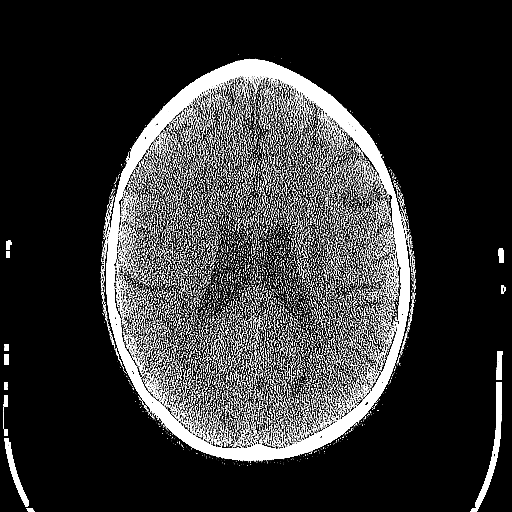
[im 42/77  bone]
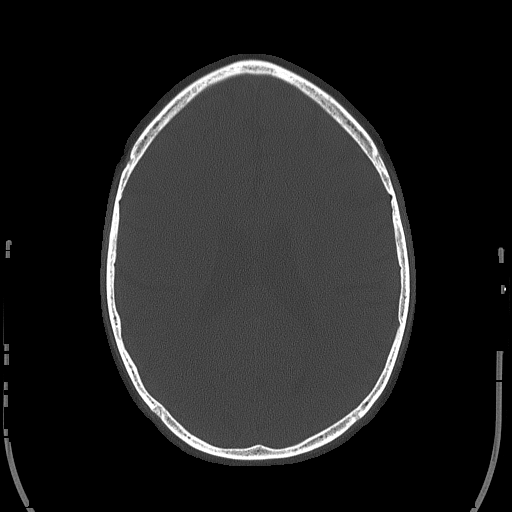
[im 54/77  brain]
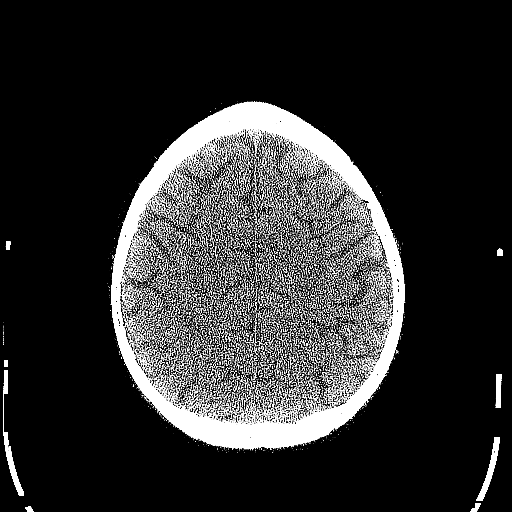
[im 61/77  brain]
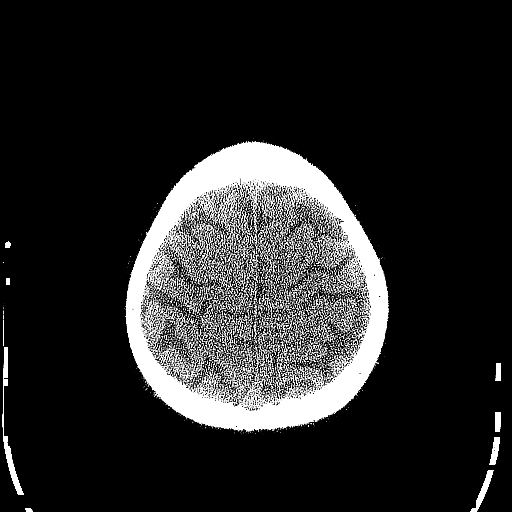
[im 69/77  brain]
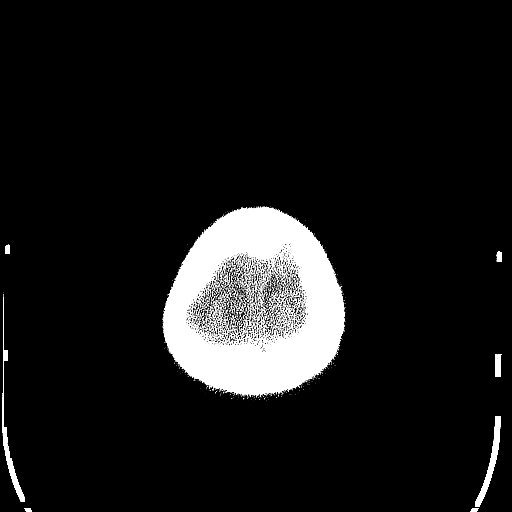

[Series 5: head 3.0 mpr cor · coronal · 0.31mm/px · 3 of 65 slices shown]
[im 22/65  brain]
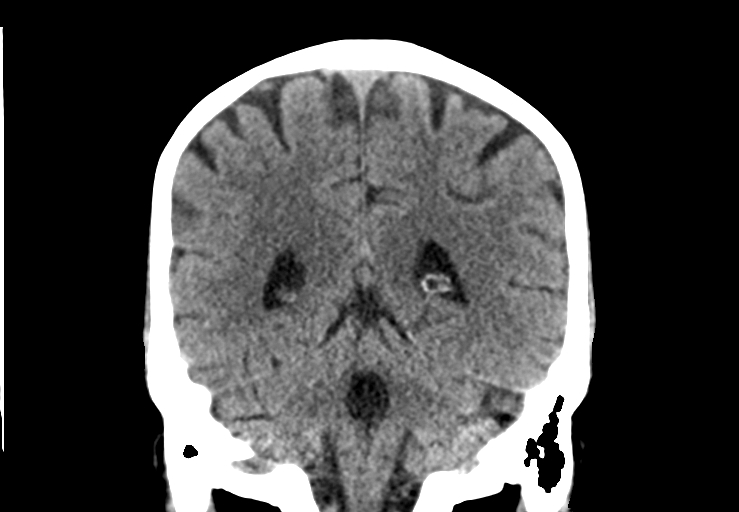
[im 29/65  brain]
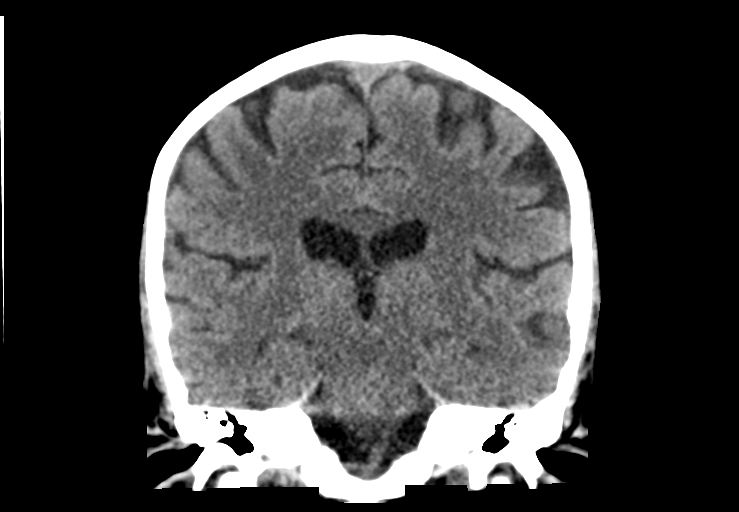
[im 36/65  brain]
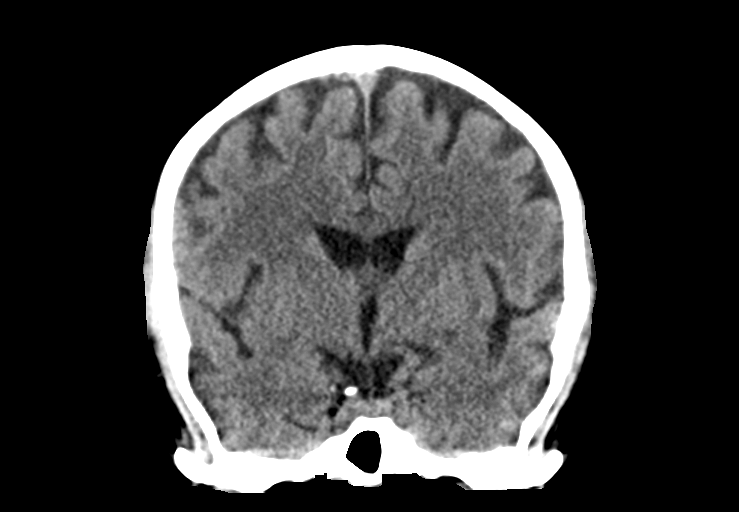

[Series 6: head 3.0 mpr sag · sagittal · 0.30mm/px · 3 of 49 slices shown]
[im 17/49  brain]
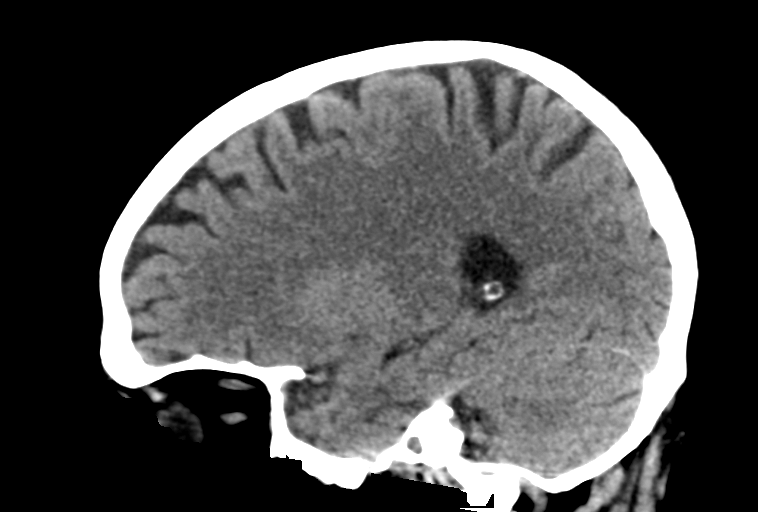
[im 25/49  brain]
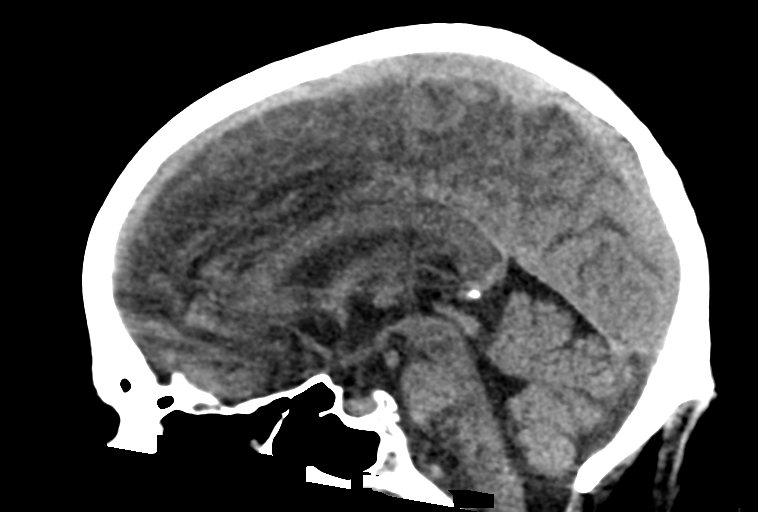
[im 33/49  brain]
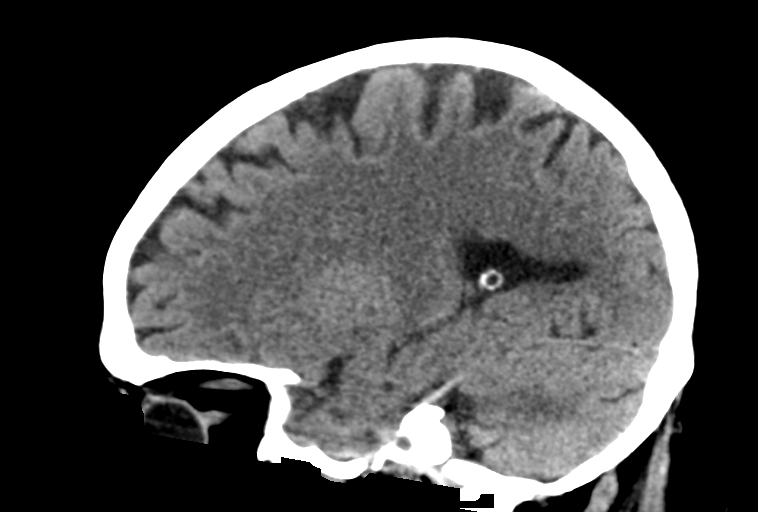

[14 of 47 positions shown; findings below may reference images not displayed]

FINDINGS: Brain: Mild cerebral and cerebellar volume loss for age. No mass
lesion, hemorrhage, hydrocephalus, acute infarct, intra-axial, or
extra-axial fluid collection. No mass lesion, hemorrhage,
hydrocephalus, acute infarct, intra-axial, or extra-axial fluid
collection.

Vascular: No hyperdense vessel or unexpected calcification.

Skull: No significant soft tissue swelling.  No skull fracture.

Sinuses/Orbits: Normal imaged portions of the orbits and globes.
Hypoplastic right frontal sinus. Other paranasal sinuses and mastoid
air cells clear.

Other: None.
IMPRESSION: No acute intracranial abnormality.

## 2018-06-30 IMAGING — DX DG CHEST 1V PORT
1 series · 1 of 1 positions shown · non-contrast
Comparison: Radiograph December 28, 2013.

CLINICAL DATA: Hepatitis.

EXAM:
PORTABLE CHEST 1 VIEW

[chest ap]
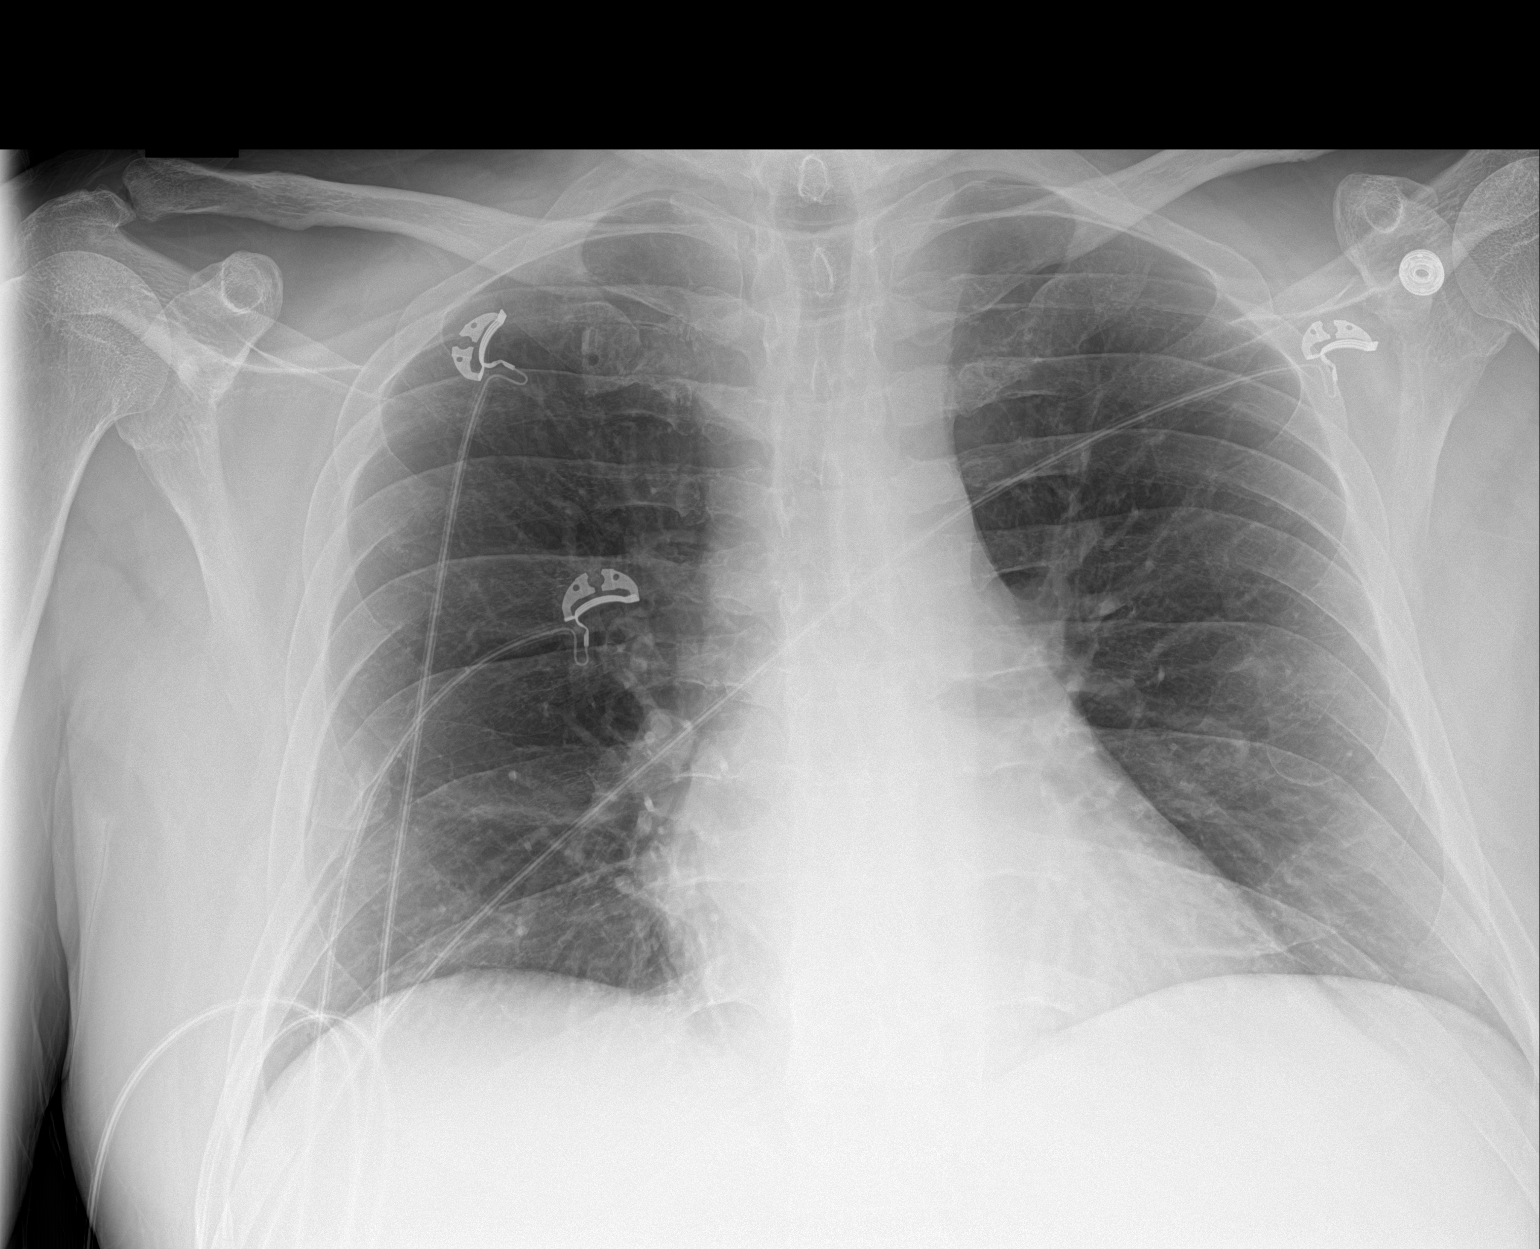

[1 of 1 positions shown; findings below may reference images not displayed]

FINDINGS: The heart size and mediastinal contours are within normal limits.
Both lungs are clear. No pneumothorax or pleural effusion is noted.
Old right rib fracture is noted.
IMPRESSION: No acute cardiopulmonary abnormality seen.

## 2019-05-14 IMAGING — US US ABDOMEN COMPLETE
1 series · 13 of 25 positions shown · non-contrast
Comparison: Right upper quadrant ultrasound performed 07/27/2016

CLINICAL DATA: Acute onset of jaundice. Assess for cirrhosis.
Initial encounter.

EXAM:
ABDOMEN ULTRASOUND COMPLETE

[Series 1: us abdomen complete · 0.25mm/px · 13 of 83 slices shown]
[im 1/83]
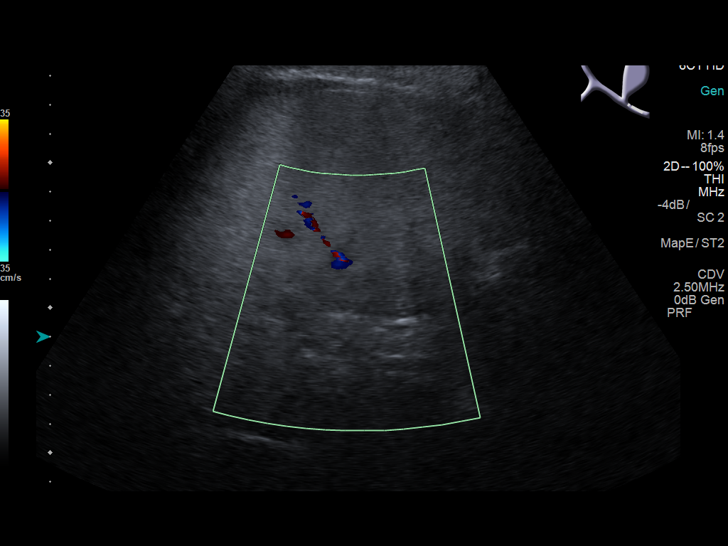
[im 7/83]
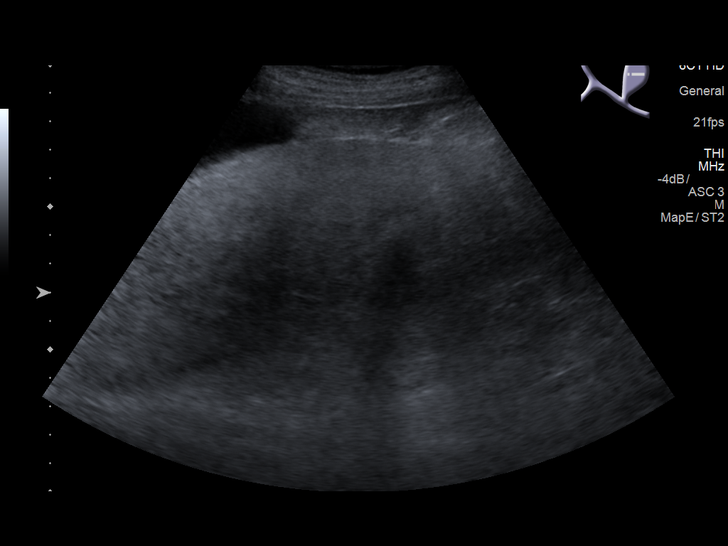
[im 14/83]
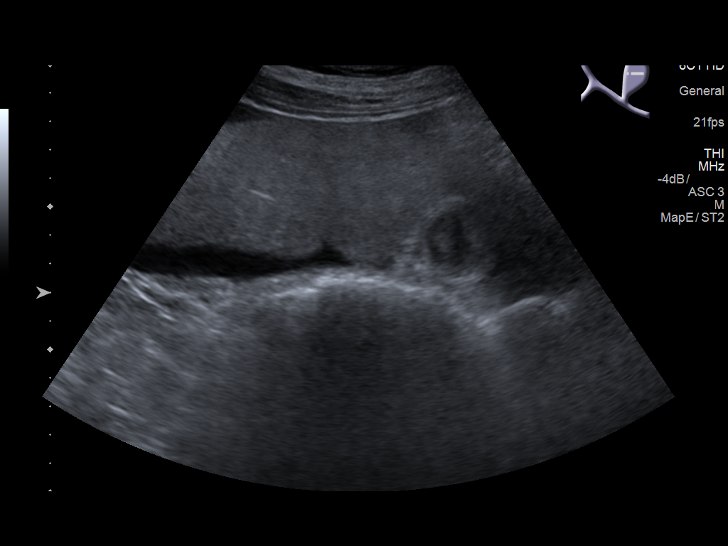
[im 21/83]
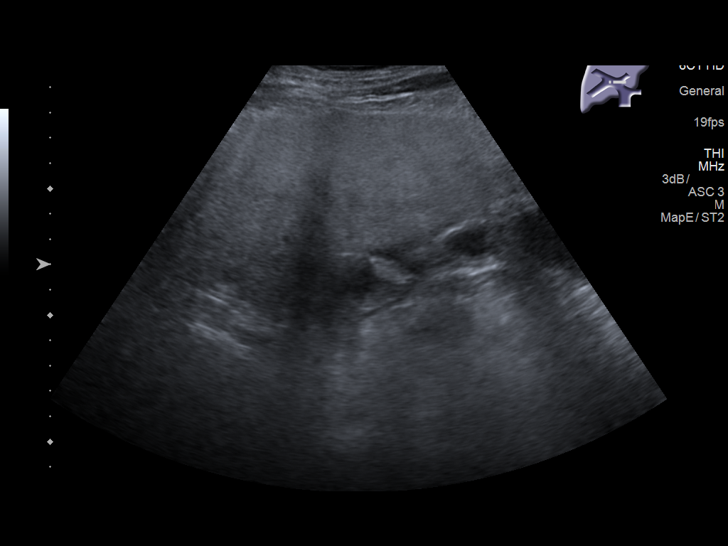
[im 28/83]
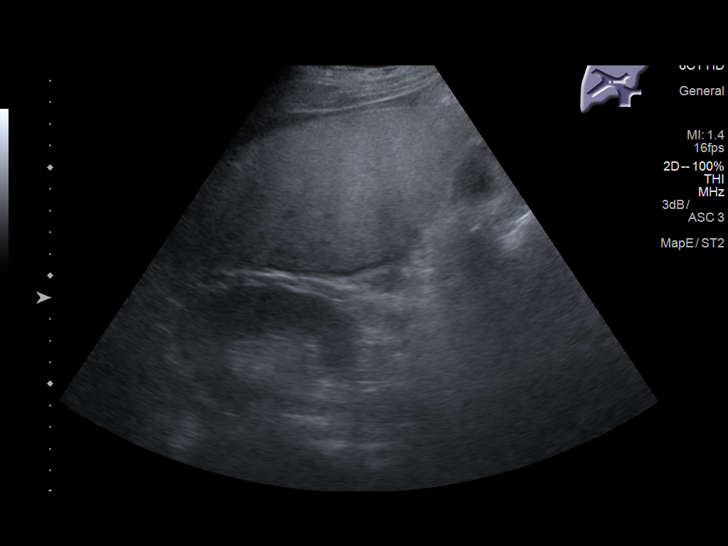
[im 35/83]
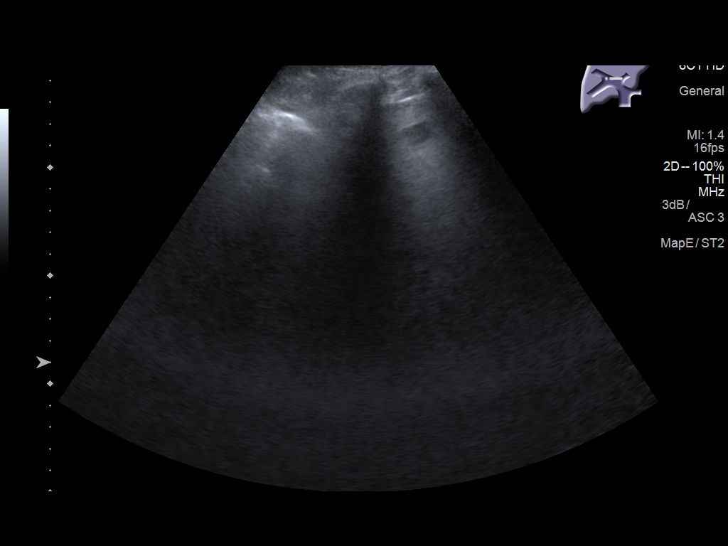
[im 42/83]
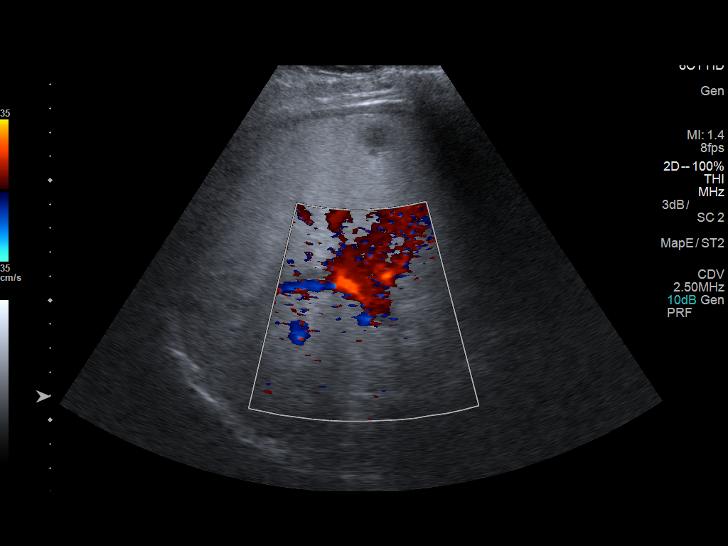
[im 48/83]
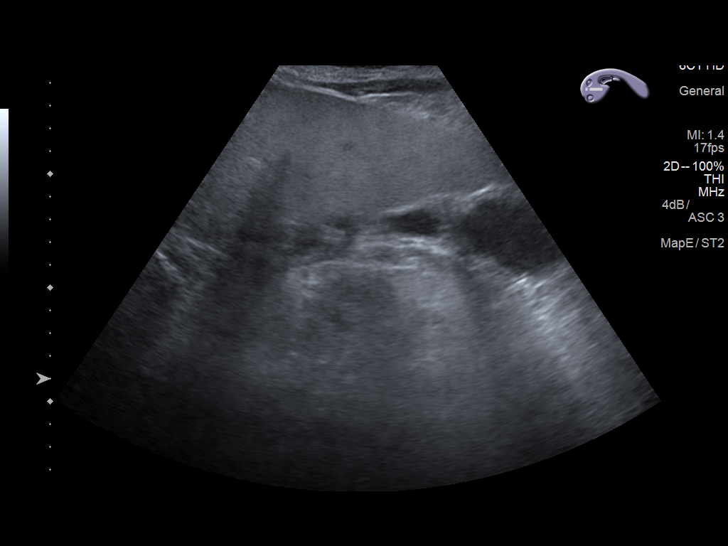
[im 55/83]
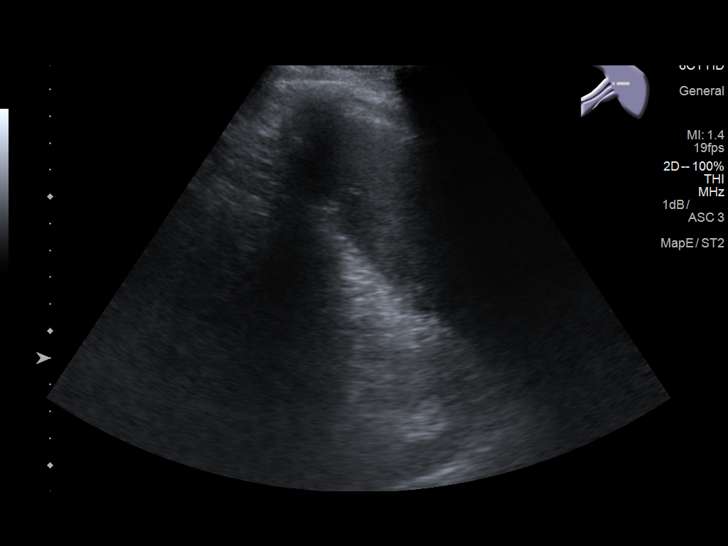
[im 62/83]
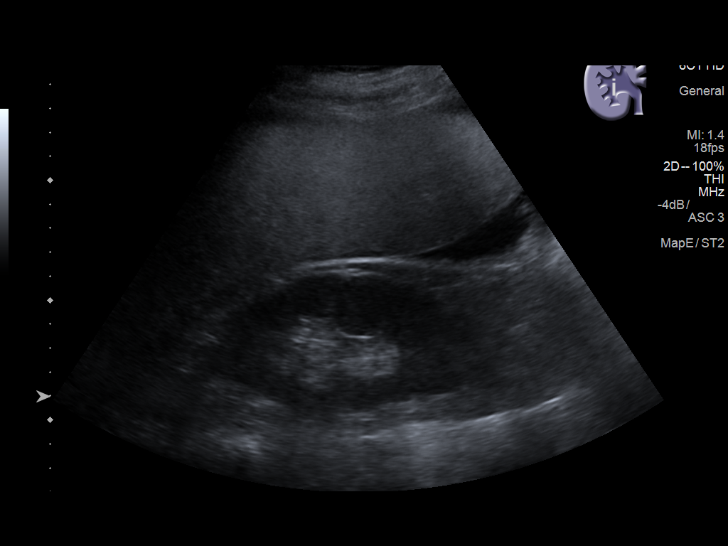
[im 69/83]
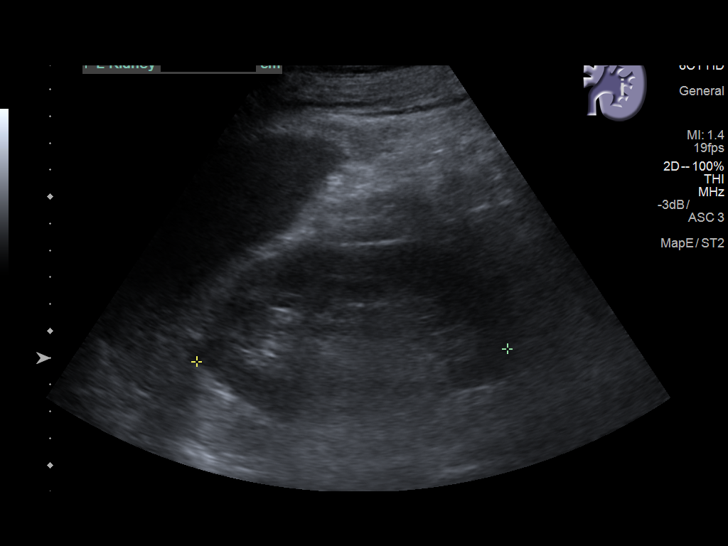
[im 76/83]
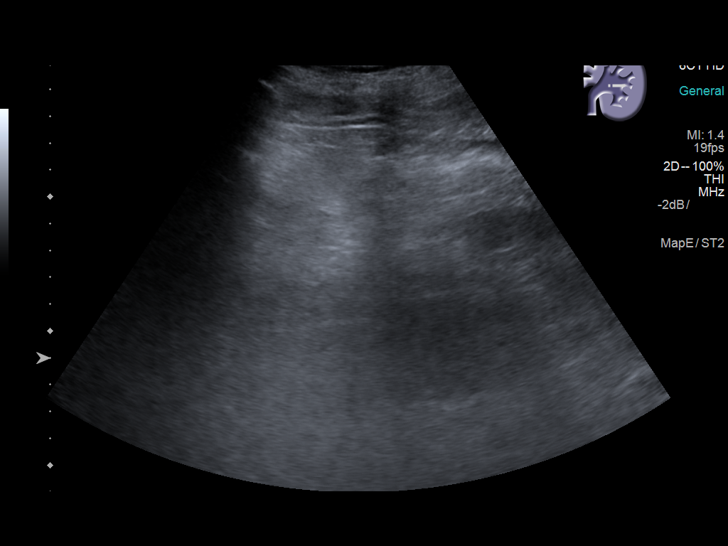
[im 83/83]
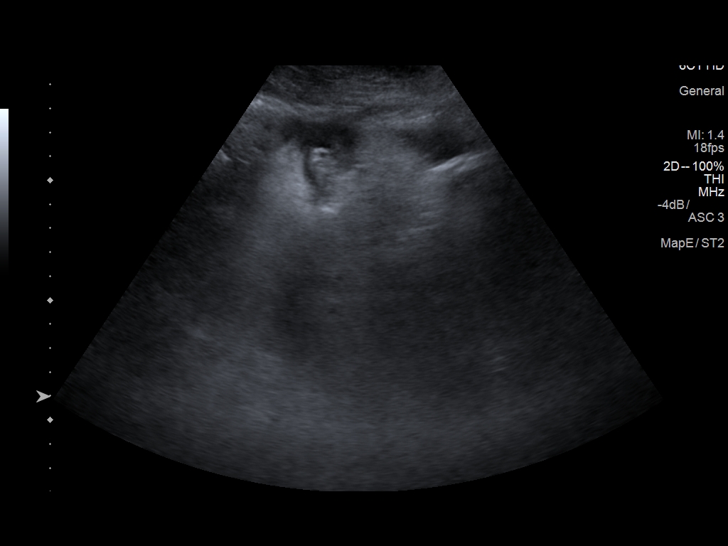

[13 of 25 positions shown; findings below may reference images not displayed]

FINDINGS: Gallbladder: Gallbladder wall thickening is noted. This is
nonspecific in the presence of ascites. Underlying sludge is noted
within the gallbladder. No definite stones are seen. No
ultrasonographic Murphy's sign is elicited.

Common bile duct: Diameter: 0.5 cm, within normal limits in caliber.

Liver: A 1.4 cm cyst is noted at the left hepatic lobe. The nodular
contour of the liver is compatible with hepatic cirrhosis. Small
volume ascites is seen tracking about the liver.

IVC: Not well characterized.

Pancreas: Not well seen.

Spleen: Size and appearance within normal limits.

Right Kidney: Length: 12.8 cm. Echogenicity within normal limits. No
mass or hydronephrosis visualized.

Left Kidney: Length: 11.6 cm. Echogenicity within normal limits. No
mass or hydronephrosis visualized.

Abdominal aorta: Not characterized due to the patient's habitus.

Other findings: None.
IMPRESSION: 1. Small volume ascites noted about the liver. Nodular contour of
the liver is compatible with hepatic cirrhosis.
2. 1.4 cm cyst at the left hepatic lobe.
3. Gallbladder wall thickening is nonspecific in the presence of
ascites. Underlying gallbladder sludge noted. No definite stones
seen.
